# Patient Record
Sex: Female | Born: 1975 | Race: Black or African American | Hispanic: No | Smoking: Current every day smoker
Health system: Southern US, Community
[De-identification: ages and names within clinical notes are randomized; demographics above are authoritative.]

## PROBLEM LIST (undated history)

## (undated) DIAGNOSIS — R519 Headache, unspecified: Secondary | ICD-10-CM

## (undated) DIAGNOSIS — I10 Essential (primary) hypertension: Secondary | ICD-10-CM

## (undated) DIAGNOSIS — F419 Anxiety disorder, unspecified: Secondary | ICD-10-CM

## (undated) DIAGNOSIS — Z9119 Patient's noncompliance with other medical treatment and regimen: Secondary | ICD-10-CM

## (undated) DIAGNOSIS — Z91199 Patient's noncompliance with other medical treatment and regimen due to unspecified reason: Secondary | ICD-10-CM

## (undated) DIAGNOSIS — R51 Headache: Secondary | ICD-10-CM

## (undated) HISTORY — PX: TUBAL LIGATION: SHX77

---

## 2002-04-29 ENCOUNTER — Encounter: Payer: Self-pay | Admitting: *Deleted

## 2002-04-29 ENCOUNTER — Ambulatory Visit (HOSPITAL_COMMUNITY): Admission: RE | Admit: 2002-04-29 | Discharge: 2002-04-29 | Payer: Self-pay | Admitting: *Deleted

## 2002-09-19 ENCOUNTER — Inpatient Hospital Stay (HOSPITAL_COMMUNITY): Admission: AD | Admit: 2002-09-19 | Discharge: 2002-09-21 | Payer: Self-pay | Admitting: *Deleted

## 2002-11-11 ENCOUNTER — Ambulatory Visit (HOSPITAL_COMMUNITY): Admission: RE | Admit: 2002-11-11 | Discharge: 2002-11-11 | Payer: Self-pay | Admitting: Obstetrics and Gynecology

## 2004-02-27 ENCOUNTER — Emergency Department (HOSPITAL_COMMUNITY): Admission: EM | Admit: 2004-02-27 | Discharge: 2004-02-28 | Payer: Self-pay | Admitting: *Deleted

## 2005-05-26 ENCOUNTER — Emergency Department (HOSPITAL_COMMUNITY): Admission: EM | Admit: 2005-05-26 | Discharge: 2005-05-26 | Payer: Self-pay | Admitting: Emergency Medicine

## 2006-04-18 ENCOUNTER — Emergency Department (HOSPITAL_COMMUNITY): Admission: EM | Admit: 2006-04-18 | Discharge: 2006-04-18 | Payer: Self-pay | Admitting: Emergency Medicine

## 2006-08-20 ENCOUNTER — Emergency Department (HOSPITAL_COMMUNITY): Admission: EM | Admit: 2006-08-20 | Discharge: 2006-08-20 | Payer: Self-pay | Admitting: Emergency Medicine

## 2010-07-09 NOTE — Discharge Summary (Signed)
   Briana Soto, Briana Soto                     ACCOUNT NO.:  [id number]   MEDICAL RECORD NO.:  [id number]                   PATIENT TYPE:  INP   LOCATION:  A417                                 FACILITY:  [facility]   PHYSICIAN:  Langley Gauss, M.D.                DATE OF BIRTH:  07/08/1975   DATE OF ADMISSION:  09/19/2002  DATE OF DISCHARGE:  09/21/2002                                 DISCHARGE SUMMARY   DISCHARGE DIAGNOSIS:  1. Term pregnancy delivered.   PROCEDURE:  1. Foley bulb  2. Vaginal delivery on September 20, 2002.   DISPOSITION:  This patient is given standardized discharge instructions with  follow up in the office in 3 weeks time. She is interested in scheduling  permanent sterilization at that time.   DISCHARGE MEDICATIONS:  1. Ambien for therapeutic rest.  2. Darvocet for postpartum cramping.   PERTINENT LABORATORY STUDIES:  Hemoglobin and hematocrit 13.5/38.5 with a  white count of 20.2; this is on postpartum day #1.  Admission hemoglobin  14.6, hematocrit 42.5 with a white count of 8.8.   HOSPITAL COURSE:  See previous dictations.  The patient had Foley bulb  placed on September 19, 2002 for mechanical ripening of the cervix.  She  underwent vaginal delivery on September 20, 2002.  Received during the course of  the labor is IV narcotics only.   Postpartum the patient did well.  She bonded well with the infant, had no  postpartum complications and, likewise, was prepared for discharge on  postpartum day #1-1/2.  Thus discharged on September 21, 2002.                                               Langley Gauss, M.D.    DC/MEDQ  D:  09/22/2002  T:  09/23/2002  Job:  [id number]

## 2010-07-09 NOTE — Op Note (Signed)
NAMELAMIS, BEHRMANN NO.:  [id number]   MEDICAL RECORD NO.:  [id number]                  PATIENT TYPE:   LOCATION:                                       FACILITY:   PHYSICIAN:  Langley Gauss, M.D.                DATE OF BIRTH:   DATE OF PROCEDURE:  09/20/2002  DATE OF DISCHARGE:                                 OPERATIVE REPORT   INDICATIONS FOR PROCEDURE:  A 38-week intrauterine pregnancy for induction  of labor.   DELIVERY:  Spontaneous assisted vaginal delivery of a 7 pound 0 ounce female  infant delivered over an intact perineum with no lacerations performed.  Delivery performed by Langley Gauss, M.D.   ESTIMATED BLOOD LOSS:  Less than 500 cc.   SPECIMENS:  Arterial cord gas and cord blood to pathology laboratory.  The  placenta was examined and noted to be apparently intact with a three-vessel  umbilical cord.   FINDINGS AT THE TIME OF DELIVERY:  A compound presentation with the right  hand noted to be adjacent to the presenting vertex.  In addition, a loose  nuchal cord x1 with compression during the second stage of labor was reduced  prior to delivery of the infant.   ANALGESIA FOR DELIVERY:  The patient received only IV narcotics during the  course of labor.   SUMMARY:  The patient admitted for induction of labor.  Foley bulb initially  placed.  After removal of the Foley bulb catheter, the patient was noted to  be 3 cm dilated per examination by the nursing staff.  Their initial  impression was that spontaneous rupture of membranes had occured at the time  of removal of the Foley bulb; however, no further leakage of fluid was  identified.  Pitocin induction was thereafter initiated.  When I examined  the patient, she was noted to have bulging, intact membranes at 6 cm  dilatation.  Amniotomy was performed at that time, with the findings of  clear amniotic fluid, and a fetal scalp electrode was placed.  The patient  had a  reassuring fetal heart rate.  She did receive only IV narcotics during  the course of labor.  At 8 cm dilation and in the transitional phase of  labor, the patient had intermittent moderate variable decelerations.  She  thereafter progressed rapidly to complete dilatation and began having an  urge to push.  She was placed in the dorsal lithotomy position and prepped  and draped in the usual sterile manner.  She pushed very well during this  short second stage of labor, delivering in a direct OA position with a  compound presentation as previously noted, the right arm being adjacent to  the head with the hand adjacent to the fetal head.  Delivery itself was  atraumatic, with no apparent injury to the arm or hand.  The vertex  delivered in a direct OA position.  The mouth  and nose were bulb suctioned  of clear amniotic fluid.  The right arm delivered at the same time.  Spontaneous rotation occured to a right anterior shoulder position.  Gentle  downward traction combined with good expulsive efforts resulted in easy  delivery of this shoulder as well as the remainder of the infant without  difficulty.  The umbilical cord was milked towards the infant.  The cord was  doubly clamped and cut, and the infant was taken to the nursery table for  immediate assessment.  The infant did initially require some bag and mask  ventilation and resuscitative efforts per the nursing staff.  Arterial cord  gas and cord blood were obtained from the placenta.  Gentle traction on the  umbilical cord resulted in separation, which upon examined appeared to be an  intact placenta with associated three-vessel umbilical cord.  Examination of  the genital tract revealed no lacerations.  Specifically, the perineum was  noted to be intact.  Excellent uterine tone was achieved  immediately following delivery of the placenta, with mother and infant doing  well following delivery.  The infant had been weighed at 7 pounds 0  ounces.  After initial resuscitative efforts, the infant did very well.  The patient  had been tested during the prenatal course.  She was noted to be negative  for group B strep carrier status.                                               Langley Gauss, M.D.    DC/MEDQ  D:  09/20/2002  T:  09/20/2002  Job:  [id number]

## 2010-07-09 NOTE — H&P (Signed)
   [name], [name] NO.:  [id number]   MEDICAL RECORD NO.:  [id number]                  PATIENT TYPE:   LOCATION:                                       FACILITY:   PHYSICIAN:  Tilda Burrow, M.D.              DATE OF BIRTH:   DATE OF ADMISSION:  11/11/2002  DATE OF DISCHARGE:                                HISTORY & PHYSICAL   ADMISSION DIAGNOSIS:  Desire for elective permanent sterilization.   HISTORY OF PRESENT ILLNESS:  This 35 year old female, G4, P4, now five weeks  since her most recent vaginal delivery has recently transferred Soto our  practice and requests permanent sterilization.  The patient has been seen in  our office on October 16, 2002, after having delivered on September 20, 2002, at  [facility].  She has requested permanent sterilization and desires  Soto proceed at this time with permanent technique.  Failure rate of 1/100 has  been reviewed with the patient previously being cared for Dr. Lisette Grinder and  listing herself as referred here by Dr. Lisette Grinder.  Permanency of procedure is  clearly defined Soto the patient who acknowledges this with a failure rate of  1/100 quoted Soto patient.  Tubal sterilization form signed September 25, 2002.   PAST MEDICAL HISTORY:  Mild postpartum hypertension which resolved after  observation.  She was offered Diazide diuretic and declined the offer in  August, and it has now spontaneously resolved.   PAST SURGICAL HISTORY:  Negative.   ALLERGIES:  CODEINE causes nausea.   MEDICATIONS:  None at present.   PHYSICAL EXAMINATION:  VITAL SIGNS:  Height 5 feet 3 inches, weight 131,  blood pressure 118/64, pulse 72.  GENERAL:  Healthy, slim, African-American female, alert and oriented x3.  HEENT:  Pupils equal round and reactive Soto light.  Extraocular movements  intact.  NECK:  Supple.  Trachea midline.  CHEST:  Clear Soto auscultation.  ABDOMEN:  Slim without masses.  GENITALIA:  External genitalia shows  normal multiparous female.  Normal  cervix.  Multiparous uterus, anterior and nontender.  Normal size, shape and  contour.  Adnexa negative for masses.  GC and Chlamydia performed.   PLAN:  Laparoscopic tubal sterilization on November 11, 2002.                                               Tilda Burrow, M.D.    JVF/MEDQ  D:  11/05/2002  T:  11/05/2002  Job:  [id number]

## 2010-07-09 NOTE — Op Note (Signed)
   NAME:  ELEXUS, BARMAN                     ACCOUNT NO.:  [id number]   MEDICAL RECORD NO.:  [id number]                   PATIENT TYPE:  INP   LOCATION:  LDR2                                 FACILITY:  [facility]   PHYSICIAN:  Langley Gauss, M.D.                DATE OF BIRTH:  08-Nov-1975   DATE OF PROCEDURE:  09/19/2002  DATE OF DISCHARGE:                                  PROCEDURE NOTE   PROCEDURE:  Placement of Foley bulb for mechanical ripening of cervix.   DESCRIPTION OF PROCEDURE:  Appropriate informed consent obtained.  Patient  placed in the dorsal lithotomy position.  Nonstress test was noted to be  reactive.  Rare uterine contractions were identified.  Sterile speculum  examination revealed the cervix to be without any active bleeding or any  leakage of fluid.  Digital examination reveals cervix to be 3 cm dilated and  -3 station, 60% effaced, posterior with the vertex well-applied to the  cervix.  A 16 French Foley catheter is passed through the endocervical os on  the first attempt without difficulty and then inflated to a volume of 60 mL  of sterile normal saline.  It is then left in place.  The sterile speculum  is removed and the catheter is left in place.  No traction is applied.  The  fetal heart rate following the procedure remains reassuring.  No change in  the uterine tone.  No evidence of any change in the uterine contractions.  No leakage of fluid or vaginal bleeding.  Nonstress test currently in  progress.                                               Langley Gauss, M.D.    DC/MEDQ  D:  09/19/2002  T:  09/20/2002  Job:  [id number]

## 2010-07-09 NOTE — H&P (Signed)
Briana Soto, [name]                     ACCOUNT NO.:  [id number]   MEDICAL RECORD NO.:  [id number]                   PATIENT TYPE:  INP   LOCATION:  A417                                 FACILITY:  [facility]   PHYSICIAN:  Langley Gauss, M.D.                DATE OF BIRTH:  [birth year]   DATE OF ADMISSION:  09/19/2002  DATE OF DISCHARGE:  09/21/2002                                HISTORY & PHYSICAL   HISTORY OF PRESENT ILLNESS:  This is a 35 year old gravida 4, para 3, at 38+  weeks gestation who is admitted for induction of labor.  The patient is  noted to have a history of three prior rapid labor and deliveries with very  short second stage of labor.  The patient is noted to have significant  fundal lag during this pregnancy as well as weight loss during the first  trimester.  The patient's prenatal course has been uncomplicated.  She was  noted to be AB-positive blood type, antibody screen negative, AFB triple  screen within normal limits, glucose tolerance test is not performed as with  three previous pregnancies GTT was within normal limits.  The patient has  described good fetal growth and intermittent uterine contractions during the  past week.   PAST MEDICAL HISTORY:  No known drug allergies.   CURRENT MEDICATIONS:  Prenatal vitamins only.   OBSTETRICAL HISTORY:  Three prior vaginal deliveries without complications.  Very short second stage of labor.   Other past medical history and surgical history is negative.   PHYSICAL EXAMINATION:  GENERAL:  No acute distress.  Black female.  VITAL SIGNS:  Height 5 feet 4, prepregnancy weight 145.  101, 20, blood  pressure 129/76.  HEENT:  Negative.  No adenopathy.  NECK:  Supple.  Thyroid is not palpable.  LUNGS:  Clear.  CARDIOVASCULAR:  Regular rate and rhythm.  ABDOMEN:  Soft and nontender.  No surgical scars are identified.  Fundal  height is 34 cm.  Vertex presentation by [employer].  EXTREMITIES:  Noted to be  within normal limits.  PELVIC:  Normal external genitalia.  No lesions or ulcerations identified.  Cervix on initial examination 3 cm dilated, -3 station, 60% effaced, and  vertex presentation.  External fetal monitor reveals no uterine  contractions.  Fetal heart rate is reaffirmed with fetal heart rate  accelerations noted, normal long-term variability, and no decelerations  noted.   ASSESSMENT:  Gravida 4, para 3, with history of rapid labor, dilated to 3  but vertex remains unengaged.    PLAN:  Thus, the plan is to proceed with Foley bulb placement for mechanical  ripening of the cervix after which time hopefully with removal there will be  a descent to the vertex and continued softening of the cervix at which time  we can proceed with amniotomy and initiate Pitocin as clinically indicated.  Langley Gauss, M.D.    DC/MEDQ  D:  09/22/2002  T:  09/22/2002  Job:  [id number]

## 2010-07-09 NOTE — Op Note (Signed)
Briana Soto, Briana Soto                     ACCOUNT NO.:  [id number]   MEDICAL RECORD NO.:  [id number]                   PATIENT TYPE:  AMB   LOCATION:  DAY                                  FACILITY:  [facility]   PHYSICIAN:  Tilda Burrow, M.D.              DATE OF BIRTH:  [birth year]   DATE OF PROCEDURE:  11/11/2002  DATE OF DISCHARGE:  11/11/2002                                 OPERATIVE REPORT   PREOPERATIVE DIAGNOSIS:  Elective sterilization.   POSTOPERATIVE DIAGNOSIS:  Elective sterilization.   OPERATION PERFORMED:  Laparoscopic tubal sterilization with Falope rings.   SURGEON:  Tilda Burrow, M.D.   ASSISTANT:  None.   ANESTHESIA:  General.   COMPLICATIONS:  None.   ESTIMATED BLOOD LOSS:  Minimal.   INDICATION:  Elective permanent sterilization.   DETAILS OF PROCEDURE:  The patient was taken to the operating room, prepped  and  draped for a combined abdominal and vaginal procedure, with Hulka  tenaculum attached to the cervix for uterine  manipulation. Bladder in-and-  out catheterization. An infraumbilical, 1 cm vertical incision, as well as a  transverse suprapubic 1 cm incision. Veress needle was used to introduce  pneumoperitoneum through the umbilical incision with the pneumoperitoneum  easily introduced under 10 mmHg of pressure. Introduction of the Veress  needle was done, carefully elevating the abdominal wall and orienting the  needle toward the pelvis.   The laparoscopic trocar was then carefully introduced into the abdomen using  a similar technique, and the laparoscope was used to visualize normal pelvic  anatomy with no evidence of bleeding or trauma. The suprapubic trocar was  introduced under direct visualization, and then attention was directed to  the left fallopian tube, which was identified up to its fimbriated end,  elevated and a mid-segment loop of the tube was drawn up into the Falope  ring applier, Marcaine 0.25% applied to the surface  of the tube and the  Falope ring applied, inspected, and found to be in satisfactory position.  The opposite tube was then treated in a similar fashion. The mesosalpinx  beneath the Falope ring on each side was then infiltrated with approximately  3 cc of Marcaine 0.25%, using a transabdominal approach with a 22-gauge  spinal needle. Then the laparoscopic equipment was removed after instilling  200 cc of saline into  the abdomen and deflating the abdomen. Subcuticular 4-0 Dexon was used to  close the skin and Steri-Strips were placed on the skin surface. Sponge and  needle counts were correct. The patient tolerated the procedure well, was  awakened, and went to the recovery room in good condition.      ___________________________________________                                            Cyndi Lennert  Emelda Fear, M.D.   JVF/MEDQ  D:  01/05/2003  T:  01/05/2003  Job:  [id number]

## 2010-12-08 LAB — BASIC METABOLIC PANEL
BUN: 7
Calcium: 9.3
Creatinine, Ser: 0.81
GFR calc non Af Amer: >60
Glucose, Bld: 90
Potassium: 4.3

## 2010-12-08 LAB — CBC
HCT: 43.5
Platelets: 254
RDW: 12
WBC: 7.8

## 2010-12-08 LAB — WET PREP, GENITAL: Yeast Wet Prep HPF POC: NONE SEEN

## 2010-12-08 LAB — GC/CHLAMYDIA PROBE AMP, GENITAL
Chlamydia, DNA Probe: POSITIVE — AB
GC Probe Amp, Genital: POSITIVE — AB

## 2010-12-08 LAB — DIFFERENTIAL
Basophils Absolute: 0
Eosinophils Relative: 2
Lymphocytes Relative: 37
Neutro Abs: 4.2

## 2010-12-08 LAB — PREGNANCY, URINE: Preg Test, Ur: NEGATIVE

## 2012-09-29 DIAGNOSIS — Z720 Tobacco use: Secondary | ICD-10-CM | POA: Insufficient documentation

## 2012-09-29 DIAGNOSIS — I10 Essential (primary) hypertension: Secondary | ICD-10-CM | POA: Insufficient documentation

## 2012-09-29 DIAGNOSIS — F419 Anxiety disorder, unspecified: Secondary | ICD-10-CM | POA: Insufficient documentation

## 2014-09-07 ENCOUNTER — Encounter (HOSPITAL_COMMUNITY): Payer: Self-pay | Admitting: Emergency Medicine

## 2014-09-07 ENCOUNTER — Emergency Department (HOSPITAL_COMMUNITY)
Admission: EM | Admit: 2014-09-07 | Discharge: 2014-09-07 | Disposition: A | Discharge status: Home / Self Care | Payer: Medicaid Other | Attending: Emergency Medicine | Admitting: Emergency Medicine

## 2014-09-07 DIAGNOSIS — B86 Scabies: Secondary | ICD-10-CM | POA: Diagnosis not present

## 2014-09-07 DIAGNOSIS — Z720 Tobacco use: Secondary | ICD-10-CM | POA: Insufficient documentation

## 2014-09-07 DIAGNOSIS — I10 Essential (primary) hypertension: Secondary | ICD-10-CM | POA: Insufficient documentation

## 2014-09-07 DIAGNOSIS — R21 Rash and other nonspecific skin eruption: Secondary | ICD-10-CM | POA: Diagnosis present

## 2014-09-07 HISTORY — DX: Essential (primary) hypertension: I10

## 2014-09-07 MED ORDER — AMLODIPINE BESYLATE 5 MG PO TABS: 5.0000 mg | ORAL_TABLET | Freq: Every day | ORAL | Status: DC

## 2014-09-07 MED ORDER — HYDROXYZINE HCL 25 MG PO TABS: 25.0000 mg | ORAL_TABLET | Freq: Four times a day (QID) | ORAL | Status: DC

## 2014-09-07 MED ORDER — AMLODIPINE BESYLATE 5 MG PO TABS
5.0000 mg | ORAL_TABLET | Freq: Once | ORAL | Status: AC
  Administered 2014-09-07: 5 mg via ORAL
  Filled 2014-09-07: qty 1

## 2014-09-07 MED ORDER — PERMETHRIN 5 % EX CREA: TOPICAL_CREAM | CUTANEOUS | Status: DC

## 2014-09-07 NOTE — Discharge Instructions (Signed)

## 2014-09-07 NOTE — ED Provider Notes (Signed)
CSN: [id number]     Arrival date & time 09/07/14  1551 History   This chart was scribed for [name], PA-C working with [name], MD by Briana Soto, ED Scribe. This patient was seen in room APFT21/APFT21 and the patient's care was started at 4:17 PM.   Chief Complaint  Patient presents with  . Rash   The history is provided by the patient. No language interpreter was used.   HPI Comments: Briana Soto is a 39 y.o. female who presents to the Emergency Department complaining of pruritic rash for two weeks now. Patient reports initiation of rash to hands and spreading since presentation to lower legs and ankles. Patient reports irritation of the rash and sensation of biting especially at work where it is warm. Patient reports treatment with Benadryl without relief. Patient reports itching especially at a her job and when leaving and states that she was informed at the site has mites. Patient reports that everyone on the home has similar rash.   Past Medical History  Diagnosis Date  . Hypertension    History reviewed. No pertinent past surgical history. History reviewed. No pertinent family history. History  Substance Use Topics  . Smoking status: Current Every Day Smoker -- 1.00 packs/day    Types: Cigarettes  . Smokeless tobacco: Not on file  . Alcohol Use: Yes     Comment: occasionally   OB History    No data available     Review of Systems  Constitutional: Negative for fever.  Skin: Positive for rash.  All other systems reviewed and are negative.   Allergies  Codeine  Home Medications   Prior to Admission medications   Medication Sig Start Date End Date Taking? Authorizing Provider  diphenhydrAMINE (BENADRYL) 25 MG tablet Take 25 mg by mouth 2 (two) times daily as needed.   Yes Historical Provider, MD  amLODipine (NORVASC) 5 MG tablet Take 1 tablet (5 mg total) by mouth daily. 09/07/14   Hope Bunnie Pion, NP  hydrOXYzine (ATARAX/VISTARIL) 25 MG tablet Take 1 tablet  (25 mg total) by mouth every 6 (six) hours. 09/07/14   Hope Bunnie Pion, NP  permethrin (ELIMITE) 5 % cream Apply to affected area once 09/07/14   [name], NP   Triage Vitals: BP 194/134 mmHg  Pulse 101  Temp(Src) 98.3 F (36.8 C) (Oral)  Resp 14  Ht 5\' 6"  (1.676 m)  Wt 156 lb (70.761 kg)  BMI 25.19 kg/m2  SpO2 100%  LMP 09/01/2014 Physical Exam  Constitutional: She is oriented to person, place, and time. She appears well-developed and well-nourished. No distress.  HENT:  Head: Normocephalic and atraumatic.  Eyes: EOM are normal.  Neck: Neck supple. No tracheal deviation present.  Cardiovascular: Normal rate.   Pulmonary/Chest: Effort normal. No respiratory distress.  Musculoskeletal: Normal range of motion.  Neurological: She is alert and oriented to person, place, and time.  Skin: Skin is warm and dry.  Rash hands, wrists, between fingers and toes.  Psychiatric: She has a normal mood and affect. Her behavior is normal.  Nursing note and vitals reviewed.   ED Course  Procedures (including critical care time)  I discussed this case with Dr. Lacinda Axon and will refill patient's BP medication.   COORDINATION OF CARE: 4:25 PM- Discussed treatment plan with patient at bedside and patient agreed to plan.   4:30 PM Patient BP rechecked and continues to be elevated. Patient with hx of HTN and not taking her BP medication because  she moved to [place] and does not have a PCP her yet. She denies chest pain, shortness of breath, weakness or any other symptoms.  Discussed in detail with the patient problems associated with HTN. Encouraged patient to  follow up with a PCP.   MDM  39 y.o. female with rash and itching x 2 weeks and elevated BP since running out of her medication. Since patient is known hypertensive and without symptoms at this time will refill her medication and she will try to find a PCP in the area to follow her health problems.  Final diagnoses:  Scabies  Essential  hypertension   I personally performed the services described in this documentation, which was scribed in my presence. The recorded information has been reviewed and is accurate.    [address] [place], NP 09/07/14 [place], MD 09/07/14 [phone]

## 2014-09-07 NOTE — ED Notes (Signed)
Pt states that she has been itching and has had a rash for the past 2 weeks.

## 2017-01-25 ENCOUNTER — Encounter (HOSPITAL_COMMUNITY): Payer: Self-pay | Admitting: Emergency Medicine

## 2017-01-25 ENCOUNTER — Emergency Department (HOSPITAL_COMMUNITY): Payer: Self-pay

## 2017-01-25 ENCOUNTER — Other Ambulatory Visit: Payer: Self-pay

## 2017-01-25 ENCOUNTER — Emergency Department (HOSPITAL_COMMUNITY)
Admission: EM | Admit: 2017-01-25 | Discharge: 2017-01-25 | Disposition: A | Discharge status: Home / Self Care | Payer: Self-pay | Attending: Emergency Medicine | Admitting: Emergency Medicine

## 2017-01-25 DIAGNOSIS — N6320 Unspecified lump in the left breast, unspecified quadrant: Secondary | ICD-10-CM

## 2017-01-25 DIAGNOSIS — I10 Essential (primary) hypertension: Secondary | ICD-10-CM | POA: Insufficient documentation

## 2017-01-25 DIAGNOSIS — L0291 Cutaneous abscess, unspecified: Secondary | ICD-10-CM

## 2017-01-25 DIAGNOSIS — Z79899 Other long term (current) drug therapy: Secondary | ICD-10-CM | POA: Insufficient documentation

## 2017-01-25 DIAGNOSIS — N611 Abscess of the breast and nipple: Secondary | ICD-10-CM | POA: Insufficient documentation

## 2017-01-25 DIAGNOSIS — F17210 Nicotine dependence, cigarettes, uncomplicated: Secondary | ICD-10-CM | POA: Insufficient documentation

## 2017-01-25 LAB — URINALYSIS, ROUTINE W REFLEX MICROSCOPIC
BILIRUBIN URINE: NEGATIVE
GLUCOSE, UA: NEGATIVE mg/dL
KETONES UR: NEGATIVE mg/dL
Leukocytes, UA: NEGATIVE
Nitrite: NEGATIVE
PH: 5.5 (ref 5.0–8.0)
Protein, ur: NEGATIVE mg/dL

## 2017-01-25 LAB — CBC WITH DIFFERENTIAL/PLATELET
BASOS ABS: 0 10*3/uL (ref 0.0–0.1)
BASOS PCT: 1 %
EOS ABS: 0.1 10*3/uL (ref 0.0–0.7)
Eosinophils Relative: 1 %
HEMATOCRIT: 42.3 % (ref 36.0–46.0)
HEMOGLOBIN: 13.2 g/dL (ref 12.0–15.0)
Lymphocytes Relative: 27 %
Lymphs Abs: 2.2 10*3/uL (ref 0.7–4.0)
MCH: 29.8 pg (ref 26.0–34.0)
MCHC: 31.2 g/dL (ref 30.0–36.0)
MCV: 95.5 fL (ref 78.0–100.0)
Monocytes Absolute: 0.7 10*3/uL (ref 0.1–1.0)
Monocytes Relative: 8 %
NEUTROS ABS: 5.1 10*3/uL (ref 1.7–7.7)
Neutrophils Relative %: 63 %
Platelets: 362 10*3/uL (ref 150–400)
RBC: 4.43 MIL/uL (ref 3.87–5.11)
RDW: 14 % (ref 11.5–15.5)
WBC: 8.1 10*3/uL (ref 4.0–10.5)

## 2017-01-25 LAB — BASIC METABOLIC PANEL
ANION GAP: 9 (ref 5–15)
BUN: 7 mg/dL (ref 6–20)
CALCIUM: 9.6 mg/dL (ref 8.9–10.3)
CO2: 25 mmol/L (ref 22–32)
CREATININE: 0.84 mg/dL (ref 0.44–1.00)
Chloride: 105 mmol/L (ref 101–111)
GLUCOSE: 85 mg/dL (ref 65–99)
Potassium: 3.2 mmol/L — ABNORMAL LOW (ref 3.5–5.1)
Sodium: 139 mmol/L (ref 135–145)

## 2017-01-25 LAB — URINALYSIS, MICROSCOPIC (REFLEX)

## 2017-01-25 LAB — PREGNANCY, URINE: Preg Test, Ur: NEGATIVE

## 2017-01-25 MED ORDER — AMLODIPINE BESYLATE 5 MG PO TABS
5.0000 mg | ORAL_TABLET | Freq: Every day | ORAL | 0 refills | Status: DC
Start: 2017-01-25 — End: 2017-11-29

## 2017-01-25 MED ORDER — HYDROCODONE-ACETAMINOPHEN 5-325 MG PO TABS
1.0000 | ORAL_TABLET | Freq: Once | ORAL | Status: AC
  Administered 2017-01-25: 1 via ORAL
  Filled 2017-01-25: qty 1

## 2017-01-25 MED ORDER — IBUPROFEN 600 MG PO TABS: 600.0000 mg | ORAL_TABLET | Freq: Four times a day (QID) | ORAL | 0 refills | Status: DC | PRN

## 2017-01-25 MED ORDER — AMLODIPINE BESYLATE 5 MG PO TABS
5.0000 mg | ORAL_TABLET | Freq: Once | ORAL | Status: AC
  Administered 2017-01-25: 5 mg via ORAL
  Filled 2017-01-25: qty 1

## 2017-01-25 MED ORDER — CLINDAMYCIN PHOSPHATE 600 MG/50ML IV SOLN
600.0000 mg | Freq: Once | INTRAVENOUS | Status: AC
  Administered 2017-01-25: 600 mg via INTRAVENOUS
  Filled 2017-01-25: qty 50

## 2017-01-25 MED ORDER — ONDANSETRON 8 MG PO TBDP
8.0000 mg | ORAL_TABLET | Freq: Once | ORAL | Status: AC
  Administered 2017-01-25: 8 mg via ORAL
  Filled 2017-01-25: qty 1

## 2017-01-25 MED ORDER — CLINDAMYCIN HCL 150 MG PO CAPS: 300.0000 mg | ORAL_CAPSULE | Freq: Three times a day (TID) | ORAL | 0 refills | Status: DC

## 2017-01-25 MED ORDER — AMLODIPINE BESYLATE 5 MG PO TABS: 5.0000 mg | ORAL_TABLET | Freq: Every day | ORAL | 0 refills | Status: DC

## 2017-01-25 NOTE — ED Notes (Signed)
Pt taken to us12

## 2017-01-25 NOTE — ED Triage Notes (Signed)
Abscess to left breast x 3 days.

## 2017-01-25 NOTE — Discharge Instructions (Signed)
Warm compresses on/off to your breast.  Call the health dept listed to arrange a follow-up appt regarding your high blood pressure and to schedule a very important diagnostic mammogram.  Return here in 1-2 days if the redness and pain to your breast is not iproving

## 2017-01-25 NOTE — ED Notes (Signed)
Advised patient she did not obtain enough urine specimen.  Advised her we still needed a urine specimen.

## 2017-01-26 NOTE — ED Provider Notes (Signed)
[facility] EMERGENCY DEPARTMENT Provider Note   CSN: [id number] Arrival date & time: 01/25/17  1108     History   Chief Complaint Chief Complaint  Patient presents with  . Abscess    HPI Briana Soto is a 41 y.o. female.  HPI  Briana Soto is a 41 y.o. female who presents to the Emergency Department complaining of pain, redness and swelling to the left breast.  Symptoms present for 3 days.  She noticed redness and pain at the left areola that has spread.  No drainage.  Pain associated with palpation.  She denies fever, chills, lactation.  She also reports noticing a nontender mass to the lateral left breast that has been present for approximately 3 years.  She has not had this evaluated.  Denies family hx of breast CA, no previous mammogram, does not take birth control.    Pt is a smoker.      Past Medical History:  Diagnosis Date  . Hypertension     There are no active problems to display for this patient.   History reviewed. No pertinent surgical history.  OB History    No data available       Home Medications    Prior to Admission medications   Medication Sig Start Date End Date Taking? Authorizing Provider  diphenhydrAMINE (BENADRYL) 25 MG tablet Take 25 mg by mouth 2 (two) times daily as needed.   Yes [provider]  amLODipine (NORVASC) 5 MG tablet Take 1 tablet (5 mg total) by mouth daily. 01/25/17   Eldra Word, PA-C  clindamycin (CLEOCIN) 150 MG capsule Take 2 capsules (300 mg total) by mouth 3 (three) times daily. For 7 days 01/25/17   Briell Paulette, PA-C  hydrOXYzine (ATARAX/VISTARIL) 25 MG tablet Take 1 tablet (25 mg total) by mouth every 6 (six) hours. Patient not taking: Reported on 01/25/2017 09/07/14   [name], NP  ibuprofen (ADVIL,MOTRIN) 600 MG tablet Take 1 tablet (600 mg total) by mouth every 6 (six) hours as needed. Take with food 01/25/17   Lavaeh Bau, PA-C  permethrin (ELIMITE) 5 % cream Apply to affected  area once Patient not taking: Reported on 01/25/2017 09/07/14   [name], NP    Family History No family history on file.  Social History Social History   Tobacco Use  . Smoking status: Current Every Day Smoker    Packs/day: 1.00    Types: Cigarettes  Substance Use Topics  . Alcohol use: Yes    Comment: occasionally  . Drug use: No     Allergies   Codeine   Review of Systems Review of Systems  Constitutional: Negative for chills and fever.  Respiratory: Negative for chest tightness and shortness of breath.   Gastrointestinal: Negative for nausea and vomiting.  Musculoskeletal: Negative for arthralgias and joint swelling.  Skin: Positive for color change.       Redness, pain left breast  Hematological: Negative for adenopathy.  All other systems reviewed and are negative.    Physical Exam Updated Vital Signs BP (!) 200/126   Pulse 78   Temp 98.2 F (36.8 C)   Resp 17   Ht 5\' 4"  (1.626 m)   Wt 55.3 kg (122 lb)   LMP 01/25/2017   SpO2 99%   BMI 20.94 kg/m   Physical Exam  Constitutional: She is oriented to person, place, and time. She appears well-developed and well-nourished. No distress.  HENT:  Head: Normocephalic and atraumatic.  Cardiovascular: Normal rate, regular rhythm and normal heart sounds.  No murmur heard. Pulmonary/Chest: Effort normal and breath sounds normal. No respiratory distress.  Focal erythema and induration of the left areola. No drainage or fluctuance.  Pt also noted to have a non-tender 3 cm firm mobile mass to the lateral left breast.    Abdominal: Soft. There is no tenderness.  Musculoskeletal: Normal range of motion.  Lymphadenopathy:    She has no cervical adenopathy.       Left axillary: No pectoral and no lateral adenopathy present. Neurological: She is alert and oriented to person, place, and time. She exhibits normal muscle tone. Coordination normal.  Skin: Skin is warm and dry. Capillary refill takes less than 2  seconds. No rash noted.  Psychiatric: She has a normal mood and affect.  Nursing note and vitals reviewed.    ED Treatments / Results  Labs (all labs ordered are listed, but only abnormal results are displayed) Labs Reviewed  BASIC METABOLIC PANEL - Abnormal; Notable for the following components:      Result Value   Potassium 3.2 (*)    All other components within normal limits  URINALYSIS, ROUTINE W REFLEX MICROSCOPIC - Abnormal; Notable for the following components:   Color, Urine STRAW (*)    Specific Gravity, Urine <1.005 (*)    Hgb urine dipstick LARGE (*)    All other components within normal limits  URINALYSIS, MICROSCOPIC (REFLEX) - Abnormal; Notable for the following components:   Bacteria, UA RARE (*)    Squamous Epithelial / LPF 0-5 (*)    All other components within normal limits  CBC WITH DIFFERENTIAL/PLATELET  PREGNANCY, URINE    EKG  EKG Interpretation None       Radiology US Breast Ltd Uni Left Inc Axilla  Result Date: 01/25/2017 CLINICAL DATA:  41 year old female with focal left breast pain, redness and occasional drainage. EXAM: ULTRASOUND OF THE LEFT BREAST COMPARISON:  None FINDINGS: Targeted ultrasound is performed, showing a 2.3 x 2.1 x 2.3 cm complex cystic structure/collection at the 3 o'clock position of the retroareolar left breast, likely representing an abscess. IMPRESSION: 2.3 x 2.1 x 2.3 cm complex cystic structure/collection in the outer retroareolar left breast -likely representing an abscess. Clinical follow-up and left breast ultrasound follow-up in 7-10 days recommended following appropriate treatment. Bilateral diagnostic mammograms at that time also recommended if not recently performed. RECOMMENDATION: Left breast ultrasound and bilateral diagnostic mammograms (if not recently performed) in 7-10 days. Electronically Signed   By: Margarette Canada M.D.   On: 01/25/2017 12:57    Procedures Procedures (including critical care time)  Medications  Ordered in ED Medications  HYDROcodone-acetaminophen (NORCO/VICODIN) 5-325 MG per tablet 1 tablet (1 tablet Oral Given 01/25/17 1250)  ondansetron (ZOFRAN-ODT) disintegrating tablet 8 mg (8 mg Oral Given 01/25/17 1250)  clindamycin (CLEOCIN) IVPB 600 mg (0 mg Intravenous Stopped 01/25/17 1513)  amLODipine (NORVASC) tablet 5 mg (5 mg Oral Given 01/25/17 1512)     Initial Impression / Assessment and Plan / ED Course  I have reviewed the triage vital signs and the nursing notes.  Pertinent labs & imaging results that were available during my care of the patient were reviewed by me and considered in my medical decision making (see chart for details).     Pt well appearing.  Non-toxic.  Pt hypertensive during ED stay.  Has hx of HTN, but has not taken BP medications in 2 years due to lack of funds and no PCP.  Denies sx's other than breast pain  I have discussed at length the importance of further eval of breast mass and need for diagnostic mammo.  Pt given references for local GYN and PCP to establish care.  I have also advised her to contact [facility] to arrange assistance with getting mammo and ER return in 2 days for recheck of abscess.  Pt agrees to plan.  Will start her back on her Norvasc.    Final Clinical Impressions(s) / ED Diagnoses   Final diagnoses:  Abscess  Abscess of breast  Mass of breast, left  Essential hypertension    ED Discharge Orders        Ordered    clindamycin (CLEOCIN) 150 MG capsule  3 times daily,   Status:  Discontinued     01/25/17 1539    ibuprofen (ADVIL,MOTRIN) 600 MG tablet  Every 6 hours PRN,   Status:  Discontinued     01/25/17 1539    amLODipine (NORVASC) 5 MG tablet  Daily,   Status:  Discontinued     01/25/17 1539    amLODipine (NORVASC) 5 MG tablet  Daily     01/25/17 1549    clindamycin (CLEOCIN) 150 MG capsule  3 times daily     01/25/17 1549    ibuprofen (ADVIL,MOTRIN) 600 MG tablet  Every 6 hours PRN     01/25/17 1549        Delcia Spitzley, Lynelle Smoke, PA-C 01/26/17 2216    Isla Pence, MD 01/29/17 0800

## 2017-11-29 ENCOUNTER — Observation Stay (HOSPITAL_COMMUNITY)
Admission: EM | Admit: 2017-11-29 | Discharge: 2017-12-01 | Disposition: A | Discharge status: Home / Self Care | Payer: Self-pay | Attending: Internal Medicine | Admitting: Internal Medicine

## 2017-11-29 ENCOUNTER — Other Ambulatory Visit: Payer: Self-pay

## 2017-11-29 ENCOUNTER — Emergency Department (HOSPITAL_COMMUNITY): Payer: Self-pay

## 2017-11-29 ENCOUNTER — Encounter (HOSPITAL_COMMUNITY): Payer: Self-pay | Admitting: Emergency Medicine

## 2017-11-29 DIAGNOSIS — F419 Anxiety disorder, unspecified: Secondary | ICD-10-CM | POA: Insufficient documentation

## 2017-11-29 DIAGNOSIS — F17210 Nicotine dependence, cigarettes, uncomplicated: Secondary | ICD-10-CM | POA: Insufficient documentation

## 2017-11-29 DIAGNOSIS — Z885 Allergy status to narcotic agent status: Secondary | ICD-10-CM | POA: Insufficient documentation

## 2017-11-29 DIAGNOSIS — R000 Tachycardia, unspecified: Secondary | ICD-10-CM | POA: Diagnosis present

## 2017-11-29 DIAGNOSIS — I161 Hypertensive emergency: Secondary | ICD-10-CM

## 2017-11-29 DIAGNOSIS — E876 Hypokalemia: Secondary | ICD-10-CM | POA: Diagnosis present

## 2017-11-29 DIAGNOSIS — I160 Hypertensive urgency: Principal | ICD-10-CM | POA: Diagnosis present

## 2017-11-29 DIAGNOSIS — Z9114 Patient's other noncompliance with medication regimen: Secondary | ICD-10-CM

## 2017-11-29 DIAGNOSIS — R251 Tremor, unspecified: Secondary | ICD-10-CM | POA: Insufficient documentation

## 2017-11-29 DIAGNOSIS — Z79899 Other long term (current) drug therapy: Secondary | ICD-10-CM | POA: Insufficient documentation

## 2017-11-29 DIAGNOSIS — Z91120 Patient's intentional underdosing of medication regimen due to financial hardship: Secondary | ICD-10-CM | POA: Insufficient documentation

## 2017-11-29 DIAGNOSIS — I10 Essential (primary) hypertension: Secondary | ICD-10-CM | POA: Insufficient documentation

## 2017-11-29 HISTORY — DX: Patient's noncompliance with other medical treatment and regimen: Z91.19

## 2017-11-29 HISTORY — DX: Headache: R51

## 2017-11-29 HISTORY — DX: Patient's noncompliance with other medical treatment and regimen due to unspecified reason: Z91.199

## 2017-11-29 HISTORY — DX: Headache, unspecified: R51.9

## 2017-11-29 LAB — MAGNESIUM: Magnesium: 2.1 mg/dL (ref 1.7–2.4)

## 2017-11-29 LAB — CBC WITH DIFFERENTIAL/PLATELET
ABS IMMATURE GRANULOCYTES: 0.01 10*3/uL (ref 0.00–0.07)
Basophils Absolute: 0 10*3/uL (ref 0.0–0.1)
Basophils Relative: 1 %
Eosinophils Absolute: 0.1 10*3/uL (ref 0.0–0.5)
Eosinophils Relative: 1 %
HEMATOCRIT: 41.9 % (ref 36.0–46.0)
Hemoglobin: 13 g/dL (ref 12.0–15.0)
IMMATURE GRANULOCYTES: 0 %
LYMPHS ABS: 2.1 10*3/uL (ref 0.7–4.0)
Lymphocytes Relative: 41 %
MCH: 27.5 pg (ref 26.0–34.0)
MCHC: 31 g/dL (ref 30.0–36.0)
MCV: 88.8 fL (ref 80.0–100.0)
MONO ABS: 0.6 10*3/uL (ref 0.1–1.0)
MONOS PCT: 12 %
NEUTROS ABS: 2.4 10*3/uL (ref 1.7–7.7)
NEUTROS PCT: 45 %
Platelets: 334 10*3/uL (ref 150–400)
RBC: 4.72 MIL/uL (ref 3.87–5.11)
RDW: 15.1 % (ref 11.5–15.5)
WBC: 5.2 10*3/uL (ref 4.0–10.5)
nRBC: 0 % (ref 0.0–0.2)

## 2017-11-29 LAB — BASIC METABOLIC PANEL
Anion gap: 9 (ref 5–15)
BUN: 7 mg/dL (ref 6–20)
CALCIUM: 8.6 mg/dL — AB (ref 8.9–10.3)
CHLORIDE: 108 mmol/L (ref 98–111)
CO2: 24 mmol/L (ref 22–32)
CREATININE: 0.74 mg/dL (ref 0.44–1.00)
GFR calc non Af Amer: >60 mL/min (ref >60)
Glucose, Bld: 83 mg/dL (ref 70–99)
Potassium: 3.3 mmol/L — ABNORMAL LOW (ref 3.5–5.1)
SODIUM: 141 mmol/L (ref 135–145)

## 2017-11-29 LAB — URINALYSIS, ROUTINE W REFLEX MICROSCOPIC
Bilirubin Urine: NEGATIVE
GLUCOSE, UA: NEGATIVE mg/dL
HGB URINE DIPSTICK: NEGATIVE
Ketones, ur: NEGATIVE mg/dL
LEUKOCYTES UA: NEGATIVE
Nitrite: NEGATIVE
PROTEIN: NEGATIVE mg/dL
Specific Gravity, Urine: 1.002 — ABNORMAL LOW (ref 1.005–1.030)
pH: 5 (ref 5.0–8.0)

## 2017-11-29 LAB — PREGNANCY, URINE: Preg Test, Ur: NEGATIVE

## 2017-11-29 LAB — TSH: TSH: 0.565 u[IU]/mL (ref 0.350–4.500)

## 2017-11-29 LAB — TROPONIN I: Troponin I: <0.03 ng/mL (ref <0.03)

## 2017-11-29 LAB — BRAIN NATRIURETIC PEPTIDE: B NATRIURETIC PEPTIDE 5: 48 pg/mL (ref 0.0–100.0)

## 2017-11-29 MED ORDER — NICOTINE 21 MG/24HR TD PT24
21.0000 mg | MEDICATED_PATCH | Freq: Every day | TRANSDERMAL | Status: DC | PRN
  Administered 2017-11-29 – 2017-12-01 (×2): 21 mg via TRANSDERMAL
  Filled 2017-11-29 (×2): qty 1

## 2017-11-29 MED ORDER — DIPHENHYDRAMINE HCL 25 MG PO TABS
25.0000 mg | ORAL_TABLET | Freq: Two times a day (BID) | ORAL | Status: DC | PRN
  Administered 2017-11-29: 25 mg via ORAL
  Filled 2017-11-29 (×3): qty 1

## 2017-11-29 MED ORDER — ACETAMINOPHEN 325 MG PO TABS
650.0000 mg | ORAL_TABLET | Freq: Four times a day (QID) | ORAL | Status: DC | PRN
  Administered 2017-11-30 (×2): 650 mg via ORAL
  Filled 2017-11-29 (×2): qty 2

## 2017-11-29 MED ORDER — SODIUM CHLORIDE 0.9% FLUSH: 3.0000 mL | INTRAVENOUS | Status: DC | PRN

## 2017-11-29 MED ORDER — SODIUM CHLORIDE 0.9 % IV SOLN: 250.0000 mL | INTRAVENOUS | Status: DC | PRN

## 2017-11-29 MED ORDER — HYDROCHLOROTHIAZIDE 12.5 MG PO CAPS
12.5000 mg | ORAL_CAPSULE | Freq: Every day | ORAL | Status: DC
  Administered 2017-11-29 – 2017-11-30 (×2): 12.5 mg via ORAL
  Filled 2017-11-29 (×2): qty 1

## 2017-11-29 MED ORDER — ENOXAPARIN SODIUM 40 MG/0.4ML ~~LOC~~ SOLN
40.0000 mg | SUBCUTANEOUS | Status: DC
  Administered 2017-11-29 – 2017-11-30 (×2): 40 mg via SUBCUTANEOUS
  Filled 2017-11-29 (×2): qty 0.4

## 2017-11-29 MED ORDER — SODIUM CHLORIDE 0.9% FLUSH
3.0000 mL | Freq: Two times a day (BID) | INTRAVENOUS | Status: DC
  Administered 2017-11-30 (×2): 3 mL via INTRAVENOUS

## 2017-11-29 MED ORDER — LABETALOL HCL 200 MG PO TABS
100.0000 mg | ORAL_TABLET | Freq: Two times a day (BID) | ORAL | Status: DC
  Administered 2017-11-29 – 2017-12-01 (×4): 100 mg via ORAL
  Filled 2017-11-29 (×4): qty 1

## 2017-11-29 MED ORDER — POTASSIUM CHLORIDE CRYS ER 20 MEQ PO TBCR
20.0000 meq | EXTENDED_RELEASE_TABLET | Freq: Once | ORAL | Status: AC
  Administered 2017-11-29: 20 meq via ORAL
  Filled 2017-11-29: qty 1

## 2017-11-29 MED ORDER — HYDRALAZINE HCL 20 MG/ML IJ SOLN
10.0000 mg | Freq: Four times a day (QID) | INTRAMUSCULAR | Status: DC | PRN
  Administered 2017-11-30 – 2017-12-01 (×2): 10 mg via INTRAVENOUS
  Filled 2017-11-29 (×2): qty 1

## 2017-11-29 MED ORDER — LABETALOL HCL 5 MG/ML IV SOLN
10.0000 mg | Freq: Once | INTRAVENOUS | Status: AC
  Administered 2017-11-29: 10 mg via INTRAVENOUS
  Filled 2017-11-29: qty 4

## 2017-11-29 MED ORDER — ACETAMINOPHEN 650 MG RE SUPP: 650.0000 mg | Freq: Four times a day (QID) | RECTAL | Status: DC | PRN

## 2017-11-29 NOTE — ED Provider Notes (Signed)
[facility] EMERGENCY DEPARTMENT Provider Note   CSN: [id number] Arrival date & time: 11/29/17  1828     History   Chief Complaint Chief Complaint  Patient presents with  . Hypertension    HPI Briana Soto is a 42 y.o. female.  HPI Pt was seen at 2005. Per pt and her family, c/o gradual onset and persistence of "not feeling well" for the past several months, worse over the past week. Pt cannot be more specific regarding her complaint. Pt states she has been out of her BP meds "for at least a year." Pt states her "feet are swollen."  Pt states she was evaluated by a doctor this morning "to go over my mammogram" and was told her BP was very high. Pt denies CP/SOB, no abd pain, no N/V/D, no fevers, no rash, no focal motor weakness, no tingling/numbness in extremities, no visual changes.   Past Medical History:  Diagnosis Date  . Headache   . Hypertension   . Noncompliance     There are no active problems to display for this patient.   Past Surgical History:  Procedure Laterality Date  . TUBAL LIGATION       OB History   None      Home Medications    Prior to Admission medications   Medication Sig Start Date End Date Taking? Authorizing Provider  amLODipine (NORVASC) 5 MG tablet Take 1 tablet (5 mg total) by mouth daily. 01/25/17   Triplett, Tammy, PA-C  clindamycin (CLEOCIN) 150 MG capsule Take 2 capsules (300 mg total) by mouth 3 (three) times daily. For 7 days 01/25/17   Kem Parkinson, PA-C  diphenhydrAMINE (BENADRYL) 25 MG tablet Take 25 mg by mouth 2 (two) times daily as needed.    [provider]  hydrOXYzine (ATARAX/VISTARIL) 25 MG tablet Take 1 tablet (25 mg total) by mouth every 6 (six) hours. Patient not taking: Reported on 01/25/2017 09/07/14   [name], NP  ibuprofen (ADVIL,MOTRIN) 600 MG tablet Take 1 tablet (600 mg total) by mouth every 6 (six) hours as needed. Take with food 01/25/17   Triplett, Tammy, PA-C  permethrin (ELIMITE) 5 %  cream Apply to affected area once Patient not taking: Reported on 01/25/2017 09/07/14   [name], NP    Family History History reviewed. No pertinent family history.  Social History Social History   Tobacco Use  . Smoking status: Current Every Day Smoker    Packs/day: 1.00    Types: Cigarettes  . Smokeless tobacco: Never Used  Substance Use Topics  . Alcohol use: Yes    Comment: at least 2 beers daily  . Drug use: No     Allergies   Codeine   Review of Systems Review of Systems ROS: Statement: All systems negative except as marked or noted in the HPI; Constitutional: Negative for fever and chills. ; ; Eyes: Negative for eye pain, redness and discharge. ; ; ENMT: Negative for ear pain, hoarseness, nasal congestion, sinus pressure and sore throat. ; ; Cardiovascular: Negative for chest pain, palpitations, diaphoresis, dyspnea and peripheral edema. ; ; Respiratory: Negative for cough, wheezing and stridor. ; ; Gastrointestinal: Negative for nausea, vomiting, diarrhea, abdominal pain, blood in stool, hematemesis, jaundice and rectal bleeding. . ; ; Genitourinary: Negative for dysuria, flank pain and hematuria. ; ; Musculoskeletal: Negative for back pain and neck pain. Negative for swelling and trauma.; ; Skin: Negative for pruritus, rash, abrasions, blisters, bruising and skin lesion.; ; Neuro: Negative  for headache, lightheadedness and neck stiffness. Negative for weakness, altered level of consciousness, altered mental status, extremity weakness, paresthesias, involuntary movement, seizure and syncope.       Physical Exam Updated Vital Signs BP (!) 211/128 (BP Location: Right Arm)   Pulse (!) 102   Temp 98.2 F (36.8 C) (Oral)   Resp 16   Ht 5\' 6"  (1.676 m)   Wt 56.7 kg   LMP 11/02/2017   SpO2 96%   BMI 20.18 kg/m   Physical Exam 2010: Physical examination:  Nursing notes reviewed; Vital signs and O2 SAT reviewed;  Constitutional: Well developed, Well nourished, Well  hydrated, In no acute distress; Head:  Normocephalic, atraumatic; Eyes: EOMI, PERRL, No scleral icterus. No conjunctival injection. +bilateral periorbital edema.; ENMT: Mouth and pharynx normal, Mucous membranes moist; Neck: Supple, Full range of motion, No lymphadenopathy; Cardiovascular: Regular rate and rhythm, No gallop; Respiratory: Breath sounds clear & equal bilaterally, No wheezes.  Speaking full sentences with ease, Normal respiratory effort/excursion; Chest: Nontender, Movement normal; Abdomen: Soft, Nontender, Nondistended, Normal bowel sounds; Genitourinary: No CVA tenderness; Extremities: Peripheral pulses normal, No tenderness, +tr pedal edema bilat. No calf edema or asymmetry.; Neuro: AA&Ox3, Major CN grossly intact. No facial droop. Speech clear. No gross focal motor or sensory deficits in extremities.; Skin: Color normal, Warm, Dry.   ED Treatments / Results  Labs (all labs ordered are listed, but only abnormal results are displayed)   EKG EKG Interpretation  Date/Time:  Wednesday November 29 2017 20:10:38 EDT Ventricular Rate:  87 PR Interval:    QRS Duration: 88 QT Interval:  390 QTC Calculation: 470 R Axis:   59 Text Interpretation:  Sinus rhythm No old tracing to compare Confirmed by Francine Graven [phone]) on 11/29/2017 8:25:55 PM   Radiology   Procedures Procedures (including critical care time)  Medications Ordered in ED Medications - No data to display   Initial Impression / Assessment and Plan / ED Course  I have reviewed the triage vital signs and the nursing notes.  Pertinent labs & imaging results that were available during my care of the patient were reviewed by me and considered in my medical decision making (see chart for details).  MDM Reviewed: previous chart, nursing note and vitals Reviewed previous: labs Interpretation: labs, ECG, x-ray and CT scan Total time providing critical care: 30-74 minutes. This excludes time spent performing  separately reportable procedures and services. Consults: admitting MD   CRITICAL CARE Performed by: Francine Graven Total critical care time: 35 minutes Critical care time was exclusive of separately billable procedures and treating other patients. Critical care was necessary to treat or prevent imminent or life-threatening deterioration. Critical care was time spent personally by me on the following activities: development of treatment plan with patient and/or surrogate as well as nursing, discussions with consultants, evaluation of patient's response to treatment, examination of patient, obtaining history from patient or surrogate, ordering and performing treatments and interventions, ordering and review of laboratory studies, ordering and review of radiographic studies, pulse oximetry and re-evaluation of patient's condition.  Results for orders placed or performed during the hospital encounter of 71/69/67  Basic metabolic panel  Result Value Ref Range   Sodium 141 135 - 145 mmol/L   Potassium 3.3 (L) 3.5 - 5.1 mmol/L   Chloride 108 98 - 111 mmol/L   CO2 24 22 - 32 mmol/L   Glucose, Bld 83 70 - 99 mg/dL   BUN 7 6 - 20 mg/dL   Creatinine, Ser 0.74  0.44 - 1.00 mg/dL   Calcium 8.6 (L) 8.9 - 10.3 mg/dL   GFR calc non Af Amer >60 >60 mL/min   GFR calc Af Amer >60 >60 mL/min   Anion gap 9 5 - 15  Troponin I  Result Value Ref Range   Troponin I <0.03 <0.03 ng/mL  Brain natriuretic peptide  Result Value Ref Range   B Natriuretic Peptide 48.0 0.0 - 100.0 pg/mL  CBC with Differential  Result Value Ref Range   WBC 5.2 4.0 - 10.5 K/uL   RBC 4.72 3.87 - 5.11 MIL/uL   Hemoglobin 13.0 12.0 - 15.0 g/dL   HCT 41.9 36.0 - 46.0 %   MCV 88.8 80.0 - 100.0 fL   MCH 27.5 26.0 - 34.0 pg   MCHC 31.0 30.0 - 36.0 g/dL   RDW 15.1 11.5 - 15.5 %   Platelets 334 150 - 400 K/uL   nRBC 0.0 0.0 - 0.2 %   Neutrophils Relative % 45 %   Neutro Abs 2.4 1.7 - 7.7 K/uL   Lymphocytes Relative 41 %   Lymphs  Abs 2.1 0.7 - 4.0 K/uL   Monocytes Relative 12 %   Monocytes Absolute 0.6 0.1 - 1.0 K/uL   Eosinophils Relative 1 %   Eosinophils Absolute 0.1 0.0 - 0.5 K/uL   Basophils Relative 1 %   Basophils Absolute 0.0 0.0 - 0.1 K/uL   Immature Granulocytes 0 %   Abs Immature Granulocytes 0.01 0.00 - 0.07 K/uL  Urinalysis, Routine w reflex microscopic  Result Value Ref Range   Color, Urine COLORLESS (A) YELLOW   APPearance CLEAR CLEAR   Specific Gravity, Urine 1.002 (L) 1.005 - 1.030   pH 5.0 5.0 - 8.0   Glucose, UA NEGATIVE NEGATIVE mg/dL   Hgb urine dipstick NEGATIVE NEGATIVE   Bilirubin Urine NEGATIVE NEGATIVE   Ketones, ur NEGATIVE NEGATIVE mg/dL   Protein, ur NEGATIVE NEGATIVE mg/dL   Nitrite NEGATIVE NEGATIVE   Leukocytes, UA NEGATIVE NEGATIVE  Pregnancy, urine  Result Value Ref Range   Preg Test, Ur NEGATIVE NEGATIVE   Dg Chest 2 View Result Date: 11/29/2017 CLINICAL DATA:  Hypertension and headaches for 4 days. EXAM: CHEST - 2 VIEW COMPARISON:  None. FINDINGS: The heart size and mediastinal contours are within normal limits. The aorta is slightly uncoiled. Both lungs are clear. The visualized skeletal structures are unremarkable. IMPRESSION: No active cardiopulmonary disease. Electronically Signed   By: Abelardo Diesel M.D.   On: 11/29/2017 21:11   Ct Head Wo Contrast Result Date: 11/29/2017 CLINICAL DATA:  High blood pressure and headaches. EXAM: CT HEAD WITHOUT CONTRAST TECHNIQUE: Contiguous axial images were obtained from the base of the skull through the vertex without intravenous contrast. COMPARISON:  None. FINDINGS: Brain: No evidence of acute infarction, hemorrhage, hydrocephalus, extra-axial collection or mass lesion/mass effect. Vascular: No hyperdense vessel or unexpected calcification. Skull: Normal. Negative for fracture or focal lesion. Sinuses/Orbits: No acute finding. Other: None. IMPRESSION: No focal acute intracranial abnormality identified. Electronically Signed   By:  Abelardo Diesel M.D.   On: 11/29/2017 21:33     2005:  Immediately upon my arrival to exam room, pt's sister stated to me: "before you say or do anything can you talk to her doctor on the phone?" Pt gave me permission to do so. They both explained that pt "saw him today for her breast mammogram but he was a kidney doctor we think." Pt's sister handed me the phone. A man identifying himself as "Dr.  Grause from [employer]" began to state that he had "never seen this level of hypertension before is such a young female," that "this was new for her," and that pt "needed a workup for end organ damage and look for causes such as renal artery stenosis and pheochromocytoma" while in the ED and "then get admitted." I gently explained that pt has been evaluated several times in the ED over the past several years for HTN and medication non-compliance, as well as explained the role of ED and scope of care in the healthcare continuum. He stated he "didn't know this wasn't new" for the pt and then he immediately asked me if I did a full dilated fundoscopic exam and what specifically I saw on her retinas/retinal vessels. I stated to him that the family immediately wanted me to speak with him and that they did not allow me to examine the pt before doing so. He then again reiterated what he had already said above. I thanked him for his time and then handed the phone back to pt's family. Pt's family and I then discussed: They are all aware that this HTN is not new for pt, and that she has been coming to the ED for many years for the same complaint, and remains non-complaint with her medications, continues to smoke. They verb understanding regarding pt's plan of care today and thanked me for my time and in speaking with pt's dr on the phone.   2200:  Workup reassuring. IV labetalol given. Given telephone conversation with physician above, expectations of family and pt were set before my arrival to the exam room (admission).  T/C returned  from [facility]. Maudie Mercury, case discussed, including:  HPI, pertinent PM/SHx, VS/PE, dx testing, ED course and treatment:  Agreeable to admit.    Final Clinical Impressions(s) / ED Diagnoses   Final diagnoses:  None    ED Discharge Orders    None       Francine Graven, DO 12/01/17 2131

## 2017-11-29 NOTE — H&P (Signed)
TRH H&P   Patient Demographics:    Briana Soto, is a 42 y.o. female  MRN: [id number]   DOB - Mar 21, 1975  Admit Date - 11/29/2017  Outpatient Primary MD for the patient is Patient, No Pcp Per   Maryan Puls M.D. ? [facility] [phone] [email or web address]  Referring MD/NP/PA: [facility]  Outpatient Specialists:     Patient coming from: home  Chief Complaint  Patient presents with  . Hypertension      HPI:    Briana Soto  is a 42 y.o. female, w hypertension, apparently has been out of her bp medications.  Pt presented to ED due to her feet being swollen and slight headache. Pt   Pt denies cp, palp, sob, n/v, diarrhea, brbpr, dysuria, hematuria.  Pt apparently seen by Maryan Puls who recommended that she come to ED.   In ED,  T 98.2, P 88 Bp 211/128  Pox 98% on RA  Wbc 5.2, Hgb 13.0, Plt 334 Na 141, K 3.3, Bun 7, Creatinine 0.74 Trop <0.03 Glucose 83  CT brain IMPRESSION: No focal acute intracranial abnormality identified.  CXR IMPRESSION: No active cardiopulmonary disease.   ekg nsr at 85, nl axis, poor R progression No st-t changes c/w ischemia  Pt will be admitted for hypertensive urgency/ hypokalemia.    Review of systems:    In addition to the HPI above,  No Fever-chills, No changes with Vision or hearing, No problems swallowing food or Liquids, No Chest pain, Cough or Shortness of Breath, No Abdominal pain, No Nausea or Vommitting, Bowel movements are regular, No Blood in stool or Urine, No dysuria, No new skin rashes or bruises, No new joints pains-aches,  No new weakness, tingling, numbness in any extremity, No recent weight gain or loss, No polyuria, polydypsia or polyphagia, No significant Mental Stressors.  A full 10 point Review of Systems was done, except as stated above, all other Review of  Systems were negative.   With Past History of the following :    Past Medical History:  Diagnosis Date  . Headache   . Hypertension   . Noncompliance       Past Surgical History:  Procedure Laterality Date  . TUBAL LIGATION        Social History:     Social History   Tobacco Use  . Smoking status: Current Every Day Smoker    Packs/day: 1.00    Types: Cigarettes  . Smokeless tobacco: Never Used  Substance Use Topics  . Alcohol use: Yes    Comment: at least 2 beers daily     Lives - at home  Mobility - walks by self   Family History :     Family History  Problem Relation Age of Onset  . Hypertension Mother        Home Medications:   Prior to Admission medications  Medication Sig Start Date End Date Taking? Authorizing Provider  amLODipine (NORVASC) 5 MG tablet Take 1 tablet (5 mg total) by mouth daily. 01/25/17   Triplett, Tammy, PA-C  clindamycin (CLEOCIN) 150 MG capsule Take 2 capsules (300 mg total) by mouth 3 (three) times daily. For 7 days 01/25/17   Kem Parkinson, PA-C  diphenhydrAMINE (BENADRYL) 25 MG tablet Take 25 mg by mouth 2 (two) times daily as needed.    [provider]  hydrOXYzine (ATARAX/VISTARIL) 25 MG tablet Take 1 tablet (25 mg total) by mouth every 6 (six) hours. Patient not taking: Reported on 01/25/2017 09/07/14   [name], NP  ibuprofen (ADVIL,MOTRIN) 600 MG tablet Take 1 tablet (600 mg total) by mouth every 6 (six) hours as needed. Take with food 01/25/17   Triplett, Tammy, PA-C  permethrin (ELIMITE) 5 % cream Apply to affected area once Patient not taking: Reported on 01/25/2017 09/07/14   [name], NP     Allergies:     Allergies  Allergen Reactions  . Codeine Itching and Nausea And Vomiting     Physical Exam:   Vitals  Blood pressure (!) 173/118, pulse 88, temperature 98.2 F (36.8 C), temperature source Oral, resp. rate 17, height 5\' 6"  (1.676 m), weight 56.7 kg, last menstrual period 11/02/2017, SpO2  98 %.   1. General  lying in bed in NAD,   2. Normal affect and insight, Not Suicidal or Homicidal, Awake Alert, Oriented X 3.  3. No F.N deficits, ALL C.Nerves Intact, Strength 5/5 all 4 extremities, Sensation intact all 4 extremities, Plantars down going.  4. Ears and Eyes appear Normal, Conjunctivae clear, PERRLA. Moist Oral Mucosa.  5. Supple Neck, No JVD, No cervical lymphadenopathy appriciated, No Carotid Bruits.  6. Symmetrical Chest wall movement, Good air movement bilaterally, CTAB.  7. RRR, No Gallops, Rubs or Murmurs, No Parasternal Heave.  8. Positive Bowel Sounds, Abdomen Soft, No tenderness, No organomegaly appriciated,No rebound -guarding or rigidity.  9.  No Cyanosis, Trace edema.   No Skin Rash or Bruise.  10. Good muscle tone,  joints appear normal , no effusions, Normal ROM.  11. No Palpable Lymph Nodes in Neck or Axillae     Data Review:    CBC Recent Labs  Lab 11/29/17 2018  WBC 5.2  HGB 13.0  HCT 41.9  PLT 334  MCV 88.8  MCH 27.5  MCHC 31.0  RDW 15.1  LYMPHSABS 2.1  MONOABS 0.6  EOSABS 0.1  BASOSABS 0.0   ------------------------------------------------------------------------------------------------------------------  Chemistries  Recent Labs  Lab 11/29/17 2018  NA 141  K 3.3*  CL 108  CO2 24  GLUCOSE 83  BUN 7  CREATININE 0.74  CALCIUM 8.6*   ------------------------------------------------------------------------------------------------------------------ estimated creatinine clearance is 81.2 mL/min (by C-G formula based on SCr of 0.74 mg/dL). ------------------------------------------------------------------------------------------------------------------ No results for input(s): TSH, T4TOTAL, T3FREE, THYROIDAB in the last 72 hours.  Invalid input(s): FREET3  Coagulation profile No results for input(s): INR, PROTIME in the last 168  hours. ------------------------------------------------------------------------------------------------------------------- No results for input(s): DDIMER in the last 72 hours. -------------------------------------------------------------------------------------------------------------------  Cardiac Enzymes Recent Labs  Lab 11/29/17 2018  TROPONINI <0.03   ------------------------------------------------------------------------------------------------------------------    Component Value Date/Time   BNP 48.0 11/29/2017 2018     ---------------------------------------------------------------------------------------------------------------  Urinalysis    Component Value Date/Time   COLORURINE COLORLESS (A) 11/29/2017 2018   APPEARANCEUR CLEAR 11/29/2017 2018   LABSPEC 1.002 (L) 11/29/2017 2018   PHURINE 5.0 11/29/2017 2018   [place] NEGATIVE 11/29/2017 2018  [place] NEGATIVE 11/29/2017 2018   [place] NEGATIVE 11/29/2017 2018   [place] NEGATIVE 11/29/2017 2018   PROTEINUR NEGATIVE 11/29/2017 2018   NITRITE NEGATIVE 11/29/2017 2018   LEUKOCYTESUR NEGATIVE 11/29/2017 2018    ----------------------------------------------------------------------------------------------------------------   Imaging Results:    Dg Chest 2 View  Result Date: 11/29/2017 CLINICAL DATA:  Hypertension and headaches for 4 days. EXAM: CHEST - 2 VIEW COMPARISON:  None. FINDINGS: The heart size and mediastinal contours are within normal limits. The aorta is slightly uncoiled. Both lungs are clear. The visualized skeletal structures are unremarkable. IMPRESSION: No active cardiopulmonary disease. Electronically Signed   By: Abelardo Diesel M.D.   On: 11/29/2017 21:11   Ct Head Wo Contrast  Result Date: 11/29/2017 CLINICAL DATA:  High blood pressure and headaches. EXAM: CT HEAD WITHOUT CONTRAST TECHNIQUE: Contiguous axial images were obtained from the base of the skull through the vertex without  intravenous contrast. COMPARISON:  None. FINDINGS: Brain: No evidence of acute infarction, hemorrhage, hydrocephalus, extra-axial collection or mass lesion/mass effect. Vascular: No hyperdense vessel or unexpected calcification. Skull: Normal. Negative for fracture or focal lesion. Sinuses/Orbits: No acute finding. Other: None. IMPRESSION: No focal acute intracranial abnormality identified. Electronically Signed   By: Abelardo Diesel M.D.   On: 11/29/2017 21:33       Assessment & Plan:    Principal Problem:   Hypertensive urgency Active Problems:   Hypokalemia   Tachycardia    Hypertensive urgency Tele Aldosterone, renin Urine 24 hour for metanephrine, catecholamine Plasma metanephrine Renal ultrasound with doppler Labetalol 10mg  iv x1 in ED,  Labetalol 100mg  po bid Hydrochlorothiazide 12.5mg  po qday  Tachycardia Tele TSH  Hypokalemia Replete Check magnesium Check cmp in am  Nicotine dep Nicotine patch prn Pt counselled on smoking cessation x 5 minutes   DVT Prophylaxis  Lovenox - SCDs   AM Labs Ordered, also please review Full Orders  Family Communication: Admission, patients condition and plan of care including tests being ordered have been discussed with the patient who indicate understanding and agree with the plan and Code Status.  Code Status FULL CODE  Likely DC to  home  Condition GUARDED    Consults called: none  Admission status: observation, pt require close monitoring for hypertensive urgency with headache from her bp.  Pt may require iv medications. Pt will be admitted observation for hypertensive urgency and hypokalemia.    Time spent in minutes :  70   Jani Gravel M.D on 11/29/2017 at 10:24 PM  Between 7am to 7pm - Pager - [phone]. After 7pm go to [email or web address] - password [facility]  [facility] - Office  [phone]

## 2017-11-29 NOTE — ED Triage Notes (Signed)
Patient complaining of blood pressure 199/146. Complaining of "just feeling bad for a week, and my feet are swollen." States she is supposed to be taking B/P medication, but has no insurance and is not taking the medicine.

## 2017-11-30 ENCOUNTER — Observation Stay (HOSPITAL_COMMUNITY): Payer: Self-pay

## 2017-11-30 LAB — CBC
HCT: 35.8 % — ABNORMAL LOW (ref 36.0–46.0)
Hemoglobin: 11.1 g/dL — ABNORMAL LOW (ref 12.0–15.0)
MCH: 27.1 pg (ref 26.0–34.0)
MCHC: 31 g/dL (ref 30.0–36.0)
MCV: 87.5 fL (ref 80.0–100.0)
NRBC: 0 % (ref 0.0–0.2)
PLATELETS: 298 10*3/uL (ref 150–400)
RBC: 4.09 MIL/uL (ref 3.87–5.11)
RDW: 15 % (ref 11.5–15.5)
WBC: 4.7 10*3/uL (ref 4.0–10.5)

## 2017-11-30 LAB — COMPREHENSIVE METABOLIC PANEL
ALBUMIN: 3.9 g/dL (ref 3.5–5.0)
ALT: 18 U/L (ref 0–44)
AST: 21 U/L (ref 15–41)
Alkaline Phosphatase: 61 U/L (ref 38–126)
Anion gap: 11 (ref 5–15)
BUN: 5 mg/dL — AB (ref 6–20)
CHLORIDE: 107 mmol/L (ref 98–111)
CO2: 23 mmol/L (ref 22–32)
CREATININE: 0.61 mg/dL (ref 0.44–1.00)
Calcium: 8.6 mg/dL — ABNORMAL LOW (ref 8.9–10.3)
GFR calc Af Amer: >60 mL/min (ref >60)
Glucose, Bld: 79 mg/dL (ref 70–99)
POTASSIUM: 3.1 mmol/L — AB (ref 3.5–5.1)
SODIUM: 141 mmol/L (ref 135–145)
Total Bilirubin: 0.7 mg/dL (ref 0.3–1.2)
Total Protein: 7.3 g/dL (ref 6.5–8.1)

## 2017-11-30 LAB — GLUCOSE, CAPILLARY: Glucose-Capillary: 120 mg/dL — ABNORMAL HIGH (ref 70–99)

## 2017-11-30 LAB — MRSA PCR SCREENING: MRSA by PCR: NEGATIVE

## 2017-11-30 LAB — RAPID URINE DRUG SCREEN, HOSP PERFORMED
Amphetamines: NOT DETECTED
BARBITURATES: NOT DETECTED
BENZODIAZEPINES: NOT DETECTED
COCAINE: NOT DETECTED
OPIATES: NOT DETECTED
Tetrahydrocannabinol: NOT DETECTED

## 2017-11-30 MED ORDER — AMLODIPINE BESYLATE 5 MG PO TABS
5.0000 mg | ORAL_TABLET | Freq: Every day | ORAL | Status: DC
  Administered 2017-11-30: 5 mg via ORAL
  Filled 2017-11-30: qty 1

## 2017-11-30 MED ORDER — LORAZEPAM 0.5 MG PO TABS
0.5000 mg | ORAL_TABLET | Freq: Four times a day (QID) | ORAL | Status: DC | PRN
  Administered 2017-11-30 – 2017-12-01 (×3): 0.5 mg via ORAL
  Filled 2017-11-30 (×3): qty 1

## 2017-11-30 MED ORDER — POTASSIUM CHLORIDE CRYS ER 20 MEQ PO TBCR
40.0000 meq | EXTENDED_RELEASE_TABLET | Freq: Once | ORAL | Status: AC
  Administered 2017-11-30: 40 meq via ORAL
  Filled 2017-11-30: qty 2

## 2017-11-30 MED ORDER — LORAZEPAM 2 MG/ML IJ SOLN
0.5000 mg | Freq: Once | INTRAMUSCULAR | Status: AC
  Administered 2017-11-30: 0.5 mg via INTRAVENOUS
  Filled 2017-11-30: qty 1

## 2017-11-30 NOTE — Progress Notes (Signed)
PROGRESS NOTE    Briana Soto  ZOX:[id number] DOB: [birth year] DOA: 11/29/2017 PCP: Patient, No Pcp Per   Brief Narrative:   This is a 42 year old female with hypertension who has recently been out of her blood pressure medications intermittently over the course of the last year due to insurance issues.  She came to the emergency department with swollen feet and slight headache, but no other symptoms otherwise noted.  She was seen by physician who recommended that she come to the emergency department for further evaluation.  She had been started on labetalol as well as hydrochlorothiazide and IV pushes of labetalol as needed.  Secondary causes of hypertension work-up had been ordered for full evaluation.  Assessment & Plan:   Principal Problem:   Hypertensive urgency Active Problems:   Hypokalemia   Tachycardia   1. Hypertensive crisis.  Continue on current medications with amlodipine, hydrochlorothiazide, and labetalol.  IV hydralazine pushes as needed.  Continue work-up as ordered. 2. Hypokalemia.  Continue to replete orally and check magnesium in a.m. as well as repeat labs. 3. Nicotine dependency.  Continuing patch as ordered.  Counseled on smoking cessation. 4. Anxiety disorder.  Oral Ativan ordered as needed.   DVT prophylaxis: Lovenox Code Status: Full Family Communication: Mother at bedside Disposition Plan: Blood pressure control and evaluation.   Consultants:   None  Procedures:   None  Antimicrobials:   None   Subjective: Patient seen and evaluated today with no new acute complaints or concerns. No acute concerns or events noted overnight.  She is having some mild anxiety and is requesting Ativan.  Objective: Vitals:   11/30/17 0819 11/30/17 1030 11/30/17 1300 11/30/17 1335  BP:  (!) 156/96 (!) 158/107 (!) 148/90  Pulse:   84   Resp: (!) 22     Temp:      TempSrc:      SpO2:      Weight:      Height:        Intake/Output Summary (Last 24  hours) at 11/30/2017 1336 Last data filed at 11/30/2017 0820 Gross per 24 hour  Intake 50 ml  Output -  Net 50 ml   Filed Weights   11/29/17 1849 11/29/17 2300 11/30/17 0500  Weight: 56.7 kg 53.6 kg 53.6 kg    Examination:  General exam: Appears calm and comfortable  Respiratory system: Clear to auscultation. Respiratory effort normal. Cardiovascular system: S1 & S2 heard, RRR. No JVD, murmurs, rubs, gallops or clicks. No pedal edema. Gastrointestinal system: Abdomen is nondistended, soft and nontender. No organomegaly or masses felt. Normal bowel sounds heard. Central nervous system: Alert and oriented. No focal neurological deficits. Extremities: Symmetric 5 x 5 power. Skin: No rashes, lesions or ulcers Psychiatry: Judgement and insight appear normal. Mood & affect appropriate.     Data Reviewed: I have personally reviewed following labs and imaging studies  CBC: Recent Labs  Lab 11/29/17 2018 11/30/17 0408  WBC 5.2 4.7  NEUTROABS 2.4  --   HGB 13.0 11.1*  HCT 41.9 35.8*  MCV 88.8 87.5  PLT 334 811   Basic Metabolic Panel: Recent Labs  Lab 11/29/17 2018 11/29/17 2225 11/30/17 0408  NA 141  --  141  K 3.3*  --  3.1*  CL 108  --  107  CO2 24  --  23  GLUCOSE 83  --  79  BUN 7  --  5*  CREATININE 0.74  --  0.61  CALCIUM 8.6*  --  8.6*  MG  --  2.1  --    GFR: Estimated Creatinine Clearance: 76.7 mL/min (by C-G formula based on SCr of 0.61 mg/dL). Liver Function Tests: Recent Labs  Lab 11/30/17 0408  AST 21  ALT 18  ALKPHOS 61  BILITOT 0.7  PROT 7.3  ALBUMIN 3.9   No results for input(s): LIPASE, AMYLASE in the last 168 hours. No results for input(s): AMMONIA in the last 168 hours. Coagulation Profile: No results for input(s): INR, PROTIME in the last 168 hours. Cardiac Enzymes: Recent Labs  Lab 11/29/17 2018  TROPONINI <0.03   BNP (last 3 results) No results for input(s): PROBNP in the last 8760 hours. HbA1C: No results for input(s):  HGBA1C in the last 72 hours. CBG: No results for input(s): GLUCAP in the last 168 hours. Lipid Profile: No results for input(s): CHOL, HDL, LDLCALC, TRIG, CHOLHDL, LDLDIRECT in the last 72 hours. Thyroid Function Tests: Recent Labs    11/29/17 2225  TSH 0.565   Anemia Panel: No results for input(s): VITAMINB12, FOLATE, FERRITIN, TIBC, IRON, RETICCTPCT in the last 72 hours. Sepsis Labs: No results for input(s): PROCALCITON, LATICACIDVEN in the last 168 hours.  No results found for this or any previous visit (from the past 240 hour(s)).       Radiology Studies: Dg Chest 2 View  Result Date: 11/29/2017 CLINICAL DATA:  Hypertension and headaches for 4 days. EXAM: CHEST - 2 VIEW COMPARISON:  None. FINDINGS: The heart size and mediastinal contours are within normal limits. The aorta is slightly uncoiled. Both lungs are clear. The visualized skeletal structures are unremarkable. IMPRESSION: No active cardiopulmonary disease. Electronically Signed   By: Abelardo Diesel M.D.   On: 11/29/2017 21:11   Ct Head Wo Contrast  Result Date: 11/29/2017 CLINICAL DATA:  High blood pressure and headaches. EXAM: CT HEAD WITHOUT CONTRAST TECHNIQUE: Contiguous axial images were obtained from the base of the skull through the vertex without intravenous contrast. COMPARISON:  None. FINDINGS: Brain: No evidence of acute infarction, hemorrhage, hydrocephalus, extra-axial collection or mass lesion/mass effect. Vascular: No hyperdense vessel or unexpected calcification. Skull: Normal. Negative for fracture or focal lesion. Sinuses/Orbits: No acute finding. Other: None. IMPRESSION: No focal acute intracranial abnormality identified. Electronically Signed   By: Abelardo Diesel M.D.   On: 11/29/2017 21:33        Scheduled Meds: . amLODipine  5 mg Oral Daily  . enoxaparin (LOVENOX) injection  40 mg Subcutaneous Q24H  . hydrochlorothiazide  12.5 mg Oral Daily  . labetalol  100 mg Oral BID  . sodium chloride flush   3 mL Intravenous Q12H   Continuous Infusions: . sodium chloride       LOS: 0 days    Time spent: 30 minutes    Elisha Cooksey Darleen Crocker, DO [facility] Pager [phone]  If 7PM-7AM, please contact night-coverage [email or web address] Password [email or web address] 11/30/2017, 1:36 PM

## 2017-11-30 NOTE — Progress Notes (Signed)
Patient stated she felt shakey  . Checked a blood sugar reading was 120. Will continue to monitor throughout shift.

## 2017-12-01 LAB — CBC
HEMATOCRIT: 37.7 % (ref 36.0–46.0)
Hemoglobin: 11.7 g/dL — ABNORMAL LOW (ref 12.0–15.0)
MCH: 27.1 pg (ref 26.0–34.0)
MCHC: 31 g/dL (ref 30.0–36.0)
MCV: 87.5 fL (ref 80.0–100.0)
PLATELETS: 315 10*3/uL (ref 150–400)
RBC: 4.31 MIL/uL (ref 3.87–5.11)
RDW: 15.4 % (ref 11.5–15.5)
WBC: 5 10*3/uL (ref 4.0–10.5)
nRBC: 0 % (ref 0.0–0.2)

## 2017-12-01 LAB — BASIC METABOLIC PANEL
Anion gap: 10 (ref 5–15)
BUN: 7 mg/dL (ref 6–20)
CO2: 26 mmol/L (ref 22–32)
Calcium: 9.2 mg/dL (ref 8.9–10.3)
Chloride: 99 mmol/L (ref 98–111)
Creatinine, Ser: 0.65 mg/dL (ref 0.44–1.00)
GFR calc Af Amer: >60 mL/min (ref >60)
GLUCOSE: 97 mg/dL (ref 70–99)
POTASSIUM: 3.1 mmol/L — AB (ref 3.5–5.1)
Sodium: 135 mmol/L (ref 135–145)

## 2017-12-01 LAB — HIV ANTIBODY (ROUTINE TESTING W REFLEX): HIV SCREEN 4TH GENERATION: NONREACTIVE

## 2017-12-01 LAB — MAGNESIUM: Magnesium: 1.8 mg/dL (ref 1.7–2.4)

## 2017-12-01 MED ORDER — AMLODIPINE BESYLATE 10 MG PO TABS: 10.0000 mg | ORAL_TABLET | Freq: Every day | ORAL | 3 refills | Status: DC

## 2017-12-01 MED ORDER — LORAZEPAM 0.5 MG PO TABS: 0.5000 mg | ORAL_TABLET | Freq: Four times a day (QID) | ORAL | 0 refills | Status: DC | PRN

## 2017-12-01 MED ORDER — HYDROCHLOROTHIAZIDE 25 MG PO TABS
25.0000 mg | ORAL_TABLET | Freq: Every day | ORAL | Status: DC
  Administered 2017-12-01: 25 mg via ORAL
  Filled 2017-12-01: qty 1

## 2017-12-01 MED ORDER — AMLODIPINE BESYLATE 5 MG PO TABS
10.0000 mg | ORAL_TABLET | Freq: Every day | ORAL | Status: DC
  Administered 2017-12-01: 10 mg via ORAL
  Filled 2017-12-01: qty 2

## 2017-12-01 MED ORDER — LABETALOL HCL 100 MG PO TABS: 100.0000 mg | ORAL_TABLET | Freq: Two times a day (BID) | ORAL | 3 refills | Status: DC

## 2017-12-01 MED ORDER — HYDROCHLOROTHIAZIDE 25 MG PO TABS: 25.0000 mg | ORAL_TABLET | Freq: Every day | ORAL | 3 refills | Status: DC

## 2017-12-01 MED ORDER — POTASSIUM CHLORIDE CRYS ER 20 MEQ PO TBCR
40.0000 meq | EXTENDED_RELEASE_TABLET | Freq: Once | ORAL | Status: AC
  Administered 2017-12-01: 40 meq via ORAL
  Filled 2017-12-01: qty 2

## 2017-12-01 NOTE — Care Management Note (Signed)
Case Management Note  Patient Details  Name: Briana Soto MRN: [id number] Date of Birth: [birth year]  Subjective/Objective:  Hypertensive urgency. From home, works full time. Reports she will have insurance in 90 days. Seen by Financial counselor and assessed for Medicaid eligibility. No PCP.                   Action/Plan: Made appt with [facility] on Monday to help with PCP establishment and resources. Patient was given MATCH to help assist with Medications. Gave Good RX card for future use on prescriptions. Patient given [facility] flyer with details. Discussed [facility] $4 dollar medication list and gave copy of the list.  Patient reports understanding of all discussed. DC home today.    Expected Discharge Date:  12/01/17               Expected Discharge Plan:  Home/Self Care  In-House Referral:     Discharge planning Services  CM Consult, Follow-up appt scheduled, [facility], [place], Medication Assistance  Post Acute Care Choice:    Choice offered to:     DME Arranged:    DME Agency:     HH Arranged:    HH Agency:     Status of Service:  Completed, signed off  If discussed at [employer], dates discussed:    Additional Comments:  Yashar Inclan, Chauncey Reading, RN 12/01/2017, 2:21 PM

## 2017-12-01 NOTE — Discharge Summary (Signed)
Physician Discharge Summary  MICHAL STRZELECKI MLY:[id number] DOB: Feb 03, 1976 DOA: 11/29/2017  PCP: Patient, No Pcp Per  Admit date: 11/29/2017  Discharge date: 12/01/2017  Admitted From:Home  Disposition:  Home  Recommendations for Outpatient Follow-up:  1. Follow up with [facility] on 10/14 to establish PCP 2. Resources given for patient to obtain her medications on discharge  Home Health: None  Equipment/Devices: None  Discharge Condition: Stable  CODE STATUS: Full  Diet recommendation: Heart Healthy  Brief/Interim Summary:  This is a 42 year old female with hypertension who has recently been out of her blood pressure medications intermittently over the course of the last year due to insurance issues.  She came to the emergency department with swollen feet and slight headache, but no other symptoms otherwise noted.  She was seen by physician who recommended that she come to the emergency department for further evaluation.  She had been started on labetalol as well as hydrochlorothiazide and IV pushes of labetalol as needed.  Secondary causes of hypertension work-up had been ordered for full evaluation, but it is doubtful that this is a cause as she has responded well to current oral agents.  She has had no other acute events during the course of this admission and will be discharged on amlodipine 10 mg daily, hydrochlorothiazide 25 mg daily, labetalol 100 mg twice a day, and Ativan 0.5 mg as needed every 8 hours for tremors and anxiety.  Discharge Diagnoses:  Principal Problem:   Hypertensive urgency Active Problems:   Hypokalemia   Tachycardia    Discharge Instructions  Discharge Instructions    Diet - low sodium heart healthy   Complete by:  As directed    Increase activity slowly   Complete by:  As directed      Allergies as of 12/01/2017      Reactions   Codeine Itching, Nausea And Vomiting      Medication List    TAKE these medications   amLODipine 10 MG  tablet Commonly known as:  NORVASC Take 1 tablet (10 mg total) by mouth daily. Start taking on:  12/02/2017   hydrochlorothiazide 25 MG tablet Commonly known as:  HYDRODIURIL Take 1 tablet (25 mg total) by mouth daily. Start taking on:  12/02/2017   labetalol 100 MG tablet Commonly known as:  NORMODYNE Take 1 tablet (100 mg total) by mouth 2 (two) times daily.   LORazepam 0.5 MG tablet Commonly known as:  ATIVAN Take 1 tablet (0.5 mg total) by mouth every 6 (six) hours as needed for anxiety.      Follow-up Information    [facility] Follow up.   Why:  appt Monday 14th, 2019 at 11:00 am to help with establish primary care and resources  Please call and reschedule if needed- Please call if you have ANY questions about what to bring to appointment         Allergies  Allergen Reactions  . Codeine Itching and Nausea And Vomiting    Consultations:  None   Procedures/Studies: Dg Chest 2 View  Result Date: 11/29/2017 CLINICAL DATA:  Hypertension and headaches for 4 days. EXAM: CHEST - 2 VIEW COMPARISON:  None. FINDINGS: The heart size and mediastinal contours are within normal limits. The aorta is slightly uncoiled. Both lungs are clear. The visualized skeletal structures are unremarkable. IMPRESSION: No active cardiopulmonary disease. Electronically Signed   By: Abelardo Diesel M.D.   On: 11/29/2017 21:11   Ct Head Wo Contrast  Result Date: 11/29/2017 CLINICAL DATA:  High blood pressure and headaches. EXAM: CT HEAD WITHOUT CONTRAST TECHNIQUE: Contiguous axial images were obtained from the base of the skull through the vertex without intravenous contrast. COMPARISON:  None. FINDINGS: Brain: No evidence of acute infarction, hemorrhage, hydrocephalus, extra-axial collection or mass lesion/mass effect. Vascular: No hyperdense vessel or unexpected calcification. Skull: Normal. Negative for fracture or focal lesion. Sinuses/Orbits: No acute finding. Other: None. IMPRESSION: No focal  acute intracranial abnormality identified. Electronically Signed   By: Abelardo Diesel M.D.   On: 11/29/2017 21:33     Discharge Exam: Vitals:   12/01/17 0929 12/01/17 1016  BP: (!) 124/94 (!) 136/92  Pulse: 92   Resp: 16   Temp:    SpO2: 100%    Vitals:   12/01/17 0800 12/01/17 0815 12/01/17 0929 12/01/17 1016  BP: (!) 142/108  (!) 124/94 (!) 136/92  Pulse: (!) 112 (!) 103 92   Resp: 14 16 16    Temp:  98.2 F (36.8 C)    TempSrc:  Oral    SpO2: 100% 100% 100%   Weight:      Height:        General: Pt is alert, awake, not in acute distress Cardiovascular: RRR, S1/S2 +, no rubs, no gallops Respiratory: CTA bilaterally, no wheezing, no rhonchi Abdominal: Soft, NT, ND, bowel sounds + Extremities: no edema, no cyanosis    The results of significant diagnostics from this hospitalization (including imaging, microbiology, ancillary and laboratory) are listed below for reference.     Microbiology: Recent Results (from the past 240 hour(s))  MRSA PCR Screening     Status: None   Collection Time: 11/30/17  8:21 PM  Result Value Ref Range Status   MRSA by PCR NEGATIVE NEGATIVE Final    Comment:        The GeneXpert MRSA Assay (FDA approved for NASAL specimens only), is one component of a comprehensive MRSA colonization surveillance program. It is not intended to diagnose MRSA infection nor to guide or monitor treatment for MRSA infections. Performed at [facility], [address], [place], [place] [address]      Labs: BNP (last 3 results) Recent Labs    11/29/17 2018  BNP 50.5   Basic Metabolic Panel: Recent Labs  Lab 11/29/17 2018 11/29/17 2225 11/30/17 0408 12/01/17 0357  NA 141  --  141 135  K 3.3*  --  3.1* 3.1*  CL 108  --  107 99  CO2 24  --  23 26  GLUCOSE 83  --  79 97  BUN 7  --  5* 7  CREATININE 0.74  --  0.61 0.65  CALCIUM 8.6*  --  8.6* 9.2  MG  --  2.1  --  1.8   Liver Function Tests: Recent Labs  Lab 11/30/17 0408  AST 21  ALT 18   ALKPHOS 61  BILITOT 0.7  PROT 7.3  ALBUMIN 3.9   No results for input(s): LIPASE, AMYLASE in the last 168 hours. No results for input(s): AMMONIA in the last 168 hours. CBC: Recent Labs  Lab 11/29/17 2018 11/30/17 0408 12/01/17 0357  WBC 5.2 4.7 5.0  NEUTROABS 2.4  --   --   HGB 13.0 11.1* 11.7*  HCT 41.9 35.8* 37.7  MCV 88.8 87.5 87.5  PLT 334 298 315   Cardiac Enzymes: Recent Labs  Lab 11/29/17 2018  TROPONINI <0.03   BNP: Invalid input(s): POCBNP CBG: Recent Labs  Lab 11/30/17 1945  GLUCAP 120*   D-Dimer No results  for input(s): DDIMER in the last 72 hours. Hgb A1c No results for input(s): HGBA1C in the last 72 hours. Lipid Profile No results for input(s): CHOL, HDL, LDLCALC, TRIG, CHOLHDL, LDLDIRECT in the last 72 hours. Thyroid function studies Recent Labs    11/29/17 2225  TSH 0.565   Anemia work up No results for input(s): VITAMINB12, FOLATE, FERRITIN, TIBC, IRON, RETICCTPCT in the last 72 hours. Urinalysis    Component Value Date/Time   COLORURINE COLORLESS (A) 11/29/2017 2018   APPEARANCEUR CLEAR 11/29/2017 2018   LABSPEC 1.002 (L) 11/29/2017 2018   PHURINE 5.0 11/29/2017 2018   GLUCOSEU NEGATIVE 11/29/2017 2018   HGBUR NEGATIVE 11/29/2017 2018   [place] NEGATIVE 11/29/2017 2018   KETONESUR NEGATIVE 11/29/2017 2018   PROTEINUR NEGATIVE 11/29/2017 2018   NITRITE NEGATIVE 11/29/2017 2018   LEUKOCYTESUR NEGATIVE 11/29/2017 2018   Sepsis Labs Invalid input(s): PROCALCITONIN,  WBC,  LACTICIDVEN Microbiology Recent Results (from the past 240 hour(s))  MRSA PCR Screening     Status: None   Collection Time: 11/30/17  8:21 PM  Result Value Ref Range Status   MRSA by PCR NEGATIVE NEGATIVE Final    Comment:        The GeneXpert MRSA Assay (FDA approved for NASAL specimens only), is one component of a comprehensive MRSA colonization surveillance program. It is not intended to diagnose MRSA infection nor to guide or monitor treatment  for MRSA infections. Performed at [facility], [address] [place], [place] [address]      Time coordinating discharge: 35 minutes  SIGNED:   Rodena Goldmann, DO [facility] 12/01/2017, 10:39 AM Pager [phone]  If 7PM-7AM, please contact night-coverage [email or web address] Password [email or web address]

## 2017-12-01 NOTE — Progress Notes (Signed)
Pt given discharge information with understanding. Pt has no questions at this time. Iv and monitor d/c.

## 2017-12-05 LAB — METANEPHRINES, PLASMA
Metanephrine, Free: 28 pg/mL (ref 0–62)
Normetanephrine, Free: 81 pg/mL (ref 0–145)

## 2017-12-09 LAB — ALDOSTERONE + RENIN ACTIVITY W/ RATIO
ALDO / PRA Ratio: 1.8 (ref 0.0–30.0)
ALDOSTERONE: 4.3 ng/dL (ref 0.0–30.0)
PRA LC/MS/MS: 2.44 ng/mL/hr (ref 0.167–5.380)

## 2019-09-12 IMAGING — DX DG CHEST 2V
2 series · 2 of 2 positions shown · non-contrast
Comparison: None.

CLINICAL DATA: Hypertension and headaches for 4 days.

EXAM:
CHEST - 2 VIEW

[chest lat]
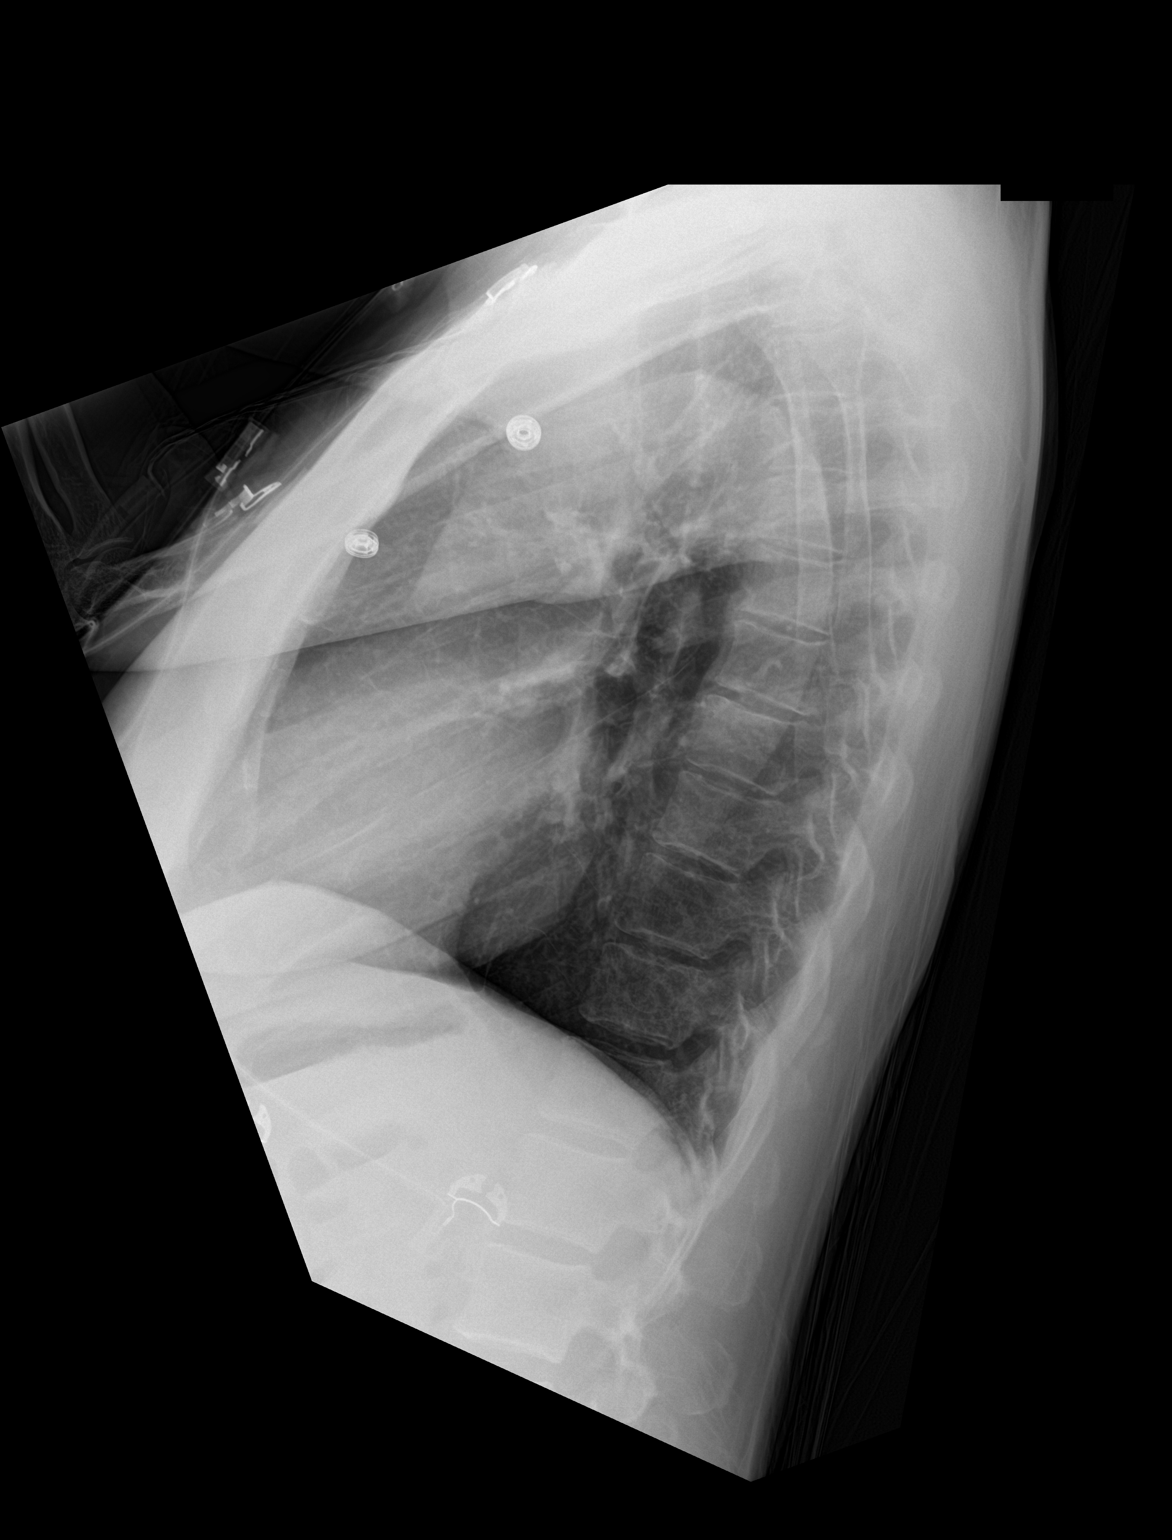

[chest ap]
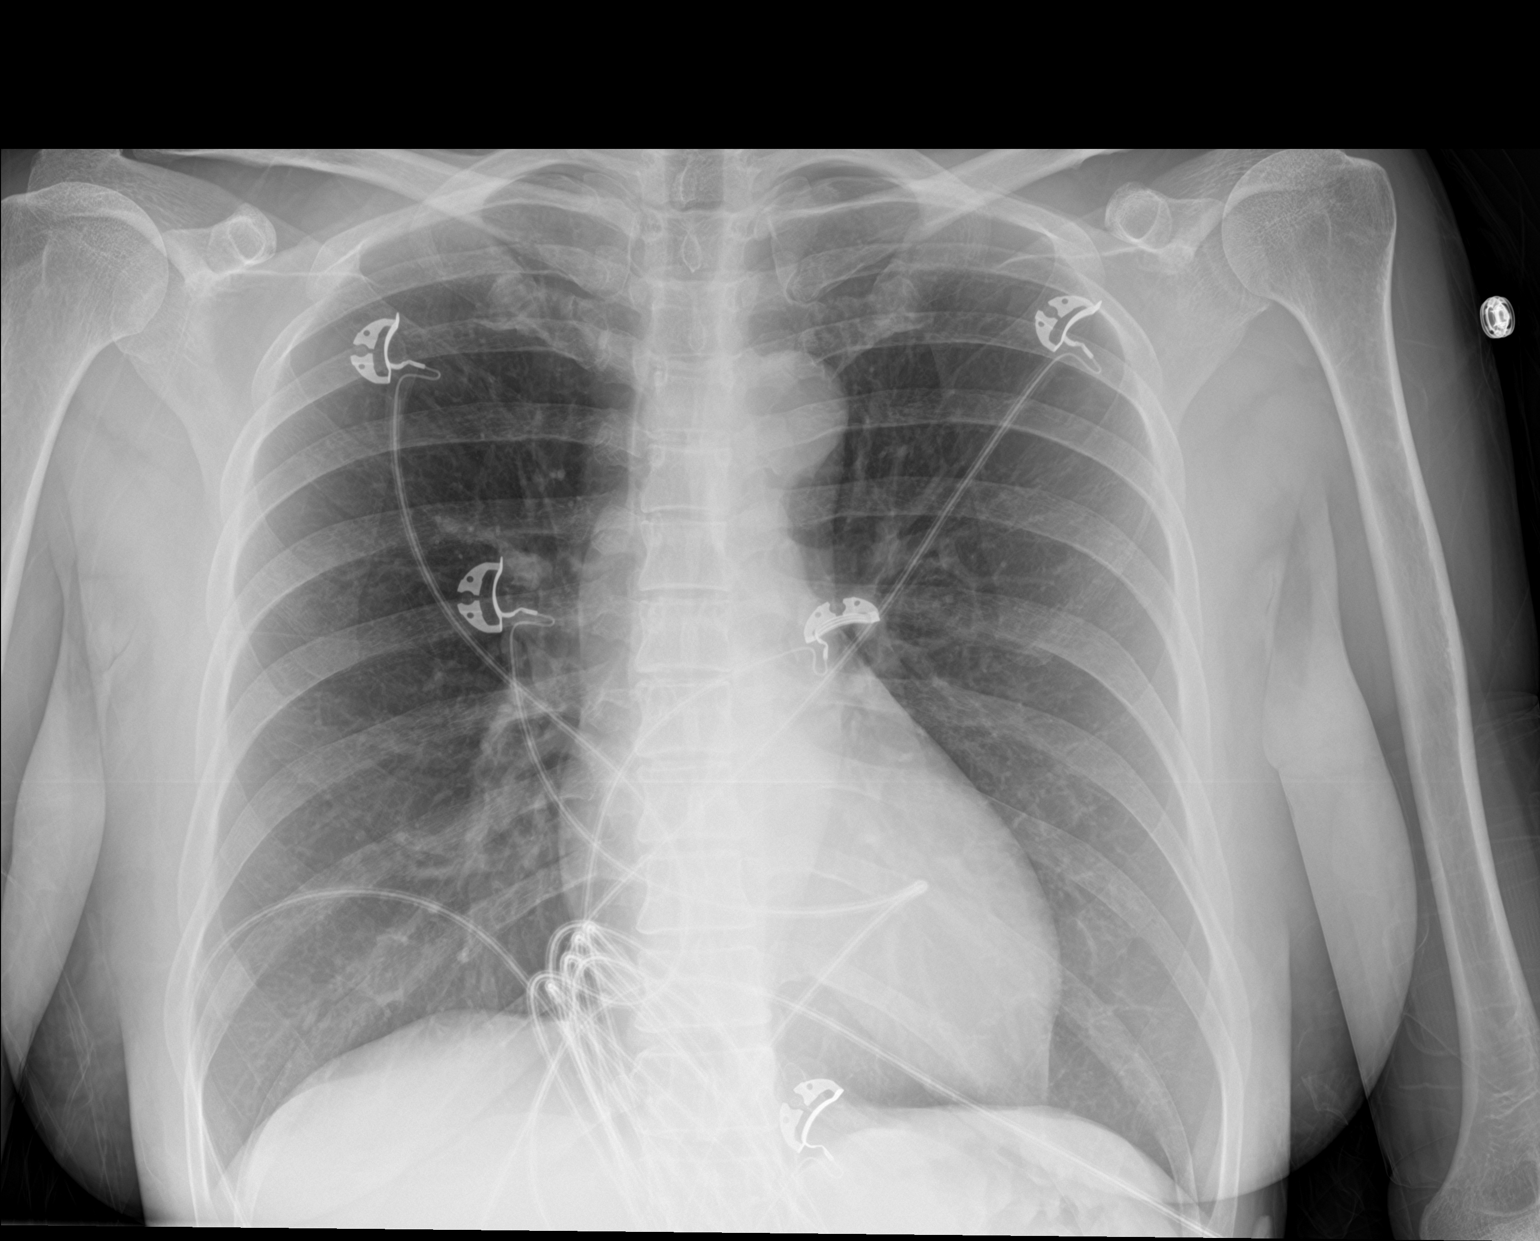

[2 of 2 positions shown; findings below may reference images not displayed]

FINDINGS: The heart size and mediastinal contours are within normal limits.
The aorta is slightly uncoiled. Both lungs are clear. The visualized
skeletal structures are unremarkable.
IMPRESSION: No active cardiopulmonary disease.

## 2020-05-22 ENCOUNTER — Emergency Department (HOSPITAL_COMMUNITY): Payer: Self-pay

## 2020-05-22 ENCOUNTER — Emergency Department (HOSPITAL_COMMUNITY)
Admission: EM | Admit: 2020-05-22 | Discharge: 2020-05-22 | Disposition: A | Discharge status: Home / Self Care | Payer: Self-pay | Attending: Emergency Medicine | Admitting: Emergency Medicine

## 2020-05-22 ENCOUNTER — Other Ambulatory Visit: Payer: Self-pay

## 2020-05-22 DIAGNOSIS — I10 Essential (primary) hypertension: Secondary | ICD-10-CM | POA: Insufficient documentation

## 2020-05-22 DIAGNOSIS — F17210 Nicotine dependence, cigarettes, uncomplicated: Secondary | ICD-10-CM | POA: Insufficient documentation

## 2020-05-22 DIAGNOSIS — N63 Unspecified lump in unspecified breast: Secondary | ICD-10-CM | POA: Insufficient documentation

## 2020-05-22 DIAGNOSIS — Z79899 Other long term (current) drug therapy: Secondary | ICD-10-CM | POA: Insufficient documentation

## 2020-05-22 LAB — CBC WITH DIFFERENTIAL/PLATELET
Abs Immature Granulocytes: 0.01 10*3/uL (ref 0.00–0.07)
Basophils Absolute: 0 10*3/uL (ref 0.0–0.1)
Basophils Relative: 1 %
Eosinophils Absolute: 0.1 10*3/uL (ref 0.0–0.5)
Eosinophils Relative: 1 %
HCT: 40.1 % (ref 36.0–46.0)
Hemoglobin: 12.4 g/dL (ref 12.0–15.0)
Immature Granulocytes: 0 %
Lymphocytes Relative: 48 %
Lymphs Abs: 2.6 10*3/uL (ref 0.7–4.0)
MCH: 25.7 pg — ABNORMAL LOW (ref 26.0–34.0)
MCHC: 30.9 g/dL (ref 30.0–36.0)
MCV: 83.2 fL (ref 80.0–100.0)
Monocytes Absolute: 0.6 10*3/uL (ref 0.1–1.0)
Monocytes Relative: 10 %
Neutro Abs: 2.2 10*3/uL (ref 1.7–7.7)
Neutrophils Relative %: 40 %
Platelets: 250 10*3/uL (ref 150–400)
RBC: 4.82 MIL/uL (ref 3.87–5.11)
RDW: 20.3 % — ABNORMAL HIGH (ref 11.5–15.5)
WBC: 5.5 10*3/uL (ref 4.0–10.5)
nRBC: 0 % (ref 0.0–0.2)

## 2020-05-22 LAB — COMPREHENSIVE METABOLIC PANEL
ALT: 95 U/L — ABNORMAL HIGH (ref 0–44)
AST: 96 U/L — ABNORMAL HIGH (ref 15–41)
Albumin: 4.2 g/dL (ref 3.5–5.0)
Alkaline Phosphatase: 72 U/L (ref 38–126)
Anion gap: 12 (ref 5–15)
BUN: 5 mg/dL — ABNORMAL LOW (ref 6–20)
CO2: 24 mmol/L (ref 22–32)
Calcium: 9.3 mg/dL (ref 8.9–10.3)
Chloride: 98 mmol/L (ref 98–111)
Creatinine, Ser: 0.65 mg/dL (ref 0.44–1.00)
GFR, Estimated: >60 mL/min (ref >60)
Glucose, Bld: 110 mg/dL — ABNORMAL HIGH (ref 70–99)
Potassium: 3.3 mmol/L — ABNORMAL LOW (ref 3.5–5.1)
Sodium: 134 mmol/L — ABNORMAL LOW (ref 135–145)
Total Bilirubin: 0.4 mg/dL (ref 0.3–1.2)
Total Protein: 8.6 g/dL — ABNORMAL HIGH (ref 6.5–8.1)

## 2020-05-22 LAB — CBG MONITORING, ED: Glucose-Capillary: 107 mg/dL — ABNORMAL HIGH (ref 70–99)

## 2020-05-22 LAB — TROPONIN I (HIGH SENSITIVITY)
Troponin I (High Sensitivity): 5 ng/L (ref <18)
Troponin I (High Sensitivity): 4 ng/L (ref <18)

## 2020-05-22 MED ORDER — LABETALOL HCL 100 MG PO TABS: 100.0000 mg | ORAL_TABLET | Freq: Two times a day (BID) | ORAL | 1 refills | Status: DC

## 2020-05-22 MED ORDER — IOHEXOL 300 MG/ML  SOLN
75.0000 mL | Freq: Once | INTRAMUSCULAR | Status: AC | PRN
  Administered 2020-05-22: 75 mL via INTRAVENOUS

## 2020-05-22 MED ORDER — HYDRALAZINE HCL 20 MG/ML IJ SOLN
5.0000 mg | Freq: Once | INTRAMUSCULAR | Status: AC
  Administered 2020-05-22: 5 mg via INTRAVENOUS
  Filled 2020-05-22: qty 1

## 2020-05-22 NOTE — ED Provider Notes (Signed)
[facility] EMERGENCY DEPARTMENT Provider Note   CSN: [id number] Arrival date & time: 05/22/20  1849     History Chief Complaint  Patient presents with  . Chest Pain    Briana Soto is a 45 y.o. female.  Patient complains of a headache.  She has history of high blood pressure but does not take her medicines.  She stated that earlier today that her upper lip was tight and moving  The history is provided by the patient. No language interpreter was used.  Headache Pain location:  Frontal Quality:  Dull Radiates to:  Does not radiate Severity currently:  2/10 Severity at highest:  3/10 Onset quality:  Sudden Timing:  Intermittent Progression:  Waxing and waning Chronicity:  New Context: not activity   Associated symptoms: no abdominal pain, no back pain, no congestion, no cough, no diarrhea, no fatigue, no seizures and no sinus pressure        Past Medical History:  Diagnosis Date  . Headache   . Hypertension   . Noncompliance     Patient Active Problem List   Diagnosis Date Noted  . Hypertensive urgency 11/29/2017  . Hypokalemia 11/29/2017  . Tachycardia 11/29/2017    Past Surgical History:  Procedure Laterality Date  . TUBAL LIGATION       OB History   No obstetric history on file.     Family History  Problem Relation Age of Onset  . Hypertension Mother     Social History   Tobacco Use  . Smoking status: Current Every Day Smoker    Packs/day: 1.00    Types: Cigarettes  . Smokeless tobacco: Never Used  Substance Use Topics  . Alcohol use: Yes    Comment: at least 2 beers daily  . Drug use: No    Home Medications Prior to Admission medications   Medication Sig Start Date End Date Taking? Authorizing Provider  labetalol (NORMODYNE) 100 MG tablet Take 1 tablet (100 mg total) by mouth 2 (two) times daily. 05/22/20  Yes Milton Ferguson, MD  amLODipine (NORVASC) 10 MG tablet Take 1 tablet (10 mg total) by mouth daily. 12/02/17 01/01/18  Manuella Ghazi,  Pratik D, DO  hydrochlorothiazide (HYDRODIURIL) 25 MG tablet Take 1 tablet (25 mg total) by mouth daily. 12/02/17 04/01/18  Manuella Ghazi, Pratik D, DO  LORazepam (ATIVAN) 0.5 MG tablet Take 1 tablet (0.5 mg total) by mouth every 6 (six) hours as needed for anxiety. 12/01/17   Manuella Ghazi, Pratik D, DO    Allergies    Codeine  Review of Systems   Review of Systems  Constitutional: Negative for appetite change and fatigue.  HENT: Negative for congestion, ear discharge and sinus pressure.   Eyes: Negative for discharge.  Respiratory: Negative for cough.   Cardiovascular: Negative for chest pain.  Gastrointestinal: Negative for abdominal pain and diarrhea.  Genitourinary: Negative for frequency and hematuria.  Musculoskeletal: Negative for back pain.  Skin: Negative for rash.       Swelling to breast  Neurological: Positive for headaches. Negative for seizures.  Psychiatric/Behavioral: Negative for hallucinations.    Physical Exam Updated Vital Signs BP (!) 150/106   Pulse 84   Temp 98.6 F (37 C)   Resp 18   Ht 5\' 5"  (1.651 m)   Wt 54.4 kg   LMP 03/24/2020 (Approximate)   SpO2 100%   BMI 19.97 kg/m   Physical Exam Vitals and nursing note reviewed.  Constitutional:      Appearance: She is well-developed.  HENT:     Head: Normocephalic.     Nose: Nose normal.  Eyes:     General: No scleral icterus.    Conjunctiva/sclera: Conjunctivae normal.  Neck:     Thyroid: No thyromegaly.  Cardiovascular:     Rate and Rhythm: Normal rate and regular rhythm.     Heart sounds: No murmur heard. No friction rub. No gallop.   Pulmonary:     Breath sounds: No stridor. No wheezing or rales.     Comments: Patient has a large mass to her left breast that is nontender Chest:     Chest wall: No tenderness.  Abdominal:     General: There is no distension.     Tenderness: There is no abdominal tenderness. There is no rebound.  Musculoskeletal:        General: Normal range of motion.     Cervical  back: Neck supple.  Lymphadenopathy:     Cervical: No cervical adenopathy.  Skin:    Findings: No erythema or rash.  Neurological:     Mental Status: She is alert and oriented to person, place, and time.     Motor: No abnormal muscle tone.     Coordination: Coordination normal.  Psychiatric:        Behavior: Behavior normal.     ED Results / Procedures / Treatments   Labs (all labs ordered are listed, but only abnormal results are displayed) Labs Reviewed  CBC WITH DIFFERENTIAL/PLATELET - Abnormal; Notable for the following components:      Result Value   MCH 25.7 (*)    RDW 20.3 (*)    All other components within normal limits  COMPREHENSIVE METABOLIC PANEL - Abnormal; Notable for the following components:   Sodium 134 (*)    Potassium 3.3 (*)    Glucose, Bld 110 (*)    BUN 5 (*)    Total Protein 8.6 (*)    AST 96 (*)    ALT 95 (*)    All other components within normal limits  CBG MONITORING, ED - Abnormal; Notable for the following components:   Glucose-Capillary 107 (*)    All other components within normal limits  TROPONIN I (HIGH SENSITIVITY)  TROPONIN I (HIGH SENSITIVITY)    EKG None  Radiology DG Chest 2 View  Result Date: 05/22/2020 CLINICAL DATA:  Chest pain EXAM: CHEST - 2 VIEW COMPARISON:  11/29/2017 FINDINGS: The heart size and mediastinal contours are within normal limits. Both lungs are clear. The visualized skeletal structures are unremarkable. IMPRESSION: No active cardiopulmonary disease. Electronically Signed   By: Ulyses Jarred M.D.   On: 05/22/2020 19:23   CT Head Wo Contrast  Result Date: 05/22/2020 CLINICAL DATA:  FACIAL NUMBNESS EXAM: CT HEAD WITHOUT CONTRAST TECHNIQUE: Contiguous axial images were obtained from the base of the skull through the vertex without intravenous contrast. COMPARISON:  Head CT 11/29/2017 FINDINGS: Brain: There is no mass, hemorrhage or extra-axial collection. The size and configuration of the ventricles and extra-axial  CSF spaces are normal. The brain parenchyma is normal, without acute or chronic infarction. Vascular: No abnormal hyperdensity of the major intracranial arteries or dural venous sinuses. No intracranial atherosclerosis. Skull: The visualized skull base, calvarium and extracranial soft tissues are normal. Sinuses/Orbits: No fluid levels or advanced mucosal thickening of the visualized paranasal sinuses. No mastoid or middle ear effusion. The orbits are normal. IMPRESSION: Normal head CT Electronically Signed   By: Ulyses Jarred M.D.   On: 05/22/2020 19:32  CT Chest W Contrast  Result Date: 05/22/2020 CLINICAL DATA:  45 year old female with abnormal x-ray. Pulmonary nodule. EXAM: CT CHEST WITH CONTRAST TECHNIQUE: Multidetector CT imaging of the chest was performed during intravenous contrast administration. CONTRAST:  55mL OMNIPAQUE IOHEXOL 300 MG/ML  SOLN COMPARISON:  Chest radiograph dated 05/22/2020. FINDINGS: Cardiovascular: There is no cardiomegaly or pericardial effusion. The thoracic aorta is unremarkable. The origins of the great vessels of the aortic arch appear patent. Evaluation of the pulmonary arteries is limited due to respiratory motion artifact and suboptimal opacification and timing of the contrast. The central pulmonary arteries appear patent. Mediastinum/Nodes: No hilar or mediastinal adenopathy. The esophagus and the thyroid gland are grossly unremarkable. No mediastinal fluid collection. Lungs/Pleura: The lungs are clear. There is no pleural effusion pneumothorax. The central airways are patent. Upper Abdomen: No acute abnormality. Musculoskeletal: No acute osseous pathology. There is a 2.8 x 2.4 cm low attenuating mass in the left breast adjacent and to the lateral of the nipple. Additional 4 x 4 cm mass noted in the lower outer quadrant of the left breast. Findings concerning for neoplastic process. Correlation with clinical exam and mammography studies recommended. IMPRESSION: 1. No acute  intrathoracic pathology. 2. Left breast masses concerning for neoplastic process. Correlation with clinical exam and mammography studies recommended. Electronically Signed   By: Anner Crete M.D.   On: 05/22/2020 21:15    Procedures Procedures   Medications Ordered in ED Medications  hydrALAZINE (APRESOLINE) injection 5 mg (5 mg Intravenous Given 05/22/20 2039)  iohexol (OMNIPAQUE) 300 MG/ML solution 75 mL (75 mLs Intravenous Contrast Given 05/22/20 2100)    ED Course  I have reviewed the triage vital signs and the nursing notes.  Pertinent labs & imaging results that were available during my care of the patient were reviewed by me and considered in my medical decision making (see chart for details).    MDM Rules/Calculators/A&P                         Patient with poorly controlled high blood pressure she will placed on labetalol.  Patient also with a left breast mass and she is referred to general surgery  Final Clinical Impression(s) / ED Diagnoses Final diagnoses:  Primary hypertension  Breast mass in female    Rx / DC Orders ED Discharge Orders         Ordered    labetalol (NORMODYNE) 100 MG tablet  2 times daily        05/22/20 2130           Milton Ferguson, MD 05/24/20 1629

## 2020-05-22 NOTE — Discharge Instructions (Addendum)
Call Dr. Constance Haw office on Monday and tell them you were in the emergency department and we felt like you need to be seen next week.  Follow-up with your family doctor for your blood pressure or you can follow-up with a cardiologist Dr. Harl Bowie or one of his partners in the next few weeks

## 2020-05-22 NOTE — ED Notes (Signed)
Pt transported to CT ?

## 2020-05-22 NOTE — ED Notes (Signed)
Pt ambulated to bathroom without difficulty. Gait steady. No increase in chest pain, SOB or dizziness.

## 2020-05-22 NOTE — ED Triage Notes (Signed)
Patient told registration that she has stroke symptoms, and was taken to the back where she stated her lips were numb and her chest hurt.

## 2020-08-28 ENCOUNTER — Emergency Department (HOSPITAL_COMMUNITY)
Admission: EM | Admit: 2020-08-28 | Discharge: 2020-08-28 | Disposition: A | Discharge status: Home / Self Care | Payer: Self-pay | Attending: Emergency Medicine | Admitting: Emergency Medicine

## 2020-08-28 ENCOUNTER — Other Ambulatory Visit: Payer: Self-pay

## 2020-08-28 DIAGNOSIS — I10 Essential (primary) hypertension: Secondary | ICD-10-CM | POA: Insufficient documentation

## 2020-08-28 DIAGNOSIS — W57XXXA Bitten or stung by nonvenomous insect and other nonvenomous arthropods, initial encounter: Secondary | ICD-10-CM | POA: Insufficient documentation

## 2020-08-28 DIAGNOSIS — R21 Rash and other nonspecific skin eruption: Secondary | ICD-10-CM | POA: Insufficient documentation

## 2020-08-28 DIAGNOSIS — F17210 Nicotine dependence, cigarettes, uncomplicated: Secondary | ICD-10-CM | POA: Insufficient documentation

## 2020-08-28 DIAGNOSIS — S1086XA Insect bite of other specified part of neck, initial encounter: Secondary | ICD-10-CM | POA: Insufficient documentation

## 2020-08-28 MED ORDER — DIPHENHYDRAMINE HCL 25 MG PO CAPS
50.0000 mg | ORAL_CAPSULE | Freq: Once | ORAL | Status: AC
  Administered 2020-08-28: 50 mg via ORAL
  Filled 2020-08-28: qty 2

## 2020-08-28 MED ORDER — DEXAMETHASONE 4 MG PO TABS
10.0000 mg | ORAL_TABLET | Freq: Once | ORAL | Status: AC
  Administered 2020-08-28: 10 mg via ORAL
  Filled 2020-08-28: qty 3

## 2020-08-28 NOTE — ED Provider Notes (Signed)
[facility] Emergency Department Provider Note MRN:  [id number]  Arrival date & time: 08/28/20     Chief Complaint   Insect Bite   History of Present Illness   Briana Soto is a 45 y.o. year-old female with a history of hypertension presenting to the ED with chief complaint of insect bite.  Patient was bit on the upper arm, possibly by mosquito yesterday evening.  This evening noted that the right arm was swelling lower down.  Some discomfort.  Decided to wear long sleeves outside today but then was bit by some other type of bug on the left side of the neck.  Noted swelling to the area with redness.  Denies any nausea vomiting or diarrhea, no shortness of breath, no facial tongue or lip swelling.  No new medications, no other complaints.  Review of Systems  A complete 10 system review of systems was obtained and all systems are negative except as noted in the HPI and PMH.   Patient's Health History    Past Medical History:  Diagnosis Date   Headache    Hypertension    Noncompliance     Past Surgical History:  Procedure Laterality Date   TUBAL LIGATION      Family History  Problem Relation Age of Onset   Hypertension Mother     Social History   Socioeconomic History   Marital status: Divorced    Spouse name: Not on file   Number of children: Not on file   Years of education: Not on file   Highest education level: Not on file  Occupational History   Not on file  Tobacco Use   Smoking status: Every Day    Packs/day: 1.00    Pack years: 0.00    Types: Cigarettes   Smokeless tobacco: Never  Substance and Sexual Activity   Alcohol use: Yes    Comment: at least 2 beers daily   Drug use: No   Sexual activity: Not on file  Other Topics Concern   Not on file  Social History Narrative   Not on file   Social Determinants of Health   Financial Resource Strain: Not on file  Food Insecurity: Not on file  Transportation Needs: Not on  file  Physical Activity: Not on file  Stress: Not on file  Social Connections: Not on file  Intimate Partner Violence: Not on file     Physical Exam   Vitals:   08/28/20 0332  BP: (!) 171/123  Pulse: 100  Resp: 18  Temp: 98.1 F (36.7 C)  SpO2: 100%    CONSTITUTIONAL: Well-appearing, NAD NEURO:  Alert and oriented x 3, no focal deficits EYES:  eyes equal and reactive ENT/NECK:  no LAD, no JVD CARDIO: Regular rate, well-perfused, normal S1 and S2 PULM:  CTAB no wheezing or rhonchi GI/GU:  normal bowel sounds, non-distended, non-tender MSK/SPINE:  No gross deformities, no edema SKIN: 2 to 3 cm slightly raised erythematous patch to the left lateral neck; 0.5 cm erythematous papule to the right shoulder with surrounding erythema PSYCH:  Appropriate speech and behavior  *Additional and/or pertinent findings included in MDM below  Diagnostic and Interventional Summary    EKG Interpretation  Date/Time:    Ventricular Rate:    PR Interval:    QRS Duration:   QT Interval:    QTC Calculation:   R Axis:     Text Interpretation:          Labs Reviewed -  No data to display  No orders to display    Medications  dexamethasone (DECADRON) tablet 10 mg (has no administration in time range)  diphenhydrAMINE (BENADRYL) capsule 50 mg (has no administration in time range)     Procedures  /  Critical Care Procedures  ED Course and Medical Decision Making  I have reviewed the triage vital signs, the nursing notes, and pertinent available records from the EMR.  Listed above are laboratory and imaging tests that I personally ordered, reviewed, and interpreted and then considered in my medical decision making (see below for details).  Exam is consistent with insect bites with surrounding inflammatory process likely related to allergic response.  I have little to no concern for an infectious process.  Patient is hypertensive but explains that these type of numbers are actually  good for her.  Routinely has systolics in the 409W.  She was advised of the importance of compliance with her high blood pressure medications.  She does admit to not taking the medication regularly.  Providing single dose steroid, Benadryl for comfort, appropriate for discharge.       Barth Kirks. Sedonia Small, [place] [email or web address]  Final Clinical Impressions(s) / ED Diagnoses     ICD-10-CM   1. Insect bite of other part of neck, initial encounter  J19.14NW    W57.XXXA     2. Rash  R21       ED Discharge Orders     None        Discharge Instructions Discussed with and Provided to Patient:    Discharge Instructions      You were evaluated in the Emergency Department and after careful evaluation, we did not find any emergent condition requiring admission or further testing in the hospital.  Your exam/testing today was overall reassuring.  Symptoms seem to be due to an allergic reaction related to a bug bite.  Recommend Benadryl over-the-counter every 4-6 hours for itching.  You can also use over-the-counter hydrocortisone cream on the affected areas.  Please return to the Emergency Department if you experience any worsening of your condition.  Thank you for allowing [place] to be a part of your care.        Maudie Flakes, MD 08/28/20 [phone]

## 2020-08-28 NOTE — ED Triage Notes (Signed)
Pt c/o insect bites to the right upper arm and left neck.

## 2020-08-28 NOTE — Discharge Instructions (Addendum)
You were evaluated in the Emergency Department and after careful evaluation, we did not find any emergent condition requiring admission or further testing in the hospital.  Your exam/testing today was overall reassuring.  Symptoms seem to be due to an allergic reaction related to a bug bite.  Recommend Benadryl over-the-counter every 4-6 hours for itching.  You can also use over-the-counter hydrocortisone cream on the affected areas.  Please return to the Emergency Department if you experience any worsening of your condition.  Thank you for allowing [place] to be a part of your care.

## 2020-10-16 ENCOUNTER — Encounter (HOSPITAL_COMMUNITY): Payer: Self-pay | Admitting: *Deleted

## 2020-10-16 ENCOUNTER — Other Ambulatory Visit: Payer: Self-pay

## 2020-10-16 ENCOUNTER — Emergency Department (HOSPITAL_COMMUNITY)
Admission: EM | Admit: 2020-10-16 | Discharge: 2020-10-16 | Disposition: A | Discharge status: Home / Self Care | Payer: Self-pay | Attending: Emergency Medicine | Admitting: Emergency Medicine

## 2020-10-16 DIAGNOSIS — I10 Essential (primary) hypertension: Secondary | ICD-10-CM | POA: Insufficient documentation

## 2020-10-16 DIAGNOSIS — S80862A Insect bite (nonvenomous), left lower leg, initial encounter: Secondary | ICD-10-CM | POA: Insufficient documentation

## 2020-10-16 DIAGNOSIS — W57XXXA Bitten or stung by nonvenomous insect and other nonvenomous arthropods, initial encounter: Secondary | ICD-10-CM | POA: Insufficient documentation

## 2020-10-16 DIAGNOSIS — F17210 Nicotine dependence, cigarettes, uncomplicated: Secondary | ICD-10-CM | POA: Insufficient documentation

## 2020-10-16 DIAGNOSIS — Z79899 Other long term (current) drug therapy: Secondary | ICD-10-CM | POA: Insufficient documentation

## 2020-10-16 MED ORDER — DIPHENHYDRAMINE-ZINC ACETATE 2-0.1 % EX CREA: 1.0000 "application " | TOPICAL_CREAM | Freq: Three times a day (TID) | CUTANEOUS | 0 refills | Status: DC | PRN

## 2020-10-16 MED ORDER — BACITRACIN ZINC 500 UNIT/GM EX OINT: 1.0000 "application " | TOPICAL_OINTMENT | Freq: Two times a day (BID) | CUTANEOUS | 0 refills | Status: DC

## 2020-10-16 NOTE — ED Provider Notes (Signed)
[facility] EMERGENCY DEPARTMENT Provider Note   CSN: [id number] Arrival date & time: 10/16/20  1501     History Chief Complaint  Patient presents with   Briana Soto is a 45 y.o. female.  She has a past medical history of hypertension.  Patient states that yesterday she was sitting in her backyard when she started feeling something bite her left leg.  She does not know what bit her.  She states that since then, her left leg has been very painful.  It has been slightly swollen and red.  She has 2 spots that she can see where her skin seems to have blistered.  She has only been treating it with Benadryl has not helped.  Any fever, chills, pain, shortness of breath, nausea, vomiting.  HPI     Past Medical History:  Diagnosis Date   Headache    Hypertension    Noncompliance     Patient Active Problem List   Diagnosis Date Noted   Hypertensive urgency 11/29/2017   Hypokalemia 11/29/2017   Tachycardia 11/29/2017    Past Surgical History:  Procedure Laterality Date   TUBAL LIGATION       OB History   No obstetric history on file.     Family History  Problem Relation Age of Onset   Hypertension Mother     Social History   Tobacco Use   Smoking status: Every Day    Packs/day: 1.00    Types: Cigarettes   Smokeless tobacco: Never  Substance Use Topics   Alcohol use: Yes    Comment: at least 2 beers daily   Drug use: No    Home Medications Prior to Admission medications   Medication Sig Start Date End Date Taking? Authorizing Provider  bacitracin ointment Apply 1 application topically 2 (two) times daily. 10/16/20  Yes Izzabelle Bouley, Adora Fridge, PA-C  diphenhydrAMINE-zinc acetate (BENADRYL EXTRA STRENGTH) cream Apply 1 application topically 3 (three) times daily as needed for itching. 10/16/20  Yes Tamanna Whitson, Adora Fridge, PA-C  amLODipine (NORVASC) 10 MG tablet Take 1 tablet (10 mg total) by mouth daily. 12/02/17 01/01/18  Manuella Ghazi, Pratik D, DO   hydrochlorothiazide (HYDRODIURIL) 25 MG tablet Take 1 tablet (25 mg total) by mouth daily. 12/02/17 04/01/18  Manuella Ghazi, Pratik D, DO  labetalol (NORMODYNE) 100 MG tablet Take 1 tablet (100 mg total) by mouth 2 (two) times daily. 05/22/20   Milton Ferguson, MD  LORazepam (ATIVAN) 0.5 MG tablet Take 1 tablet (0.5 mg total) by mouth every 6 (six) hours as needed for anxiety. 12/01/17   Manuella Ghazi, Pratik D, DO    Allergies    Codeine  Review of Systems   Review of Systems  Constitutional:  Negative for chills, fatigue and fever.  Respiratory:  Negative for shortness of breath.   Cardiovascular:  Negative for chest pain.  Gastrointestinal:  Negative for abdominal pain, nausea and vomiting.  Musculoskeletal:  Negative for myalgias.  Skin:  Positive for wound.       Bug bite to left calf  All other systems reviewed and are negative.  Physical Exam Updated Vital Signs BP (!) 168/120   Pulse (!) 101   Temp 98.5 F (36.9 C) (Oral)   Resp 20   SpO2 96%   Physical Exam Vitals and nursing note reviewed.  Constitutional:      General: She is not in acute distress.    Appearance: Normal appearance. She is not ill-appearing, toxic-appearing or diaphoretic.  HENT:  Head: Normocephalic and atraumatic.  Eyes:     General: No scleral icterus.       Right eye: No discharge.        Left eye: No discharge.     Conjunctiva/sclera: Conjunctivae normal.  Pulmonary:     Effort: Pulmonary effort is normal. No respiratory distress.  Musculoskeletal:     Right lower leg: No edema.     Left lower leg: No edema.  Skin:    General: Skin is warm and dry.     Findings: Erythema and lesion present.     Comments: One lesion noted to medial posterior calf. Blistered. Erythema around site.   One lesion noted to medial posterior knee. No erythema or swelling noted around site.   Neurological:     Mental Status: She is alert.  Psychiatric:        Mood and Affect: Mood normal.        Behavior: Behavior normal.     ED Results / Procedures / Treatments   Labs (all labs ordered are listed, but only abnormal results are displayed) Labs Reviewed - No data to display  EKG None  Radiology No results found.  Procedures Procedures   Medications Ordered in ED Medications - No data to display  ED Course  I have reviewed the triage vital signs and the nursing notes.  Pertinent labs & imaging results that were available during my care of the patient were reviewed by me and considered in my medical decision making (see chart for details).    MDM Rules/Calculators/A&P                         Patient presents with insect bite to left lower leg.  Vital signs are stable on arrival.  Bite is localized to the left calf.  Is is blistered over with erythema in the surrounding soft tissue.  Minimal swelling to left lower leg.  On exam it does appear that patient has been scratching the area, with some excoriated patches.  I will prescribe her a topical antibiotic given that she may have helping cellulitis. Patient is given return precautions if swelling and redness worsen.  I am so sending her home with a Benadryl cream for symptom relief.  Final Clinical Impression(s) / ED Diagnoses Final diagnoses:  Insect bite of left lower leg, initial encounter    Rx / DC Orders ED Discharge Orders          Ordered    diphenhydrAMINE-zinc acetate (BENADRYL EXTRA STRENGTH) cream  3 times daily PRN        10/16/20 1609    bacitracin ointment  2 times daily        10/16/20 1630             Taquanna Borras, Cherlyn Roberts 10/16/20 1705    Davonna Belling, MD 10/17/20 1446

## 2020-10-16 NOTE — Discharge Instructions (Addendum)
Please apply benadryl cream to the area.  Most insect bites will improve on their own.  You have also been prescribed bacitracin ointment which is a topical antibiotic. Please use this over the area in question twice a day. Please return if you developing worsening swelling or redness. Avoid scratching area because this puts you at further risk for infection.

## 2020-10-16 NOTE — ED Triage Notes (Signed)
Insect bite to left lower leg onset yesterday with swelling and redness. Patient has elevated blood pressure and does not take medication

## 2020-12-15 ENCOUNTER — Ambulatory Visit
Admission: RE | Admit: 2020-12-15 | Discharge: 2020-12-15 | Disposition: A | Discharge status: Home / Self Care | Payer: Self-pay | Source: Ambulatory Visit | Attending: Oncology | Admitting: Oncology

## 2020-12-15 ENCOUNTER — Other Ambulatory Visit: Payer: Self-pay

## 2020-12-15 ENCOUNTER — Ambulatory Visit: Payer: Self-pay | Attending: Oncology

## 2020-12-15 VITALS — BP 223/141 | HR 71 | Temp 97.2°F | Resp 18 | Wt 126.5 lb

## 2020-12-15 DIAGNOSIS — N63 Unspecified lump in unspecified breast: Secondary | ICD-10-CM | POA: Insufficient documentation

## 2020-12-15 DIAGNOSIS — Z0000 Encounter for general adult medical examination without abnormal findings: Secondary | ICD-10-CM

## 2020-12-15 NOTE — Progress Notes (Signed)
  Subjective:     Patient ID: Briana Soto, female   DOB: 1975-06-19, 45 y.o.   MRN: [id number]  HPI   Review of Systems     Objective:   Physical Exam Chest:  Breasts:    Right: Mass present. No swelling, bleeding, inverted nipple, nipple discharge, skin change or tenderness.     Left: Swelling, inverted nipple, mass and skin change present. No bleeding, nipple discharge or tenderness.       Comments: Soft right axillary mass; firm mobile nodularity uppe outer right breast; Firm hard left outer breast mass with nipple changes; left axillary adenopathy Genitourinary:    Labia:        Right: No rash, tenderness, lesion or injury.        Left: No rash, tenderness, lesion or injury.      Vagina: No signs of injury and foreign body. No vaginal discharge, erythema, tenderness, bleeding, lesions or prolapsed vaginal walls.     Cervix: No cervical motion tenderness, discharge, friability, lesion, erythema, cervical bleeding or eversion.     Uterus: Not deviated, not enlarged, not fixed, not tender and no uterine prolapse.      Adnexa:        Right: No mass, tenderness or fullness.         Left: No mass, tenderness or fullness.         Assessment:     45 year old patient presents to [facility] with complaint of lefy breast mass x 5 years.  Had cystic mass on left breast ultrasound in 2018, but did not return for diagnostic mammogram, and ultrasound.  Patient screened, and meets BCCCP eligibility.  Patient does not have insurance, Medicare or Medicaid.  Instructed patient on breast self awareness using teach back method .  Palpated a firm hard outer left breast mass with visible skin, and nipple changes; left axillary node palpated; right outer breast nodularity; soft right axillary mass.  Patient is experiencing extremely high blood pressures, and she does not take medication.  She has no primary provider.   Risk Assessment     Risk Scores       12/15/2020   Last edited by: Terri Skains S, CMA   5-year risk: 0.9 %   Lifetime risk: 9.2 %               Plan:     Sent to [facility] for bilateral diagnostic mammogram, and ultrasound;  Specimen collected for pap.  Patient and her friend state she will go to ED immediately following breast imaging to address.

## 2020-12-16 ENCOUNTER — Other Ambulatory Visit: Payer: Self-pay

## 2020-12-16 DIAGNOSIS — N63 Unspecified lump in unspecified breast: Secondary | ICD-10-CM

## 2020-12-17 ENCOUNTER — Other Ambulatory Visit: Payer: Self-pay | Admitting: Oncology

## 2020-12-17 DIAGNOSIS — N6320 Unspecified lump in the left breast, unspecified quadrant: Secondary | ICD-10-CM

## 2020-12-17 DIAGNOSIS — R928 Other abnormal and inconclusive findings on diagnostic imaging of breast: Secondary | ICD-10-CM

## 2020-12-20 LAB — IGP, APTIMA HPV: HPV Aptima: POSITIVE — AB

## 2020-12-24 ENCOUNTER — Ambulatory Visit
Admission: RE | Admit: 2020-12-24 | Discharge: 2020-12-24 | Disposition: A | Discharge status: Home / Self Care | Payer: Medicaid Other | Source: Ambulatory Visit | Attending: Oncology | Admitting: Oncology

## 2020-12-24 ENCOUNTER — Other Ambulatory Visit: Payer: Self-pay | Admitting: Oncology

## 2020-12-24 ENCOUNTER — Other Ambulatory Visit: Payer: Self-pay

## 2020-12-24 DIAGNOSIS — C50912 Malignant neoplasm of unspecified site of left female breast: Secondary | ICD-10-CM | POA: Diagnosis not present

## 2020-12-24 DIAGNOSIS — Z170 Estrogen receptor positive status [ER+]: Secondary | ICD-10-CM | POA: Diagnosis not present

## 2020-12-24 DIAGNOSIS — N63 Unspecified lump in unspecified breast: Secondary | ICD-10-CM

## 2020-12-24 DIAGNOSIS — C773 Secondary and unspecified malignant neoplasm of axilla and upper limb lymph nodes: Secondary | ICD-10-CM | POA: Insufficient documentation

## 2020-12-24 DIAGNOSIS — C50911 Malignant neoplasm of unspecified site of right female breast: Secondary | ICD-10-CM | POA: Insufficient documentation

## 2020-12-24 HISTORY — PX: BREAST BIOPSY: SHX20

## 2020-12-28 ENCOUNTER — Ambulatory Visit: Payer: Self-pay

## 2020-12-28 DIAGNOSIS — C50919 Malignant neoplasm of unspecified site of unspecified female breast: Secondary | ICD-10-CM

## 2020-12-29 ENCOUNTER — Other Ambulatory Visit: Payer: Self-pay

## 2020-12-29 ENCOUNTER — Inpatient Hospital Stay: Payer: Medicaid Other | Attending: Oncology | Admitting: Oncology

## 2020-12-29 ENCOUNTER — Encounter: Payer: Self-pay | Admitting: Surgery

## 2020-12-29 ENCOUNTER — Ambulatory Visit (INDEPENDENT_AMBULATORY_CARE_PROVIDER_SITE_OTHER): Payer: Medicaid Other | Admitting: Surgery

## 2020-12-29 ENCOUNTER — Ambulatory Visit: Payer: Self-pay | Admitting: Surgery

## 2020-12-29 ENCOUNTER — Encounter: Payer: Self-pay | Admitting: Oncology

## 2020-12-29 ENCOUNTER — Inpatient Hospital Stay: Payer: Medicaid Other

## 2020-12-29 VITALS — BP 191/129 | HR 97 | Temp 98.4°F | Ht 64.0 in | Wt 126.4 lb

## 2020-12-29 VITALS — BP 180/124 | HR 90 | Temp 97.8°F | Resp 20 | Wt 126.2 lb

## 2020-12-29 DIAGNOSIS — Z79899 Other long term (current) drug therapy: Secondary | ICD-10-CM | POA: Insufficient documentation

## 2020-12-29 DIAGNOSIS — F419 Anxiety disorder, unspecified: Secondary | ICD-10-CM | POA: Diagnosis not present

## 2020-12-29 DIAGNOSIS — C50412 Malignant neoplasm of upper-outer quadrant of left female breast: Secondary | ICD-10-CM | POA: Diagnosis not present

## 2020-12-29 DIAGNOSIS — C50811 Malignant neoplasm of overlapping sites of right female breast: Secondary | ICD-10-CM | POA: Diagnosis not present

## 2020-12-29 DIAGNOSIS — F17210 Nicotine dependence, cigarettes, uncomplicated: Secondary | ICD-10-CM | POA: Diagnosis not present

## 2020-12-29 DIAGNOSIS — R110 Nausea: Secondary | ICD-10-CM | POA: Insufficient documentation

## 2020-12-29 DIAGNOSIS — C50812 Malignant neoplasm of overlapping sites of left female breast: Secondary | ICD-10-CM | POA: Insufficient documentation

## 2020-12-29 DIAGNOSIS — G4700 Insomnia, unspecified: Secondary | ICD-10-CM | POA: Insufficient documentation

## 2020-12-29 DIAGNOSIS — C50919 Malignant neoplasm of unspecified site of unspecified female breast: Secondary | ICD-10-CM

## 2020-12-29 DIAGNOSIS — C50411 Malignant neoplasm of upper-outer quadrant of right female breast: Secondary | ICD-10-CM

## 2020-12-29 MED ORDER — ALPRAZOLAM 0.25 MG PO TABS: 0.2500 mg | ORAL_TABLET | Freq: Two times a day (BID) | ORAL | 0 refills | Status: DC | PRN

## 2020-12-29 MED ORDER — PROCHLORPERAZINE MALEATE 10 MG PO TABS: 10.0000 mg | ORAL_TABLET | Freq: Four times a day (QID) | ORAL | 2 refills | Status: DC | PRN

## 2020-12-29 NOTE — Addendum Note (Signed)
Addended by: Wilford Corner on: 12/29/2020 03:47 PM   Modules accepted: Orders

## 2020-12-29 NOTE — Progress Notes (Signed)
Patient ID: Briana Soto, female   DOB: 1975/03/13, 45 y.o.   MRN: [id number]  Chief Complaint: Bilateral breast cancer  History of Present Illness Briana Soto is a 45 y.o. female with recently diagnosed bilateral breast cancer, from ultrasound-guided core biopsies.  Left axillary lymphadenopathy with biopsy-proven left breast cancer, prognostic indicators pending.  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  Is been noted on previous imaging, and severe hypertension has seemed to overshadow further work-up.  She reports that undergoing menses at the age of 29, reporting 6 pregnancies, the first was terminated.  She never breast-fed for living female children.  She has had no recent nipple discharge.  She has had skin changes and breast pain.  Large mass is lateral and posterior to the known retroareolar mass which was a chronic abscess/cyst.  She recently underwent ultrasound exams which also showed a right sided breast mass of about 1 cm in size suspicious features noted.  She has had a longstanding dermal cyst in the right axilla.  Past Medical History Past Medical History:  Diagnosis Date   Headache    Hypertension    Noncompliance       Past Surgical History:  Procedure Laterality Date   BREAST BIOPSY Right 12/24/2020   mass, 9:00 8 cmfn venus marker, path pending   BREAST BIOPSY Left 12/24/2020   mass 3:00 retroareolar, ribbon, path pending   BREAST BIOPSY Left 12/24/2020   mass 3:00 5cmfn, heart marker, path pending   BREAST BIOPSY Left 12/24/2020   axilla LN, hyrdomarker shape 3 coil, path pending   TUBAL LIGATION      Allergies  Allergen Reactions   Codeine Itching and Nausea And Vomiting    Current Outpatient Medications  Medication Sig Dispense Refill   bacitracin ointment Apply 1 application topically 2 (two) times daily. 120 g 0   diphenhydrAMINE-zinc acetate (BENADRYL EXTRA STRENGTH) cream Apply 1  application topically 3 (three) times daily as needed for itching. 28.4 g 0   labetalol (NORMODYNE) 100 MG tablet Take 1 tablet (100 mg total) by mouth 2 (two) times daily. 60 tablet 1   No current facility-administered medications for this visit.    Family History Family History  Problem Relation Age of Onset   COPD Mother    Hypertension Mother    Heart disease Father       Social History Social History   Tobacco Use   Smoking status: Every Day    Packs/day: 1.50    Types: Cigarettes   Smokeless tobacco: Never  Substance Use Topics   Alcohol use: Yes    Comment: at least 2 beers daily   Drug use: No        Review of Systems  All other systems reviewed and are negative.   Physical Exam Blood pressure (!) 191/129, pulse 97, temperature 98.4 F (36.9 C), temperature source Oral, height _0  (1.626 m), weight 126 lb 6.4 oz (57.3 kg), SpO2 98 %. Last Weight  Most recent update: 12/29/2020 11:31 AM    Weight  57.3 kg (126 lb 6.4 oz)             CONSTITUTIONAL: Well developed, and nourished, appropriately responsive and aware without distress.   EYES: Sclera non-icteric.   EARS, NOSE, MOUTH AND THROAT: Mask worn.    Hearing is intact to voice.  NECK: Trachea is midline, and there is no jugular venous distension.  LYMPH NODES:  Lymph  nodes in the neck are not enlarged. RESPIRATORY:  Lungs are clear, and breath sounds are equal bilaterally. Normal respiratory effort without pathologic use of accessory muscles. CARDIOVASCULAR: Heart is regular in rate and rhythm. GI: The abdomen is  soft, nontender, and nondistended.  GU: Freda Munro is present as chaperone: There are Steri-Strips in the lateral right breast overlying the core biopsy site.  The underlying mass is tender yet mobile.  Right axilla is notable for a large dermal cyst anteriorly. Left breast nipple areolar complex is deformed from the known chronic abscess/cystic process.  Or lateral posterior to this is an  easily identifiable mass 5 cm, there is also an axillary masses, adjacent with it also Steri-Strips denoting their recent biopsy.  These areas are all tender from recent biopsy. MUSCULOSKELETAL:  Symmetrical muscle tone appreciated in all four extremities.    SKIN: Skin turgor is normal. No pathologic skin lesions appreciated.  NEUROLOGIC:  Motor and sensation appear grossly normal.  Cranial nerves are grossly without defect. PSYCH:  Alert and oriented to person, place and time. Affect is appropriate for situation.  Data Reviewed I have personally reviewed what is currently available of the patient's imaging, recent labs and medical records.   Labs:  CBC Latest Ref Rng & Units 05/22/2020 12/01/2017 11/30/2017  WBC 4.0 - 10.5 K/uL 5.5 5.0 4.7  Hemoglobin 12.0 - 15.0 g/dL 12.4 11.7(L) 11.1(L)  Hematocrit 36.0 - 46.0 % 40.1 37.7 35.8(L)  Platelets 150 - 400 K/uL 250 315 298   CMP Latest Ref Rng & Units 05/22/2020 12/01/2017 11/30/2017  Glucose 70 - 99 mg/dL 110(H) 97 79  BUN 6 - 20 mg/dL 5(L) 7 5(L)  Creatinine 0.44 - 1.00 mg/dL 0.65 0.65 0.61  Sodium 135 - 145 mmol/L 134(L) 135 141  Potassium 3.5 - 5.1 mmol/L 3.3(L) 3.1(L) 3.1(L)  Chloride 98 - 111 mmol/L 98 99 107  CO2 22 - 32 mmol/L _0 Calcium 8.9 - 10.3 mg/dL 9.3 9.2 8.6(L)  Total Protein 6.5 - 8.1 g/dL 8.6(H) - 7.3  Total Bilirubin 0.3 - 1.2 mg/dL 0.4 - 0.7  Alkaline Phos 38 - 126 U/L 72 - 61  AST 15 - 41 U/L 96(H) - 21  ALT 0 - 44 U/L 95(H) - 18   SURGICAL PATHOLOGY  CASE: [phone]  PATIENT: Briana Soto  Surgical Pathology Report      Specimen Submitted:  A. Breast, left, retroareloar, ribbon  B. Breast, left, 5 CMFN, heart  C. Lymph node, left axilla  D. Breast, right, venus   Clinical History: Site 1:  Left breast mass 3:00 retro areolar (ribbon).  Site 2:  Left breast mass 3:00 5 CMFN (heart).  Site 3:  left axilla  (hydro mark).  Site 4:  Right breast mass 9:00 (venus)       DIAGNOSIS:  A.  BREAST, LEFT RETROAREOLAR 3:00; ULTRASOUND-GUIDED BIOPSY:  - PREDOMINANTLY KERATINACEOUS DEBRIS.  - SINGLE BIOPSY FRAGMENT OF UNREMARKABLE SKIN.  - NEGATIVE FOR ATYPIA AND MALIGNANCY.  - SEE COMMENT.   B.  BREAST, LEFT 3:00 5 CM FN; ULTRASOUND-GUIDED BIOPSY:   - INVASIVE MAMMARY CARCINOMA, NO SPECIAL TYPE, WITH PLEOMORPHIC LOBULAR  FEATURES.  Size of invasive carcinoma: 11 mm in this sample  Histologic grade of invasive carcinoma: Grade 3                       Glandular/tubular differentiation score: 3  Nuclear pleomorphism score: 3                       Mitotic rate score: 2                       Total score: 8  Ductal carcinoma in situ: Not identified   C.  LYMPH NODE, LEFT AXILLA; ULTRASOUND-GUIDED BIOPSY:  - METASTATIC CARCINOMA MORPHOLOGICALLY CONSISTENT WITH CARCINOMA SAMPLED  IN PART B INVOLVING A LYMPH NODE.   D.  BREAST, RIGHT 9:00 8 CM FN; ULTRASOUND-GUIDED BIOPSY:  - INVASIVE MAMMARY CARCINOMA, NO SPECIAL TYPE, WITH LOBULAR FEATURES.  Size of invasive carcinoma: 5 mm in this sample  Histologic grade of invasive carcinoma: Grade 2                       Glandular/tubular differentiation score: 3                       Nuclear pleomorphism score: 2                       Mitotic rate score: 1                       Total score: 6  Ductal carcinoma in situ: Not identified  Lymphovascular invasion: Not identified   Comment:  The histologic findings in part A (left retroareolar biopsy at 3:00) may  represent a benign cutaneous cyst (e.g. inclusion cyst). Correlation  with clinical and radiographic findings is required.   The carcinomas from the left (part B) and right (part D) breast are  morphologically dissimilar. The definitive grade will be assigned on the  excisional specimens.  ER/PR/HER2: Immunohistochemistry will be performed on blocks B1 and D1,  with reflex to Christine for HER2 2+. The results will be reported in an  addendum.     Imaging: CLINICAL DATA:  Palpable abnormality in the LEFT breast for proximally 1 year. History of LEFT breast abscess.   EXAM: DIGITAL DIAGNOSTIC BILATERAL MAMMOGRAM WITH TOMOSYNTHESIS AND CAD; ULTRASOUND LEFT BREAST LIMITED; ULTRASOUND RIGHT BREAST LIMITED   TECHNIQUE: Bilateral digital diagnostic mammography and breast tomosynthesis was performed. The images were evaluated with computer-aided detection.; Targeted ultrasound examination of the left breast was performed.; Targeted ultrasound examination of the right breast was performed   COMPARISON:  None.   ACR Breast Density Category c: The breast tissue is heterogeneously dense, which may obscure small masses.   FINDINGS: RIGHT BREAST:   Mammogram: Within the LATERAL portion of the RIGHT breast there is a partially obscured oval mass further evaluated with ultrasound. Spot tangential view of palpable mass in the UPPER RIGHT axilla shows superficial circumscribed mass. Mammographic images were processed with CAD.   Physical Exam: There is a visible, protuberant mass in the UPPER RIGHT axilla, superficial to the humeral head. The patient is able to express a small amount of fluid from numerous sites on the dome of the mass. She has a history of being able to decompress the mass by expressing fluid over the last 5 years.   I palpate soft nodularity without discrete mass in the LATERAL aspect of the RIGHT breast.   Ultrasound: Targeted ultrasound is performed, showing a large circumscribed oval parallel mass with posterior acoustic enhancement corresponding to the area of patient's concern in the UPPER RIGHT axilla. Mass measures 2.6 x 3.0 x 1.2 centimeters.  Internal blood flow confirmed on Doppler evaluation. Mass is superficial. In the LOWER axilla, lymph nodes with normal morphology are imaged.   In the 9 o'clock location 8 centimeters from the nipple, there is an irregular taller than wide mass measuring 0.9 x  0.7 x 0.7 centimeters. Mass is vascular on Doppler evaluation.   LEFT BREAST:   Mammogram: There is a large, partially obscured mass in the LATERAL aspect of the LEFT breast corresponding to the area of patient's concern. In the LATERAL periareolar region of the LEFT breast, there is a circumscribed mass associated with distortion of the nipple. Enlarged LEFT axillary lymph nodes are also identified. Mammographic images were processed with CAD.   Physical Exam: I palpate a large mass in the 2-3 o'clock location of the LEFT breast corresponding to the area of concern. I palpate an oval mobile mass just LATERAL to the LEFT nipple and distorting the nipple areolar complex. I palpate 2 discrete firm nodules in the LOWER LEFT axilla.   Ultrasound: Targeted ultrasound is performed, showing an oval mass with indistinct and angular margins in the 3 o'clock location of the LEFT breast 5 centimeters from the nipple. Mass measures 5.1 x 2.6 x 4.6 centimeters and contains internal blood flow by Doppler evaluation.   In the 1 o'clock location 3 centimeters from the nipple, a small satellite lesion is seen adjacent to this larger lesion. This mass contains internal blood flow and measures 0.9 x 0.8 x 1.0 centimeters.   In the retroareolar region of the RIGHT breast, large circumscribed oval mass is 3.5 x 1.8 x 3 1 centimeter. Internal blood flow is confirmed on Doppler evaluation.   In the LEFT axilla, there are at least 4 enlarged LEFT axillary lymph nodes. The two LOWER lymph nodes are palpable and have cortical thickening of up to 6 millimeters.   IMPRESSION: 1. RIGHT breast: Palpable RIGHT axillary mass is consistent with benign intradermal inclusion cyst. Suspicious mass in the 9 o'clock location of the RIGHT breast for which biopsy is recommended. No RIGHT axillary adenopathy. 2. LEFT breast: Suspicious mass in the 3 o'clock location of the LEFT breast for which biopsy is  recommended. Small satellite lesion adjacent to this mass is also suspicious. Circumscribed mass deforming the LEFT nipple areolar complex may be benign but warrants biopsy. Four enlarged LEFT axillary lymph nodes.   RECOMMENDATION: 1. RIGHT breast: Recommend ultrasound-guided core biopsy of mass in the 9 o'clock location the RIGHT breast. Recommend clinical follow-up of RIGHT axillary mass, likely representing intradermal inclusion cyst. 2. LEFT breast: Recommend ultrasound-guided core biopsy of mass in the 3 o'clock location of the LEFT breast. Recommend ultrasound-guided core biopsy of mass in the LATERAL retroareolar region of the LEFT breast. Recommend ultrasound-guided core biopsy of 1 of the LOWER LEFT axillary lymph nodes. 3. Plan is to have patient return on 2 separate days for the 4 biopsies needed.   I have discussed the findings and recommendations with the patient. If applicable, a reminder letter will be sent to the patient regarding the next appointment.   BI-RADS CATEGORY  5: Highly suggestive of malignancy.     Electronically Signed   By: Nolon Nations M.D.   On: 12/15/2020 11:32  Within last 24 hrs: No results found.  Assessment    Bilateral breast cancer, prognostic indicators pending. Left axillary lymphadenopathy with biopsy-proven metastatic left breast cancer. Patient Active Problem List   Diagnosis Date Noted   Hypertensive urgency 11/29/2017   Hypokalemia 11/29/2017  Tachycardia 11/29/2017   Anxiety 09/29/2012   Essential hypertension 09/29/2012   Tobacco use 09/29/2012    Plan    I spent more time today discussing with Briana Soto the need for early systemic treatment as opposed to local control.  We discussed with her the role for Port-A-Cath, and she seems familiar with this.  We discussed the risks of proceeding with Port-A-Cath placement which include but are not limited to anesthesia, bleeding, infection, dysfunction.  There  is a potential for adjacent injuries of veins arteries lung soft tissue etc.  I believe she desires to proceed with treatment, she is seeing Dr. Grayland Ormond this afternoon, I made her aware that we would coordinate further care together.  Face-to-face time spent with the patient and accompanying care providers(if present) was 50 minutes, with more than 50% of the time spent counseling, educating, and coordinating care of the patient.    These notes generated with voice recognition software. I apologize for typographical errors.  Ronny Bacon M.D., FACS 12/29/2020, 11:37 AM

## 2020-12-29 NOTE — H&P (View-Only) (Signed)
Patient ID: Briana Soto, female   DOB: 1975/03/13, 45 y.o.   MRN: [id number]  Chief Complaint: Bilateral breast cancer  History of Present Illness Briana Soto is a 45 y.o. female with recently diagnosed bilateral breast cancer, from ultrasound-guided core biopsies.  Left axillary lymphadenopathy with biopsy-proven left breast cancer, prognostic indicators pending.  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  Is been noted on previous imaging, and severe hypertension has seemed to overshadow further work-up.  She reports that undergoing menses at the age of 29, reporting 6 pregnancies, the first was terminated.  She never breast-fed for living female children.  She has had no recent nipple discharge.  She has had skin changes and breast pain.  Large mass is lateral and posterior to the known retroareolar mass which was a chronic abscess/cyst.  She recently underwent ultrasound exams which also showed a right sided breast mass of about 1 cm in size suspicious features noted.  She has had a longstanding dermal cyst in the right axilla.  Past Medical History Past Medical History:  Diagnosis Date   Headache    Hypertension    Noncompliance       Past Surgical History:  Procedure Laterality Date   BREAST BIOPSY Right 12/24/2020   mass, 9:00 8 cmfn venus marker, path pending   BREAST BIOPSY Left 12/24/2020   mass 3:00 retroareolar, ribbon, path pending   BREAST BIOPSY Left 12/24/2020   mass 3:00 5cmfn, heart marker, path pending   BREAST BIOPSY Left 12/24/2020   axilla LN, hyrdomarker shape 3 coil, path pending   TUBAL LIGATION      Allergies  Allergen Reactions   Codeine Itching and Nausea And Vomiting    Current Outpatient Medications  Medication Sig Dispense Refill   bacitracin ointment Apply 1 application topically 2 (two) times daily. 120 g 0   diphenhydrAMINE-zinc acetate (BENADRYL EXTRA STRENGTH) cream Apply 1  application topically 3 (three) times daily as needed for itching. 28.4 g 0   labetalol (NORMODYNE) 100 MG tablet Take 1 tablet (100 mg total) by mouth 2 (two) times daily. 60 tablet 1   No current facility-administered medications for this visit.    Family History Family History  Problem Relation Age of Onset   COPD Mother    Hypertension Mother    Heart disease Father       Social History Social History   Tobacco Use   Smoking status: Every Day    Packs/day: 1.50    Types: Cigarettes   Smokeless tobacco: Never  Substance Use Topics   Alcohol use: Yes    Comment: at least 2 beers daily   Drug use: No        Review of Systems  All other systems reviewed and are negative.   Physical Exam Blood pressure (!) 191/129, pulse 97, temperature 98.4 F (36.9 C), temperature source Oral, height _0  (1.626 m), weight 126 lb 6.4 oz (57.3 kg), SpO2 98 %. Last Weight  Most recent update: 12/29/2020 11:31 AM    Weight  57.3 kg (126 lb 6.4 oz)             CONSTITUTIONAL: Well developed, and nourished, appropriately responsive and aware without distress.   EYES: Sclera non-icteric.   EARS, NOSE, MOUTH AND THROAT: Mask worn.    Hearing is intact to voice.  NECK: Trachea is midline, and there is no jugular venous distension.  LYMPH NODES:  Lymph  nodes in the neck are not enlarged. RESPIRATORY:  Lungs are clear, and breath sounds are equal bilaterally. Normal respiratory effort without pathologic use of accessory muscles. CARDIOVASCULAR: Heart is regular in rate and rhythm. GI: The abdomen is  soft, nontender, and nondistended.  GU: Freda Munro is present as chaperone: There are Steri-Strips in the lateral right breast overlying the core biopsy site.  The underlying mass is tender yet mobile.  Right axilla is notable for a large dermal cyst anteriorly. Left breast nipple areolar complex is deformed from the known chronic abscess/cystic process.  Or lateral posterior to this is an  easily identifiable mass 5 cm, there is also an axillary masses, adjacent with it also Steri-Strips denoting their recent biopsy.  These areas are all tender from recent biopsy. MUSCULOSKELETAL:  Symmetrical muscle tone appreciated in all four extremities.    SKIN: Skin turgor is normal. No pathologic skin lesions appreciated.  NEUROLOGIC:  Motor and sensation appear grossly normal.  Cranial nerves are grossly without defect. PSYCH:  Alert and oriented to person, place and time. Affect is appropriate for situation.  Data Reviewed I have personally reviewed what is currently available of the patient's imaging, recent labs and medical records.   Labs:  CBC Latest Ref Rng & Units 05/22/2020 12/01/2017 11/30/2017  WBC 4.0 - 10.5 K/uL 5.5 5.0 4.7  Hemoglobin 12.0 - 15.0 g/dL 12.4 11.7(L) 11.1(L)  Hematocrit 36.0 - 46.0 % 40.1 37.7 35.8(L)  Platelets 150 - 400 K/uL 250 315 298   CMP Latest Ref Rng & Units 05/22/2020 12/01/2017 11/30/2017  Glucose 70 - 99 mg/dL 110(H) 97 79  BUN 6 - 20 mg/dL 5(L) 7 5(L)  Creatinine 0.44 - 1.00 mg/dL 0.65 0.65 0.61  Sodium 135 - 145 mmol/L 134(L) 135 141  Potassium 3.5 - 5.1 mmol/L 3.3(L) 3.1(L) 3.1(L)  Chloride 98 - 111 mmol/L 98 99 107  CO2 22 - 32 mmol/L _0 Calcium 8.9 - 10.3 mg/dL 9.3 9.2 8.6(L)  Total Protein 6.5 - 8.1 g/dL 8.6(H) - 7.3  Total Bilirubin 0.3 - 1.2 mg/dL 0.4 - 0.7  Alkaline Phos 38 - 126 U/L 72 - 61  AST 15 - 41 U/L 96(H) - 21  ALT 0 - 44 U/L 95(H) - 18   SURGICAL PATHOLOGY  CASE: [phone]  PATIENT: Briana Soto  Surgical Pathology Report      Specimen Submitted:  A. Breast, left, retroareloar, ribbon  B. Breast, left, 5 CMFN, heart  C. Lymph node, left axilla  D. Breast, right, venus   Clinical History: Site 1:  Left breast mass 3:00 retro areolar (ribbon).  Site 2:  Left breast mass 3:00 5 CMFN (heart).  Site 3:  left axilla  (hydro mark).  Site 4:  Right breast mass 9:00 (venus)       DIAGNOSIS:  A.  BREAST, LEFT RETROAREOLAR 3:00; ULTRASOUND-GUIDED BIOPSY:  - PREDOMINANTLY KERATINACEOUS DEBRIS.  - SINGLE BIOPSY FRAGMENT OF UNREMARKABLE SKIN.  - NEGATIVE FOR ATYPIA AND MALIGNANCY.  - SEE COMMENT.   B.  BREAST, LEFT 3:00 5 CM FN; ULTRASOUND-GUIDED BIOPSY:   - INVASIVE MAMMARY CARCINOMA, NO SPECIAL TYPE, WITH PLEOMORPHIC LOBULAR  FEATURES.  Size of invasive carcinoma: 11 mm in this sample  Histologic grade of invasive carcinoma: Grade 3                       Glandular/tubular differentiation score: 3  Nuclear pleomorphism score: 3                       Mitotic rate score: 2                       Total score: 8  Ductal carcinoma in situ: Not identified   C.  LYMPH NODE, LEFT AXILLA; ULTRASOUND-GUIDED BIOPSY:  - METASTATIC CARCINOMA MORPHOLOGICALLY CONSISTENT WITH CARCINOMA SAMPLED  IN PART B INVOLVING A LYMPH NODE.   D.  BREAST, RIGHT 9:00 8 CM FN; ULTRASOUND-GUIDED BIOPSY:  - INVASIVE MAMMARY CARCINOMA, NO SPECIAL TYPE, WITH LOBULAR FEATURES.  Size of invasive carcinoma: 5 mm in this sample  Histologic grade of invasive carcinoma: Grade 2                       Glandular/tubular differentiation score: 3                       Nuclear pleomorphism score: 2                       Mitotic rate score: 1                       Total score: 6  Ductal carcinoma in situ: Not identified  Lymphovascular invasion: Not identified   Comment:  The histologic findings in part A (left retroareolar biopsy at 3:00) may  represent a benign cutaneous cyst (e.g. inclusion cyst). Correlation  with clinical and radiographic findings is required.   The carcinomas from the left (part B) and right (part D) breast are  morphologically dissimilar. The definitive grade will be assigned on the  excisional specimens.  ER/PR/HER2: Immunohistochemistry will be performed on blocks B1 and D1,  with reflex to Christine for HER2 2+. The results will be reported in an  addendum.     Imaging: CLINICAL DATA:  Palpable abnormality in the LEFT breast for proximally 1 year. History of LEFT breast abscess.   EXAM: DIGITAL DIAGNOSTIC BILATERAL MAMMOGRAM WITH TOMOSYNTHESIS AND CAD; ULTRASOUND LEFT BREAST LIMITED; ULTRASOUND RIGHT BREAST LIMITED   TECHNIQUE: Bilateral digital diagnostic mammography and breast tomosynthesis was performed. The images were evaluated with computer-aided detection.; Targeted ultrasound examination of the left breast was performed.; Targeted ultrasound examination of the right breast was performed   COMPARISON:  None.   ACR Breast Density Category c: The breast tissue is heterogeneously dense, which may obscure small masses.   FINDINGS: RIGHT BREAST:   Mammogram: Within the LATERAL portion of the RIGHT breast there is a partially obscured oval mass further evaluated with ultrasound. Spot tangential view of palpable mass in the UPPER RIGHT axilla shows superficial circumscribed mass. Mammographic images were processed with CAD.   Physical Exam: There is a visible, protuberant mass in the UPPER RIGHT axilla, superficial to the humeral head. The patient is able to express a small amount of fluid from numerous sites on the dome of the mass. She has a history of being able to decompress the mass by expressing fluid over the last 5 years.   I palpate soft nodularity without discrete mass in the LATERAL aspect of the RIGHT breast.   Ultrasound: Targeted ultrasound is performed, showing a large circumscribed oval parallel mass with posterior acoustic enhancement corresponding to the area of patient's concern in the UPPER RIGHT axilla. Mass measures 2.6 x 3.0 x 1.2 centimeters.  Internal blood flow confirmed on Doppler evaluation. Mass is superficial. In the LOWER axilla, lymph nodes with normal morphology are imaged.   In the 9 o'clock location 8 centimeters from the nipple, there is an irregular taller than wide mass measuring 0.9 x  0.7 x 0.7 centimeters. Mass is vascular on Doppler evaluation.   LEFT BREAST:   Mammogram: There is a large, partially obscured mass in the LATERAL aspect of the LEFT breast corresponding to the area of patient's concern. In the LATERAL periareolar region of the LEFT breast, there is a circumscribed mass associated with distortion of the nipple. Enlarged LEFT axillary lymph nodes are also identified. Mammographic images were processed with CAD.   Physical Exam: I palpate a large mass in the 2-3 o'clock location of the LEFT breast corresponding to the area of concern. I palpate an oval mobile mass just LATERAL to the LEFT nipple and distorting the nipple areolar complex. I palpate 2 discrete firm nodules in the LOWER LEFT axilla.   Ultrasound: Targeted ultrasound is performed, showing an oval mass with indistinct and angular margins in the 3 o'clock location of the LEFT breast 5 centimeters from the nipple. Mass measures 5.1 x 2.6 x 4.6 centimeters and contains internal blood flow by Doppler evaluation.   In the 1 o'clock location 3 centimeters from the nipple, a small satellite lesion is seen adjacent to this larger lesion. This mass contains internal blood flow and measures 0.9 x 0.8 x 1.0 centimeters.   In the retroareolar region of the RIGHT breast, large circumscribed oval mass is 3.5 x 1.8 x 3 1 centimeter. Internal blood flow is confirmed on Doppler evaluation.   In the LEFT axilla, there are at least 4 enlarged LEFT axillary lymph nodes. The two LOWER lymph nodes are palpable and have cortical thickening of up to 6 millimeters.   IMPRESSION: 1. RIGHT breast: Palpable RIGHT axillary mass is consistent with benign intradermal inclusion cyst. Suspicious mass in the 9 o'clock location of the RIGHT breast for which biopsy is recommended. No RIGHT axillary adenopathy. 2. LEFT breast: Suspicious mass in the 3 o'clock location of the LEFT breast for which biopsy is  recommended. Small satellite lesion adjacent to this mass is also suspicious. Circumscribed mass deforming the LEFT nipple areolar complex may be benign but warrants biopsy. Four enlarged LEFT axillary lymph nodes.   RECOMMENDATION: 1. RIGHT breast: Recommend ultrasound-guided core biopsy of mass in the 9 o'clock location the RIGHT breast. Recommend clinical follow-up of RIGHT axillary mass, likely representing intradermal inclusion cyst. 2. LEFT breast: Recommend ultrasound-guided core biopsy of mass in the 3 o'clock location of the LEFT breast. Recommend ultrasound-guided core biopsy of mass in the LATERAL retroareolar region of the LEFT breast. Recommend ultrasound-guided core biopsy of 1 of the LOWER LEFT axillary lymph nodes. 3. Plan is to have patient return on 2 separate days for the 4 biopsies needed.   I have discussed the findings and recommendations with the patient. If applicable, a reminder letter will be sent to the patient regarding the next appointment.   BI-RADS CATEGORY  5: Highly suggestive of malignancy.     Electronically Signed   By: Nolon Nations M.D.   On: 12/15/2020 11:32  Within last 24 hrs: No results found.  Assessment    Bilateral breast cancer, prognostic indicators pending. Left axillary lymphadenopathy with biopsy-proven metastatic left breast cancer. Patient Active Problem List   Diagnosis Date Noted   Hypertensive urgency 11/29/2017   Hypokalemia 11/29/2017  Tachycardia 11/29/2017   Anxiety 09/29/2012   Essential hypertension 09/29/2012   Tobacco use 09/29/2012    Plan    I spent more time today discussing with Briana Soto the need for early systemic treatment as opposed to local control.  We discussed with her the role for Port-A-Cath, and she seems familiar with this.  We discussed the risks of proceeding with Port-A-Cath placement which include but are not limited to anesthesia, bleeding, infection, dysfunction.  There  is a potential for adjacent injuries of veins arteries lung soft tissue etc.  I believe she desires to proceed with treatment, she is seeing Dr. Grayland Ormond this afternoon, I made her aware that we would coordinate further care together.  Face-to-face time spent with the patient and accompanying care providers(if present) was 50 minutes, with more than 50% of the time spent counseling, educating, and coordinating care of the patient.    These notes generated with voice recognition software. I apologize for typographical errors.  Ronny Bacon M.D., FACS 12/29/2020, 11:37 AM

## 2020-12-29 NOTE — Progress Notes (Signed)
Patient states she would like a prescription for her anxiety because it's really bad.

## 2020-12-29 NOTE — Progress Notes (Signed)
[place]  Telephone:([phone] [phone]  ID: [name] OB: Jul 25, 1975  MR#: [id number]  YJE#:[id number]  Patient Care Team: Patient, [name] as PCP - General ([place]) Rico Junker, RN as Registered Nurse Theodore Demark, RN as Registered Nurse  CHIEF COMPLAINT: Bilateral breast cancer.  INTERVAL HISTORY: Patient is a 45 year old female who noticed a change in the shape of her left breast several months ago.  More recently, she was evaluated in [facility] clinic and subsequently underwent bilateral mammogram and ultrasound revealing invasive carcinoma in both breasts.  It is unclear if this is 2 distinct primaries or metastatic disease.  Patient is highly anxious and has nausea, but otherwise feels well.  She has no neurologic complaints.  She denies any recent fevers or illnesses.  She has a fair appetite, but denies weight loss.  She has no chest pain, shortness of breath, cough, or hemoptysis.  She denies any nausea, vomiting, constipation, or diarrhea.  She has no urinary complaints.  Patient offers no further specific complaints today.  REVIEW OF SYSTEMS:   Review of Systems  Constitutional: Negative.  Negative for chills, fever and malaise/fatigue.  Respiratory: Negative.  Negative for cough, hemoptysis and shortness of breath.   Cardiovascular: Negative.  Negative for chest pain and leg swelling.  Gastrointestinal:  Positive for nausea. Negative for abdominal pain.  Genitourinary: Negative.  Negative for dysuria.  Musculoskeletal: Negative.  Negative for back pain.  Skin: Negative.  Negative for rash.  Neurological: Negative.  Negative for dizziness, focal weakness, weakness and headaches.  Psychiatric/Behavioral:  The patient is nervous/anxious and has insomnia.    As per HPI. Otherwise, a complete review of systems is negative.  PAST MEDICAL HISTORY: Past Medical History:  Diagnosis Date   Headache     Hypertension    Noncompliance     PAST SURGICAL HISTORY: Past Surgical History:  Procedure Laterality Date   BREAST BIOPSY Right 12/24/2020   mass, 9:00 8 cmfn venus marker, path pending   BREAST BIOPSY Left 12/24/2020   mass 3:00 retroareolar, ribbon, path pending   BREAST BIOPSY Left 12/24/2020   mass 3:00 5cmfn, heart marker, path pending   BREAST BIOPSY Left 12/24/2020   axilla LN, hyrdomarker shape 3 coil, path pending   TUBAL LIGATION      FAMILY HISTORY: Family History  Problem Relation Age of Onset   COPD Mother    Hypertension Mother    Heart disease Father     ADVANCED DIRECTIVES (Y/N):  N  HEALTH MAINTENANCE: Social History   Tobacco Use   Smoking status: Every Day    Packs/day: 1.50    Types: Cigarettes   Smokeless tobacco: Never  Substance Use Topics   Alcohol use: Yes    Comment: at least 2 beers daily   Drug use: No     Colonoscopy:  PAP:  Bone density:  Lipid panel:  Allergies  Allergen Reactions   Codeine Itching and Nausea And Vomiting    Current Outpatient Medications  Medication Sig Dispense Refill   ALPRAZolam (XANAX) 0.25 MG tablet Take 1 tablet (0.25 mg total) by mouth 2 (two) times daily as needed for anxiety. 60 tablet 0   labetalol (NORMODYNE) 100 MG tablet Take 1 tablet (100 mg total) by mouth 2 (two) times daily. 60 tablet 1   prochlorperazine (COMPAZINE) 10 MG tablet Take 1 tablet (10 mg total) by mouth every 6 (six) hours as needed for nausea or vomiting. 60 tablet  2   bacitracin ointment Apply 1 application topically 2 (two) times daily. (Patient not taking: Reported on 12/29/2020) 120 g 0   diphenhydrAMINE-zinc acetate (BENADRYL EXTRA STRENGTH) cream Apply 1 application topically 3 (three) times daily as needed for itching. (Patient not taking: Reported on 12/29/2020) 28.4 g 0   No current facility-administered medications for this visit.    OBJECTIVE: Vitals:   12/29/20 1258  BP: (!) 180/124  Pulse: 90  Resp: 20   Temp: 97.8 F (36.6 C)  SpO2: 100%     Body mass index is 21.66 kg/m.    ECOG FS:0 - Asymptomatic  General: Well-developed, well-nourished, no acute distress. Eyes: Pink conjunctiva, anicteric sclera. HEENT: Normocephalic, moist mucous membranes. Breast: Exam deferred today. Lungs: No audible wheezing or coughing. Heart: Regular rate and rhythm. Abdomen: Soft, nontender, no obvious distention. Musculoskeletal: No edema, cyanosis, or clubbing. Neuro: Alert, answering all questions appropriately. Cranial nerves grossly intact. Skin: No rashes or petechiae noted. Psych: Normal affect.   LAB RESULTS:  Lab Results  Component Value Date   NA 134 (L) 05/22/2020   K 3.3 (L) 05/22/2020   CL 98 05/22/2020   CO2 24 05/22/2020   GLUCOSE 110 (H) 05/22/2020   BUN 5 (L) 05/22/2020   CREATININE 0.65 05/22/2020   CALCIUM 9.3 05/22/2020   PROT 8.6 (H) 05/22/2020   ALBUMIN 4.2 05/22/2020   AST 96 (H) 05/22/2020   ALT 95 (H) 05/22/2020   ALKPHOS 72 05/22/2020   BILITOT 0.4 05/22/2020   GFRNONAA >60 05/22/2020   GFRAA >60 12/01/2017    Lab Results  Component Value Date   WBC 5.5 05/22/2020   NEUTROABS 2.2 05/22/2020   HGB 12.4 05/22/2020   HCT 40.1 05/22/2020   MCV 83.2 05/22/2020   PLT 250 05/22/2020     STUDIES: US BREAST LTD UNI LEFT INC AXILLA  Result Date: 12/15/2020 CLINICAL DATA:  Palpable abnormality in the LEFT breast for proximally 1 year. History of LEFT breast abscess. EXAM: DIGITAL DIAGNOSTIC BILATERAL MAMMOGRAM WITH TOMOSYNTHESIS AND CAD; ULTRASOUND LEFT BREAST LIMITED; ULTRASOUND RIGHT BREAST LIMITED TECHNIQUE: Bilateral digital diagnostic mammography and breast tomosynthesis was performed. The images were evaluated with computer-aided detection.; Targeted ultrasound examination of the left breast was performed.; Targeted ultrasound examination of the right breast was performed COMPARISON:  None. ACR Breast Density Category c: The breast tissue is heterogeneously  dense, which may obscure small masses. FINDINGS: RIGHT BREAST: Mammogram: Within the LATERAL portion of the RIGHT breast there is a partially obscured oval mass further evaluated with ultrasound. Spot tangential view of palpable mass in the UPPER RIGHT axilla shows superficial circumscribed mass. Mammographic images were processed with CAD. Physical Exam: There is a visible, protuberant mass in the UPPER RIGHT axilla, superficial to the humeral head. The patient is able to express a small amount of fluid from numerous sites on the dome of the mass. She has a history of being able to decompress the mass by expressing fluid over the last 5 years. I palpate soft nodularity without discrete mass in the LATERAL aspect of the RIGHT breast. Ultrasound: Targeted ultrasound is performed, showing a large circumscribed oval parallel mass with posterior acoustic enhancement corresponding to the area of patient's concern in the UPPER RIGHT axilla. Mass measures 2.6 x 3.0 x 1.2 centimeters. Internal blood flow confirmed on Doppler evaluation. Mass is superficial. In the LOWER axilla, lymph nodes with normal morphology are imaged. In the 9 o'clock location 8 centimeters from the nipple, there is an irregular taller  than wide mass measuring 0.9 x 0.7 x 0.7 centimeters. Mass is vascular on Doppler evaluation. LEFT BREAST: Mammogram: There is a large, partially obscured mass in the LATERAL aspect of the LEFT breast corresponding to the area of patient's concern. In the LATERAL periareolar region of the LEFT breast, there is a circumscribed mass associated with distortion of the nipple. Enlarged LEFT axillary lymph nodes are also identified. Mammographic images were processed with CAD. Physical Exam: I palpate a large mass in the 2-3 o'clock location of the LEFT breast corresponding to the area of concern. I palpate an oval mobile mass just LATERAL to the LEFT nipple and distorting the nipple areolar complex. I palpate 2 discrete firm  nodules in the LOWER LEFT axilla. Ultrasound: Targeted ultrasound is performed, showing an oval mass with indistinct and angular margins in the 3 o'clock location of the LEFT breast 5 centimeters from the nipple. Mass measures 5.1 x 2.6 x 4.6 centimeters and contains internal blood flow by Doppler evaluation. In the 1 o'clock location 3 centimeters from the nipple, a small satellite lesion is seen adjacent to this larger lesion. This mass contains internal blood flow and measures 0.9 x 0.8 x 1.0 centimeters. In the retroareolar region of the RIGHT breast, large circumscribed oval mass is 3.5 x 1.8 x 3 1 centimeter. Internal blood flow is confirmed on Doppler evaluation. In the LEFT axilla, there are at least 4 enlarged LEFT axillary lymph nodes. The two LOWER lymph nodes are palpable and have cortical thickening of up to 6 millimeters. IMPRESSION: 1. RIGHT breast: Palpable RIGHT axillary mass is consistent with benign intradermal inclusion cyst. Suspicious mass in the 9 o'clock location of the RIGHT breast for which biopsy is recommended. No RIGHT axillary adenopathy. 2. LEFT breast: Suspicious mass in the 3 o'clock location of the LEFT breast for which biopsy is recommended. Small satellite lesion adjacent to this mass is also suspicious. Circumscribed mass deforming the LEFT nipple areolar complex may be benign but warrants biopsy. Four enlarged LEFT axillary lymph nodes. RECOMMENDATION: 1. RIGHT breast: Recommend ultrasound-guided core biopsy of mass in the 9 o'clock location the RIGHT breast. Recommend clinical follow-up of RIGHT axillary mass, likely representing intradermal inclusion cyst. 2. LEFT breast: Recommend ultrasound-guided core biopsy of mass in the 3 o'clock location of the LEFT breast. Recommend ultrasound-guided core biopsy of mass in the LATERAL retroareolar region of the LEFT breast. Recommend ultrasound-guided core biopsy of 1 of the LOWER LEFT axillary lymph nodes. 3. Plan is to have patient  return on 2 separate days for the 4 biopsies needed. I have discussed the findings and recommendations with the patient. If applicable, a reminder letter will be sent to the patient regarding the next appointment. BI-RADS CATEGORY  5: Highly suggestive of malignancy. Electronically Signed   By: Nolon Nations M.D.   On: 12/15/2020 11:32  US BREAST LTD UNI RIGHT INC AXILLA  Result Date: 12/15/2020 CLINICAL DATA:  Palpable abnormality in the LEFT breast for proximally 1 year. History of LEFT breast abscess. EXAM: DIGITAL DIAGNOSTIC BILATERAL MAMMOGRAM WITH TOMOSYNTHESIS AND CAD; ULTRASOUND LEFT BREAST LIMITED; ULTRASOUND RIGHT BREAST LIMITED TECHNIQUE: Bilateral digital diagnostic mammography and breast tomosynthesis was performed. The images were evaluated with computer-aided detection.; Targeted ultrasound examination of the left breast was performed.; Targeted ultrasound examination of the right breast was performed COMPARISON:  None. ACR Breast Density Category c: The breast tissue is heterogeneously dense, which may obscure small masses. FINDINGS: RIGHT BREAST: Mammogram: Within the LATERAL portion of the RIGHT  breast there is a partially obscured oval mass further evaluated with ultrasound. Spot tangential view of palpable mass in the UPPER RIGHT axilla shows superficial circumscribed mass. Mammographic images were processed with CAD. Physical Exam: There is a visible, protuberant mass in the UPPER RIGHT axilla, superficial to the humeral head. The patient is able to express a small amount of fluid from numerous sites on the dome of the mass. She has a history of being able to decompress the mass by expressing fluid over the last 5 years. I palpate soft nodularity without discrete mass in the LATERAL aspect of the RIGHT breast. Ultrasound: Targeted ultrasound is performed, showing a large circumscribed oval parallel mass with posterior acoustic enhancement corresponding to the area of patient's concern in  the UPPER RIGHT axilla. Mass measures 2.6 x 3.0 x 1.2 centimeters. Internal blood flow confirmed on Doppler evaluation. Mass is superficial. In the LOWER axilla, lymph nodes with normal morphology are imaged. In the 9 o'clock location 8 centimeters from the nipple, there is an irregular taller than wide mass measuring 0.9 x 0.7 x 0.7 centimeters. Mass is vascular on Doppler evaluation. LEFT BREAST: Mammogram: There is a large, partially obscured mass in the LATERAL aspect of the LEFT breast corresponding to the area of patient's concern. In the LATERAL periareolar region of the LEFT breast, there is a circumscribed mass associated with distortion of the nipple. Enlarged LEFT axillary lymph nodes are also identified. Mammographic images were processed with CAD. Physical Exam: I palpate a large mass in the 2-3 o'clock location of the LEFT breast corresponding to the area of concern. I palpate an oval mobile mass just LATERAL to the LEFT nipple and distorting the nipple areolar complex. I palpate 2 discrete firm nodules in the LOWER LEFT axilla. Ultrasound: Targeted ultrasound is performed, showing an oval mass with indistinct and angular margins in the 3 o'clock location of the LEFT breast 5 centimeters from the nipple. Mass measures 5.1 x 2.6 x 4.6 centimeters and contains internal blood flow by Doppler evaluation. In the 1 o'clock location 3 centimeters from the nipple, a small satellite lesion is seen adjacent to this larger lesion. This mass contains internal blood flow and measures 0.9 x 0.8 x 1.0 centimeters. In the retroareolar region of the RIGHT breast, large circumscribed oval mass is 3.5 x 1.8 x 3 1 centimeter. Internal blood flow is confirmed on Doppler evaluation. In the LEFT axilla, there are at least 4 enlarged LEFT axillary lymph nodes. The two LOWER lymph nodes are palpable and have cortical thickening of up to 6 millimeters. IMPRESSION: 1. RIGHT breast: Palpable RIGHT axillary mass is consistent with  benign intradermal inclusion cyst. Suspicious mass in the 9 o'clock location of the RIGHT breast for which biopsy is recommended. No RIGHT axillary adenopathy. 2. LEFT breast: Suspicious mass in the 3 o'clock location of the LEFT breast for which biopsy is recommended. Small satellite lesion adjacent to this mass is also suspicious. Circumscribed mass deforming the LEFT nipple areolar complex may be benign but warrants biopsy. Four enlarged LEFT axillary lymph nodes. RECOMMENDATION: 1. RIGHT breast: Recommend ultrasound-guided core biopsy of mass in the 9 o'clock location the RIGHT breast. Recommend clinical follow-up of RIGHT axillary mass, likely representing intradermal inclusion cyst. 2. LEFT breast: Recommend ultrasound-guided core biopsy of mass in the 3 o'clock location of the LEFT breast. Recommend ultrasound-guided core biopsy of mass in the LATERAL retroareolar region of the LEFT breast. Recommend ultrasound-guided core biopsy of 1 of the LOWER LEFT axillary  lymph nodes. 3. Plan is to have patient return on 2 separate days for the 4 biopsies needed. I have discussed the findings and recommendations with the patient. If applicable, a reminder letter will be sent to the patient regarding the next appointment. BI-RADS CATEGORY  5: Highly suggestive of malignancy. Electronically Signed   By: Nolon Nations M.D.   On: 12/15/2020 11:32  [place] AXILLARY NODE CORE BIOPSY LEFT  Addendum Date: 12/29/2020   ADDENDUM REPORT: 12/29/2020 09:32 ADDENDUM: PATHOLOGY revealed: Site A. BREAST, LEFT RETROAREOLAR 3:00; ULTRASOUND-GUIDED BIOPSY: - PREDOMINANTLY KERATINACEOUS DEBRIS. - SINGLE BIOPSY FRAGMENT OF UNREMARKABLE SKIN. - NEGATIVE FOR ATYPIA AND MALIGNANCY. Comment: The histologic findings in part A (left retroareolar biopsy at 3:00) may represent a benign cutaneous cyst (e.g. inclusion cyst). Correlation with clinical and radiographic findings is required. Pathology results are CONCORDANT with imaging findings, per  Dr. Lovey Newcomer. PATHOLOGY revealed: Site B. BREAST, LEFT 3:00 5 CM FN; ULTRASOUND-GUIDED BIOPSY: - INVASIVE MAMMARY CARCINOMA, NO SPECIAL TYPE, WITH PLEOMORPHIC LOBULAR FEATURES. Size of invasive carcinoma: 11 mm in this sample. Grade 3. Ductal carcinoma in situ: Not identified. Pathology results are CONCORDANT with imaging findings, per Dr. Lovey Newcomer. PATHOLOGY revealed: Site C. LYMPH NODE, LEFT AXILLA; ULTRASOUND-GUIDED BIOPSY: - METASTATIC CARCINOMA MORPHOLOGICALLY CONSISTENT WITH CARCINOMA SAMPLED IN PART B INVOLVING A LYMPH NODE. Pathology results are CONCORDANT with imaging findings, per Dr. Lovey Newcomer. PATHOLOGY revealed: Site D. BREAST, RIGHT 9:00 8 CM FN; ULTRASOUND-GUIDED BIOPSY: - INVASIVE MAMMARY CARCINOMA, NO SPECIAL TYPE, WITH LOBULAR FEATURES. Size of invasive carcinoma: 5 mm in this sample. Grade 2. Ductal carcinoma in situ: Not identified. Lymphovascular invasion: Not identified. Comment: The carcinomas from the left (part B) and right (part D) breast are morphologically dissimilar. The definitive grade will be assigned on the excisional specimens. Pathology results are CONCORDANT with imaging findings, per Dr. Lovey Newcomer. Pathology results and recommendations below were discussed with patient and husband Barbaraann Rondo) via telephone on 12/25/2020. Patient reported biopsy site within normal limits with slight tenderness at the site. Post biopsy care instructions were reviewed, questions were answered and my direct phone number was provided to patient. Patient was instructed to call [facility] if any concerns or questions arise related to the biopsy. RECOMMENDATIONS: 1. Surgical consultation. Request for surgical consultation relayed to Al Pimple RN at [facility] by Electa Sniff RN on 12/25/2020. 2. Consider bilateral breast MRI given patient's heterogeneously dense breast tissue, which may obscure small masses. Pathology results reported by Electa Sniff RN on 12/25/2020.  Electronically Signed   By: Lovey Newcomer M.D.   On: 12/29/2020 09:32   Result Date: 12/29/2020 CLINICAL DATA:  Patient with indeterminate left breast masses and left axillary lymph node. EXAM: ULTRASOUND GUIDED LEFT BREAST CORE NEEDLE BIOPSY COMPARISON:  Previous exam(s). PROCEDURE: I met with the patient and we discussed the procedure of ultrasound-guided biopsy, including benefits and alternatives. We discussed the high likelihood of a successful procedure. We discussed the risks of the procedure, including infection, bleeding, tissue injury, clip migration, and inadequate sampling. Informed written consent was given. The usual time-out protocol was performed immediately prior to the procedure. Site 1: Left breast mass 3 o'clock position retroareolar location Lesion quadrant: Upper outer quadrant Using sterile technique and 1% Lidocaine as local anesthetic, under direct ultrasound visualization, a 14 gauge spring-loaded device was used to perform biopsy of left breast mass using a lateral approach. At the conclusion of the procedure ribbon shaped tissue marker clip was deployed into the biopsy  cavity. Follow up 2 view mammogram was performed and dictated separately. Site 2: Left breast mass 3 o'clock position 5 cm from the nipple. Lesion quadrant: Upper outer quadrant Using sterile technique and 1% Lidocaine as local anesthetic, under direct ultrasound visualization, a 14 gauge spring-loaded device was used to perform biopsy of left breast mass 3 o'clock position 5 cm from the nipple using a lateral approach. At the conclusion of the procedure heart shaped tissue marker clip was deployed into the biopsy cavity. Follow up 2 view mammogram was performed and dictated separately. Site 3: Left axillary lymph node Lesion quadrant: Upper outer quadrant Using sterile technique and 1% Lidocaine as local anesthetic, under direct ultrasound visualization, a 14 gauge spring-loaded device was used to perform biopsy of left  axillary lymph node using a lateral approach. At the conclusion of the procedure [facility] tissue marker clip was deployed into the biopsy cavity. Follow up 2 view mammogram was performed and dictated separately. IMPRESSION: Ultrasound guided biopsy of left breast masses and left axillary lymph node. No apparent complications. Electronically Signed: By: Lovey Newcomer M.D. On: 12/24/2020 10:04   MS DIGITAL DIAG TOMO BILAT  Result Date: 12/15/2020 CLINICAL DATA:  Palpable abnormality in the LEFT breast for proximally 1 year. History of LEFT breast abscess. EXAM: DIGITAL DIAGNOSTIC BILATERAL MAMMOGRAM WITH TOMOSYNTHESIS AND CAD; ULTRASOUND LEFT BREAST LIMITED; ULTRASOUND RIGHT BREAST LIMITED TECHNIQUE: Bilateral digital diagnostic mammography and breast tomosynthesis was performed. The images were evaluated with computer-aided detection.; Targeted ultrasound examination of the left breast was performed.; Targeted ultrasound examination of the right breast was performed COMPARISON:  None. ACR Breast Density Category c: The breast tissue is heterogeneously dense, which may obscure small masses. FINDINGS: RIGHT BREAST: Mammogram: Within the LATERAL portion of the RIGHT breast there is a partially obscured oval mass further evaluated with ultrasound. Spot tangential view of palpable mass in the UPPER RIGHT axilla shows superficial circumscribed mass. Mammographic images were processed with CAD. Physical Exam: There is a visible, protuberant mass in the UPPER RIGHT axilla, superficial to the humeral head. The patient is able to express a small amount of fluid from numerous sites on the dome of the mass. She has a history of being able to decompress the mass by expressing fluid over the last 5 years. I palpate soft nodularity without discrete mass in the LATERAL aspect of the RIGHT breast. Ultrasound: Targeted ultrasound is performed, showing a large circumscribed oval parallel mass with posterior acoustic enhancement  corresponding to the area of patient's concern in the UPPER RIGHT axilla. Mass measures 2.6 x 3.0 x 1.2 centimeters. Internal blood flow confirmed on Doppler evaluation. Mass is superficial. In the LOWER axilla, lymph nodes with normal morphology are imaged. In the 9 o'clock location 8 centimeters from the nipple, there is an irregular taller than wide mass measuring 0.9 x 0.7 x 0.7 centimeters. Mass is vascular on Doppler evaluation. LEFT BREAST: Mammogram: There is a large, partially obscured mass in the LATERAL aspect of the LEFT breast corresponding to the area of patient's concern. In the LATERAL periareolar region of the LEFT breast, there is a circumscribed mass associated with distortion of the nipple. Enlarged LEFT axillary lymph nodes are also identified. Mammographic images were processed with CAD. Physical Exam: I palpate a large mass in the 2-3 o'clock location of the LEFT breast corresponding to the area of concern. I palpate an oval mobile mass just LATERAL to the LEFT nipple and distorting the nipple areolar complex. I palpate 2 discrete firm  nodules in the LOWER LEFT axilla. Ultrasound: Targeted ultrasound is performed, showing an oval mass with indistinct and angular margins in the 3 o'clock location of the LEFT breast 5 centimeters from the nipple. Mass measures 5.1 x 2.6 x 4.6 centimeters and contains internal blood flow by Doppler evaluation. In the 1 o'clock location 3 centimeters from the nipple, a small satellite lesion is seen adjacent to this larger lesion. This mass contains internal blood flow and measures 0.9 x 0.8 x 1.0 centimeters. In the retroareolar region of the RIGHT breast, large circumscribed oval mass is 3.5 x 1.8 x 3 1 centimeter. Internal blood flow is confirmed on Doppler evaluation. In the LEFT axilla, there are at least 4 enlarged LEFT axillary lymph nodes. The two LOWER lymph nodes are palpable and have cortical thickening of up to 6 millimeters. IMPRESSION: 1. RIGHT  breast: Palpable RIGHT axillary mass is consistent with benign intradermal inclusion cyst. Suspicious mass in the 9 o'clock location of the RIGHT breast for which biopsy is recommended. No RIGHT axillary adenopathy. 2. LEFT breast: Suspicious mass in the 3 o'clock location of the LEFT breast for which biopsy is recommended. Small satellite lesion adjacent to this mass is also suspicious. Circumscribed mass deforming the LEFT nipple areolar complex may be benign but warrants biopsy. Four enlarged LEFT axillary lymph nodes. RECOMMENDATION: 1. RIGHT breast: Recommend ultrasound-guided core biopsy of mass in the 9 o'clock location the RIGHT breast. Recommend clinical follow-up of RIGHT axillary mass, likely representing intradermal inclusion cyst. 2. LEFT breast: Recommend ultrasound-guided core biopsy of mass in the 3 o'clock location of the LEFT breast. Recommend ultrasound-guided core biopsy of mass in the LATERAL retroareolar region of the LEFT breast. Recommend ultrasound-guided core biopsy of 1 of the LOWER LEFT axillary lymph nodes. 3. Plan is to have patient return on 2 separate days for the 4 biopsies needed. I have discussed the findings and recommendations with the patient. If applicable, a reminder letter will be sent to the patient regarding the next appointment. BI-RADS CATEGORY  5: Highly suggestive of malignancy. Electronically Signed   By: Nolon Nations M.D.   On: 12/15/2020 11:32  MS CLIP PLACEMENT LEFT  Result Date: 12/24/2020 CLINICAL DATA:  Patient status post ultrasound-guided core needle biopsy left breast masses and left axillary lymph node. EXAM: DIAGNOSTIC LEFT MAMMOGRAM POST ULTRASOUND BIOPSY COMPARISON:  Previous exam(s). FINDINGS: Mammographic images were obtained following ultrasound guided biopsy of left breast masses and left axillary lymph node. Site 1: Left breast mass 3 o'clock position retroareolar location: Ribbon clip: In appropriate position. Site 2: Left breast mass 3  o'clock position 5 cm from the nipple: Heart shaped clip: In appropriate position. Site 3: Left axillary lymph node: HydroMARK clip: In appropriate position. IMPRESSION: Appropriate positioning of the biopsy marking clips as above. Final Assessment: Post Procedure Mammograms for Marker Placement Electronically Signed   By: Lovey Newcomer M.D.   On: 12/24/2020 10:06  MS CLIP PLACEMENT RIGHT  Result Date: 12/24/2020 CLINICAL DATA:  Patient status post ultrasound-guided biopsy right breast mass. EXAM: DIAGNOSTIC RIGHT MAMMOGRAM POST ULTRASOUND BIOPSY COMPARISON:  Previous exam(s). FINDINGS: Mammographic images were obtained following ultrasound guided biopsy of right breast mass. The biopsy marking clip is in expected position at the site of biopsy. IMPRESSION: Appropriate positioning of the venous shaped biopsy marking clip at the site of biopsy in the right breast mass. Final Assessment: Post Procedure Mammograms for Marker Placement Electronically Signed   By: Lovey Newcomer M.D.   On: 12/24/2020 10:05  [place] LT BREAST BX W LOC DEV 1ST LESION IMG BX SPEC US GUIDE  Addendum Date: 12/29/2020   ADDENDUM REPORT: 12/29/2020 09:32 ADDENDUM: PATHOLOGY revealed: Site A. BREAST, LEFT RETROAREOLAR 3:00; ULTRASOUND-GUIDED BIOPSY: - PREDOMINANTLY KERATINACEOUS DEBRIS. - SINGLE BIOPSY FRAGMENT OF UNREMARKABLE SKIN. - NEGATIVE FOR ATYPIA AND MALIGNANCY. Comment: The histologic findings in part A (left retroareolar biopsy at 3:00) may represent a benign cutaneous cyst (e.g. inclusion cyst). Correlation with clinical and radiographic findings is required. Pathology results are CONCORDANT with imaging findings, per Dr. Lovey Newcomer. PATHOLOGY revealed: Site B. BREAST, LEFT 3:00 5 CM FN; ULTRASOUND-GUIDED BIOPSY: - INVASIVE MAMMARY CARCINOMA, NO SPECIAL TYPE, WITH PLEOMORPHIC LOBULAR FEATURES. Size of invasive carcinoma: 11 mm in this sample. Grade 3. Ductal carcinoma in situ: Not identified. Pathology results are CONCORDANT with  imaging findings, per Dr. Lovey Newcomer. PATHOLOGY revealed: Site C. LYMPH NODE, LEFT AXILLA; ULTRASOUND-GUIDED BIOPSY: - METASTATIC CARCINOMA MORPHOLOGICALLY CONSISTENT WITH CARCINOMA SAMPLED IN PART B INVOLVING A LYMPH NODE. Pathology results are CONCORDANT with imaging findings, per Dr. Lovey Newcomer. PATHOLOGY revealed: Site D. BREAST, RIGHT 9:00 8 CM FN; ULTRASOUND-GUIDED BIOPSY: - INVASIVE MAMMARY CARCINOMA, NO SPECIAL TYPE, WITH LOBULAR FEATURES. Size of invasive carcinoma: 5 mm in this sample. Grade 2. Ductal carcinoma in situ: Not identified. Lymphovascular invasion: Not identified. Comment: The carcinomas from the left (part B) and right (part D) breast are morphologically dissimilar. The definitive grade will be assigned on the excisional specimens. Pathology results are CONCORDANT with imaging findings, per Dr. Lovey Newcomer. Pathology results and recommendations below were discussed with patient and husband Barbaraann Rondo) via telephone on 12/25/2020. Patient reported biopsy site within normal limits with slight tenderness at the site. Post biopsy care instructions were reviewed, questions were answered and my direct phone number was provided to patient. Patient was instructed to call [facility] if any concerns or questions arise related to the biopsy. RECOMMENDATIONS: 1. Surgical consultation. Request for surgical consultation relayed to Al Pimple RN at [facility] by Electa Sniff RN on 12/25/2020. 2. Consider bilateral breast MRI given patient's heterogeneously dense breast tissue, which may obscure small masses. Pathology results reported by Electa Sniff RN on 12/25/2020. Electronically Signed   By: Lovey Newcomer M.D.   On: 12/29/2020 09:32   Result Date: 12/29/2020 CLINICAL DATA:  Patient with indeterminate left breast masses and left axillary lymph node. EXAM: ULTRASOUND GUIDED LEFT BREAST CORE NEEDLE BIOPSY COMPARISON:  Previous exam(s). PROCEDURE: I met with the patient and we  discussed the procedure of ultrasound-guided biopsy, including benefits and alternatives. We discussed the high likelihood of a successful procedure. We discussed the risks of the procedure, including infection, bleeding, tissue injury, clip migration, and inadequate sampling. Informed written consent was given. The usual time-out protocol was performed immediately prior to the procedure. Site 1: Left breast mass 3 o'clock position retroareolar location Lesion quadrant: Upper outer quadrant Using sterile technique and 1% Lidocaine as local anesthetic, under direct ultrasound visualization, a 14 gauge spring-loaded device was used to perform biopsy of left breast mass using a lateral approach. At the conclusion of the procedure ribbon shaped tissue marker clip was deployed into the biopsy cavity. Follow up 2 view mammogram was performed and dictated separately. Site 2: Left breast mass 3 o'clock position 5 cm from the nipple. Lesion quadrant: Upper outer quadrant Using sterile technique and 1% Lidocaine as local anesthetic, under direct ultrasound visualization, a 14 gauge spring-loaded device was used to perform biopsy of left breast mass 3 o'clock  position 5 cm from the nipple using a lateral approach. At the conclusion of the procedure heart shaped tissue marker clip was deployed into the biopsy cavity. Follow up 2 view mammogram was performed and dictated separately. Site 3: Left axillary lymph node Lesion quadrant: Upper outer quadrant Using sterile technique and 1% Lidocaine as local anesthetic, under direct ultrasound visualization, a 14 gauge spring-loaded device was used to perform biopsy of left axillary lymph node using a lateral approach. At the conclusion of the procedure [facility] tissue marker clip was deployed into the biopsy cavity. Follow up 2 view mammogram was performed and dictated separately. IMPRESSION: Ultrasound guided biopsy of left breast masses and left axillary lymph node. No apparent  complications. Electronically Signed: By: Lovey Newcomer M.D. On: 12/24/2020 10:04   [place] LT BREAST BX W LOC DEV EA ADD LESION IMG BX SPEC US GUIDE  Addendum Date: 12/29/2020   ADDENDUM REPORT: 12/29/2020 09:32 ADDENDUM: PATHOLOGY revealed: Site A. BREAST, LEFT RETROAREOLAR 3:00; ULTRASOUND-GUIDED BIOPSY: - PREDOMINANTLY KERATINACEOUS DEBRIS. - SINGLE BIOPSY FRAGMENT OF UNREMARKABLE SKIN. - NEGATIVE FOR ATYPIA AND MALIGNANCY. Comment: The histologic findings in part A (left retroareolar biopsy at 3:00) may represent a benign cutaneous cyst (e.g. inclusion cyst). Correlation with clinical and radiographic findings is required. Pathology results are CONCORDANT with imaging findings, per Dr. Lovey Newcomer. PATHOLOGY revealed: Site B. BREAST, LEFT 3:00 5 CM FN; ULTRASOUND-GUIDED BIOPSY: - INVASIVE MAMMARY CARCINOMA, NO SPECIAL TYPE, WITH PLEOMORPHIC LOBULAR FEATURES. Size of invasive carcinoma: 11 mm in this sample. Grade 3. Ductal carcinoma in situ: Not identified. Pathology results are CONCORDANT with imaging findings, per Dr. Lovey Newcomer. PATHOLOGY revealed: Site C. LYMPH NODE, LEFT AXILLA; ULTRASOUND-GUIDED BIOPSY: - METASTATIC CARCINOMA MORPHOLOGICALLY CONSISTENT WITH CARCINOMA SAMPLED IN PART B INVOLVING A LYMPH NODE. Pathology results are CONCORDANT with imaging findings, per Dr. Lovey Newcomer. PATHOLOGY revealed: Site D. BREAST, RIGHT 9:00 8 CM FN; ULTRASOUND-GUIDED BIOPSY: - INVASIVE MAMMARY CARCINOMA, NO SPECIAL TYPE, WITH LOBULAR FEATURES. Size of invasive carcinoma: 5 mm in this sample. Grade 2. Ductal carcinoma in situ: Not identified. Lymphovascular invasion: Not identified. Comment: The carcinomas from the left (part B) and right (part D) breast are morphologically dissimilar. The definitive grade will be assigned on the excisional specimens. Pathology results are CONCORDANT with imaging findings, per Dr. Lovey Newcomer. Pathology results and recommendations below were discussed with patient and husband Barbaraann Rondo) via  telephone on 12/25/2020. Patient reported biopsy site within normal limits with slight tenderness at the site. Post biopsy care instructions were reviewed, questions were answered and my direct phone number was provided to patient. Patient was instructed to call [facility] if any concerns or questions arise related to the biopsy. RECOMMENDATIONS: 1. Surgical consultation. Request for surgical consultation relayed to Al Pimple RN at [facility] by Electa Sniff RN on 12/25/2020. 2. Consider bilateral breast MRI given patient's heterogeneously dense breast tissue, which may obscure small masses. Pathology results reported by Electa Sniff RN on 12/25/2020. Electronically Signed   By: Lovey Newcomer M.D.   On: 12/29/2020 09:32   Result Date: 12/29/2020 CLINICAL DATA:  Patient with indeterminate left breast masses and left axillary lymph node. EXAM: ULTRASOUND GUIDED LEFT BREAST CORE NEEDLE BIOPSY COMPARISON:  Previous exam(s). PROCEDURE: I met with the patient and we discussed the procedure of ultrasound-guided biopsy, including benefits and alternatives. We discussed the high likelihood of a successful procedure. We discussed the risks of the procedure, including infection, bleeding, tissue injury, clip migration, and inadequate sampling. Informed  written consent was given. The usual time-out protocol was performed immediately prior to the procedure. Site 1: Left breast mass 3 o'clock position retroareolar location Lesion quadrant: Upper outer quadrant Using sterile technique and 1% Lidocaine as local anesthetic, under direct ultrasound visualization, a 14 gauge spring-loaded device was used to perform biopsy of left breast mass using a lateral approach. At the conclusion of the procedure ribbon shaped tissue marker clip was deployed into the biopsy cavity. Follow up 2 view mammogram was performed and dictated separately. Site 2: Left breast mass 3 o'clock position 5 cm from the nipple. Lesion  quadrant: Upper outer quadrant Using sterile technique and 1% Lidocaine as local anesthetic, under direct ultrasound visualization, a 14 gauge spring-loaded device was used to perform biopsy of left breast mass 3 o'clock position 5 cm from the nipple using a lateral approach. At the conclusion of the procedure heart shaped tissue marker clip was deployed into the biopsy cavity. Follow up 2 view mammogram was performed and dictated separately. Site 3: Left axillary lymph node Lesion quadrant: Upper outer quadrant Using sterile technique and 1% Lidocaine as local anesthetic, under direct ultrasound visualization, a 14 gauge spring-loaded device was used to perform biopsy of left axillary lymph node using a lateral approach. At the conclusion of the procedure [facility] tissue marker clip was deployed into the biopsy cavity. Follow up 2 view mammogram was performed and dictated separately. IMPRESSION: Ultrasound guided biopsy of left breast masses and left axillary lymph node. No apparent complications. Electronically Signed: By: Lovey Newcomer M.D. On: 12/24/2020 10:04   [place] RT BREAST BX W LOC DEV 1ST LESION IMG BX SPEC US GUIDE  Addendum Date: 12/29/2020   ADDENDUM REPORT: 12/29/2020 09:32 ADDENDUM: PATHOLOGY revealed: Site A. BREAST, LEFT RETROAREOLAR 3:00; ULTRASOUND-GUIDED BIOPSY: - PREDOMINANTLY KERATINACEOUS DEBRIS. - SINGLE BIOPSY FRAGMENT OF UNREMARKABLE SKIN. - NEGATIVE FOR ATYPIA AND MALIGNANCY. Comment: The histologic findings in part A (left retroareolar biopsy at 3:00) may represent a benign cutaneous cyst (e.g. inclusion cyst). Correlation with clinical and radiographic findings is required. Pathology results are CONCORDANT with imaging findings, per Dr. Lovey Newcomer. PATHOLOGY revealed: Site B. BREAST, LEFT 3:00 5 CM FN; ULTRASOUND-GUIDED BIOPSY: - INVASIVE MAMMARY CARCINOMA, NO SPECIAL TYPE, WITH PLEOMORPHIC LOBULAR FEATURES. Size of invasive carcinoma: 11 mm in this sample. Grade 3. Ductal carcinoma in  situ: Not identified. Pathology results are CONCORDANT with imaging findings, per Dr. Lovey Newcomer. PATHOLOGY revealed: Site C. LYMPH NODE, LEFT AXILLA; ULTRASOUND-GUIDED BIOPSY: - METASTATIC CARCINOMA MORPHOLOGICALLY CONSISTENT WITH CARCINOMA SAMPLED IN PART B INVOLVING A LYMPH NODE. Pathology results are CONCORDANT with imaging findings, per Dr. Lovey Newcomer. PATHOLOGY revealed: Site D. BREAST, RIGHT 9:00 8 CM FN; ULTRASOUND-GUIDED BIOPSY: - INVASIVE MAMMARY CARCINOMA, NO SPECIAL TYPE, WITH LOBULAR FEATURES. Size of invasive carcinoma: 5 mm in this sample. Grade 2. Ductal carcinoma in situ: Not identified. Lymphovascular invasion: Not identified. Comment: The carcinomas from the left (part B) and right (part D) breast are morphologically dissimilar. The definitive grade will be assigned on the excisional specimens. Pathology results are CONCORDANT with imaging findings, per Dr. Lovey Newcomer. Pathology results and recommendations below were discussed with patient and husband Barbaraann Rondo) via telephone on 12/25/2020. Patient reported biopsy site within normal limits with slight tenderness at the site. Post biopsy care instructions were reviewed, questions were answered and my direct phone number was provided to patient. Patient was instructed to call [facility] if any concerns or questions arise related to the biopsy. RECOMMENDATIONS: 1. Surgical consultation. Request  for surgical consultation relayed to Al Pimple RN at [facility] by Electa Sniff RN on 12/25/2020. 2. Consider bilateral breast MRI given patient's heterogeneously dense breast tissue, which may obscure small masses. Pathology results reported by Electa Sniff RN on 12/25/2020. Electronically Signed   By: Lovey Newcomer M.D.   On: 12/29/2020 09:32   Result Date: 12/29/2020 CLINICAL DATA:  Patient with suspicious right breast mass. EXAM: ULTRASOUND GUIDED RIGHT BREAST CORE NEEDLE BIOPSY COMPARISON:  Previous exam(s). PROCEDURE: I met with  the patient and we discussed the procedure of ultrasound-guided biopsy, including benefits and alternatives. We discussed the high likelihood of a successful procedure. We discussed the risks of the procedure, including infection, bleeding, tissue injury, clip migration, and inadequate sampling. Informed written consent was given. The usual time-out protocol was performed immediately prior to the procedure. Lesion quadrant: Upper outer quadrant Using sterile technique and 1% Lidocaine as local anesthetic, under direct ultrasound visualization, a 14 gauge spring-loaded device was used to perform biopsy of right breast mass 9 o'clock position using a lateral approach. At the conclusion of the procedure venous tissue marker clip was deployed into the biopsy cavity. Follow up 2 view mammogram was performed and dictated separately. IMPRESSION: Ultrasound guided biopsy of right breast mass 9 o'clock position. No apparent complications. Electronically Signed: By: Lovey Newcomer M.D. On: 12/24/2020 10:04    ASSESSMENT: Bilateral breast cancer.  PLAN:    1.  Bilateral breast cancer: Unclear if this is 2 distinct primaries, or metastatic disease.  ER/PR/HER2 are pending at time of dictation of both biopsies.  Plan to get a CT scan of the chest, abdomen, pelvis in the next week for full metastatic work-up.  We will also get a MUGA scan in preparation for likely the need for chemotherapy.  Patient had surgical consultation this morning who also recommended upfront systemic therapy.  She will require port placement in the near future.  Given patient's age, she will also require genetic testing.  Return to clinic in 1 week to discuss her final pathology and imaging results as well as treatment planning. 2.  Nausea: Patient was given a prescription for Compazine. 3.  Anxiety/insomnia: Patient was given a prescription for Xanax today.  I spent a total of 60 minutes reviewing chart data, face-to-face evaluation with the  patient, counseling and coordination of care as detailed above.   Patient expressed understanding and was in agreement with this plan. She also understands that She can call clinic at any time with any questions, concerns, or complaints.   Cancer Staging No matching staging information was found for the patient.  Lloyd Huger, MD   12/29/2020 3:35 PM

## 2020-12-29 NOTE — Progress Notes (Signed)
Patient notified of biopsy results. T  Completed [employer] Medicaid application today.  Referred tp Dr. Grayland Ormond, and Dr.  Christian Mate.   Risk Assessment   No risk assessment data for the current encounter  Risk Scores       12/15/2020   Last edited by: Terri Skains S, CMA   5-year risk: 0.9 %   Lifetime risk: 9.2 %

## 2020-12-29 NOTE — Patient Instructions (Signed)
Please keep your appointment with Dr.Finnegan today.

## 2020-12-30 ENCOUNTER — Ambulatory Visit
Admission: RE | Admit: 2020-12-30 | Discharge: 2020-12-30 | Disposition: A | Discharge status: Home / Self Care | Payer: Medicaid Other | Source: Ambulatory Visit | Attending: Oncology | Admitting: Oncology

## 2020-12-30 ENCOUNTER — Telehealth: Payer: Self-pay | Admitting: Surgery

## 2020-12-30 DIAGNOSIS — C50919 Malignant neoplasm of unspecified site of unspecified female breast: Secondary | ICD-10-CM | POA: Insufficient documentation

## 2020-12-30 LAB — POCT I-STAT CREATININE: Creatinine, Ser: 0.8 mg/dL (ref 0.44–1.00)

## 2020-12-30 MED ORDER — IOHEXOL 300 MG/ML  SOLN
100.0000 mL | Freq: Once | INTRAMUSCULAR | Status: AC | PRN
  Administered 2020-12-30: 100 mL via INTRAVENOUS

## 2020-12-30 NOTE — Telephone Encounter (Signed)
Pt has been advised of Pre-Admission date/time, COVID Testing date and Surgery date.  Surgery Date: 01/01/21 Preadmission Testing Date: 12/31/20 (phone 8a-1p) Covid Testing Date: Not needed.     Patient has been made aware to call [phone], between 1-3:00pm the day before surgery, to find out what time to arrive for surgery.

## 2020-12-31 ENCOUNTER — Encounter
Admission: RE | Admit: 2020-12-31 | Discharge: 2020-12-31 | Disposition: A | Discharge status: Home / Self Care | Payer: Self-pay | Source: Ambulatory Visit | Attending: Surgery | Admitting: Surgery

## 2020-12-31 ENCOUNTER — Other Ambulatory Visit: Payer: Self-pay

## 2020-12-31 HISTORY — DX: Anxiety disorder, unspecified: F41.9

## 2020-12-31 LAB — SURGICAL PATHOLOGY

## 2020-12-31 MED ORDER — FAMOTIDINE 20 MG PO TABS: 20.0000 mg | ORAL_TABLET | Freq: Once | ORAL | Status: AC

## 2020-12-31 MED ORDER — ACETAMINOPHEN 500 MG PO TABS: 1000.0000 mg | ORAL_TABLET | ORAL | Status: AC

## 2020-12-31 MED ORDER — ORAL CARE MOUTH RINSE: 15.0000 mL | Freq: Once | OROMUCOSAL | Status: AC

## 2020-12-31 MED ORDER — CHLORHEXIDINE GLUCONATE 0.12 % MT SOLN: 15.0000 mL | Freq: Once | OROMUCOSAL | Status: AC

## 2020-12-31 MED ORDER — CHLORHEXIDINE GLUCONATE CLOTH 2 % EX PADS: 6.0000 | MEDICATED_PAD | Freq: Once | CUTANEOUS | Status: DC

## 2020-12-31 MED ORDER — CEFAZOLIN SODIUM-DEXTROSE 2-4 GM/100ML-% IV SOLN
2.0000 g | INTRAVENOUS | Status: AC
  Administered 2021-01-01: 2 g via INTRAVENOUS

## 2020-12-31 MED ORDER — GABAPENTIN 300 MG PO CAPS: 300.0000 mg | ORAL_CAPSULE | ORAL | Status: AC

## 2020-12-31 MED ORDER — LACTATED RINGERS IV SOLN: INTRAVENOUS | Status: DC

## 2020-12-31 MED ORDER — CELECOXIB 200 MG PO CAPS: 200.0000 mg | ORAL_CAPSULE | ORAL | Status: AC

## 2020-12-31 NOTE — Patient Instructions (Signed)
Your procedure is scheduled on: 01/01/21 Report to [facility]. To find out your arrival time please call [phone] between 1PM - 3PM on 12/31/20.  Remember: Instructions that are not followed completely may result in serious medical risk, up to and including death, or upon the discretion of your surgeon and anesthesiologist your surgery may need to be rescheduled.     _X__ 1. Do not eat food after midnight the night before your procedure.                 No gum chewing or hard candies. You may drink clear liquids up to 2 hours                 before you are scheduled to arrive for your surgery- DO not drink clear                 liquids within 2 hours of the start of your surgery.                 Clear Liquids include:  water, apple juice without pulp, clear carbohydrate                 drink such as Clearfast or Gatorade, Black Coffee or Tea (Do not add                 anything to coffee or tea). Diabetics water only  __X__2.  On the morning of surgery brush your teeth with toothpaste and water, you                 may rinse your mouth with mouthwash if you wish.  Do not swallow any              toothpaste of mouthwash.     _X__ 3.  No Alcohol for 24 hours before or after surgery.   _X__ 4.  Do Not Smoke or use e-cigarettes For 24 Hours Prior to Your Surgery.                 Do not use any chewable tobacco products for at least 6 hours prior to                 surgery.  ____  5.  Bring all medications with you on the day of surgery if instructed.   __X__  6.  Notify your doctor if there is any change in your medical condition      (cold, fever, infections).     Do not wear jewelry, make-up, hairpins, clips or nail polish. Do not wear lotions, powders, or perfumes. No deodorant Do not shave 48 hours prior to surgery. Men may shave face and neck. Do not bring valuables to the hospital.    [facility] is not responsible for any  belongings or valuables.  Contacts, dentures/partials or body piercings may not be worn into surgery. Bring a case for your contacts, glasses or hearing aids, a denture cup will be supplied. Leave your suitcase in the car. After surgery it may be brought to your room. For patients admitted to the hospital, discharge time is determined by your treatment team.   Patients discharged the day of surgery will not be allowed to drive home.   Please read over the following fact sheets that you were given:     __X__ Take these medicines the morning of surgery with A SIP OF WATER:  1. labetalol (NORMODYNE) 100 MG tablet  2. ALPRAZolam (XANAX) 0.25 MG tablet if needed  3.   4.  5.  6.  ____ Fleet Enema (as directed)   ____ Use CHG Soap/SAGE wipes as directed  ____ Use inhalers on the day of surgery  ____ Stop metformin/Janumet/Farxiga 2 days prior to surgery    ____ Take 1/2 of usual insulin dose the night before surgery. No insulin the morning          of surgery.   ____ Stop Blood Thinners Coumadin/Plavix/Xarelto/Pleta/Pradaxa/Eliquis/Effient/Aspirin  on   Or contact your Surgeon, Cardiologist or Medical Doctor regarding  ability to stop your blood thinners  __X__ Stop Anti-inflammatories 7 days before surgery such as Advil, Ibuprofen, Motrin,  BC or Goodies Powder, Naprosyn, Naproxen, Aleve, Aspirin    __X__ Stop all herbal supplements, fish oil or vitamin E until after surgery.    ____ Bring C-Pap to the hospital.    Shower tonight and tomorrow morning with "orange" Dial soap.

## 2021-01-01 ENCOUNTER — Ambulatory Visit: Payer: Medicaid Other | Admitting: Certified Registered"

## 2021-01-01 ENCOUNTER — Encounter: Payer: Self-pay | Admitting: Surgery

## 2021-01-01 ENCOUNTER — Encounter
Admission: RE | Disposition: A | Discharge status: Home / Self Care | Payer: Self-pay | Source: Ambulatory Visit | Attending: Surgery

## 2021-01-01 ENCOUNTER — Ambulatory Visit: Payer: Medicaid Other

## 2021-01-01 ENCOUNTER — Other Ambulatory Visit: Payer: Self-pay

## 2021-01-01 ENCOUNTER — Ambulatory Visit
Admission: RE | Admit: 2021-01-01 | Discharge: 2021-01-01 | Disposition: A | Discharge status: Home / Self Care | Payer: Medicaid Other | Source: Ambulatory Visit | Attending: Surgery | Admitting: Surgery

## 2021-01-01 DIAGNOSIS — C50411 Malignant neoplasm of upper-outer quadrant of right female breast: Secondary | ICD-10-CM

## 2021-01-01 DIAGNOSIS — C50412 Malignant neoplasm of upper-outer quadrant of left female breast: Secondary | ICD-10-CM

## 2021-01-01 DIAGNOSIS — I10 Essential (primary) hypertension: Secondary | ICD-10-CM | POA: Insufficient documentation

## 2021-01-01 DIAGNOSIS — C50912 Malignant neoplasm of unspecified site of left female breast: Secondary | ICD-10-CM | POA: Diagnosis not present

## 2021-01-01 DIAGNOSIS — F17210 Nicotine dependence, cigarettes, uncomplicated: Secondary | ICD-10-CM | POA: Diagnosis not present

## 2021-01-01 DIAGNOSIS — R590 Localized enlarged lymph nodes: Secondary | ICD-10-CM | POA: Diagnosis not present

## 2021-01-01 DIAGNOSIS — C50911 Malignant neoplasm of unspecified site of right female breast: Secondary | ICD-10-CM | POA: Diagnosis present

## 2021-01-01 DIAGNOSIS — Z789 Other specified health status: Secondary | ICD-10-CM

## 2021-01-01 HISTORY — PX: PORTACATH PLACEMENT: SHX2246

## 2021-01-01 LAB — COMPREHENSIVE METABOLIC PANEL
ALT: 28 U/L (ref 0–44)
AST: 30 U/L (ref 15–41)
Albumin: 3.6 g/dL (ref 3.5–5.0)
Alkaline Phosphatase: 76 U/L (ref 38–126)
Anion gap: 6 (ref 5–15)
BUN: 7 mg/dL (ref 6–20)
CO2: 25 mmol/L (ref 22–32)
Calcium: 8.9 mg/dL (ref 8.9–10.3)
Chloride: 101 mmol/L (ref 98–111)
Creatinine, Ser: 0.73 mg/dL (ref 0.44–1.00)
GFR, Estimated: >60 mL/min (ref >60)
Glucose, Bld: 88 mg/dL (ref 70–99)
Potassium: 3.6 mmol/L (ref 3.5–5.1)
Sodium: 132 mmol/L — ABNORMAL LOW (ref 135–145)
Total Bilirubin: 0.8 mg/dL (ref 0.3–1.2)
Total Protein: 7.4 g/dL (ref 6.5–8.1)

## 2021-01-01 LAB — CBC WITH DIFFERENTIAL/PLATELET
Abs Immature Granulocytes: 0.03 10*3/uL (ref 0.00–0.07)
Basophils Absolute: 0 10*3/uL (ref 0.0–0.1)
Basophils Relative: 0 %
Eosinophils Absolute: 0.1 10*3/uL (ref 0.0–0.5)
Eosinophils Relative: 1 %
HCT: 37.1 % (ref 36.0–46.0)
Hemoglobin: 11.9 g/dL — ABNORMAL LOW (ref 12.0–15.0)
Immature Granulocytes: 1 %
Lymphocytes Relative: 30 %
Lymphs Abs: 1.8 10*3/uL (ref 0.7–4.0)
MCH: 29 pg (ref 26.0–34.0)
MCHC: 32.1 g/dL (ref 30.0–36.0)
MCV: 90.5 fL (ref 80.0–100.0)
Monocytes Absolute: 0.7 10*3/uL (ref 0.1–1.0)
Monocytes Relative: 12 %
Neutro Abs: 3.4 10*3/uL (ref 1.7–7.7)
Neutrophils Relative %: 56 %
Platelets: 220 10*3/uL (ref 150–400)
RBC: 4.1 MIL/uL (ref 3.87–5.11)
RDW: 16.3 % — ABNORMAL HIGH (ref 11.5–15.5)
WBC: 6.1 10*3/uL (ref 4.0–10.5)
nRBC: 0 % (ref 0.0–0.2)

## 2021-01-01 LAB — POCT PREGNANCY, URINE: Preg Test, Ur: NEGATIVE

## 2021-01-01 SURGERY — INSERTION, TUNNELED CENTRAL VENOUS DEVICE, WITH PORT
Anesthesia: General | Laterality: Right

## 2021-01-01 MED ORDER — FENTANYL CITRATE (PF) 100 MCG/2ML IJ SOLN
INTRAMUSCULAR | Status: AC
  Filled 2021-01-01: qty 2

## 2021-01-01 MED ORDER — 0.9 % SODIUM CHLORIDE (POUR BTL) OPTIME
TOPICAL | Status: DC | PRN
  Administered 2021-01-01: 500 mL

## 2021-01-01 MED ORDER — BUPIVACAINE-EPINEPHRINE (PF) 0.25% -1:200000 IJ SOLN
INTRAMUSCULAR | Status: DC | PRN
  Administered 2021-01-01: 9 mL via PERINEURAL

## 2021-01-01 MED ORDER — ACETAMINOPHEN 500 MG PO TABS
ORAL_TABLET | ORAL | Status: AC
  Administered 2021-01-01: 1000 mg via ORAL
  Filled 2021-01-01: qty 2

## 2021-01-01 MED ORDER — SODIUM CHLORIDE 0.9 % IV SOLN
INTRAVENOUS | Status: DC | PRN
  Administered 2021-01-01: 8 mL via INTRAMUSCULAR

## 2021-01-01 MED ORDER — SODIUM CHLORIDE FLUSH 0.9 % IV SOLN
INTRAVENOUS | Status: AC
  Filled 2021-01-01: qty 10

## 2021-01-01 MED ORDER — MIDAZOLAM HCL 5 MG/5ML IJ SOLN
INTRAMUSCULAR | Status: DC | PRN
  Administered 2021-01-01: 2 mg via INTRAVENOUS

## 2021-01-01 MED ORDER — GABAPENTIN 300 MG PO CAPS
ORAL_CAPSULE | ORAL | Status: AC
  Administered 2021-01-01: 300 mg via ORAL
  Filled 2021-01-01: qty 1

## 2021-01-01 MED ORDER — FENTANYL CITRATE (PF) 100 MCG/2ML IJ SOLN
INTRAMUSCULAR | Status: DC | PRN
  Administered 2021-01-01 (×2): 50 ug via INTRAVENOUS

## 2021-01-01 MED ORDER — SODIUM CHLORIDE (PF) 0.9 % IJ SOLN
INTRAMUSCULAR | Status: AC
  Filled 2021-01-01: qty 50

## 2021-01-01 MED ORDER — IBUPROFEN 800 MG PO TABS: 800.0000 mg | ORAL_TABLET | Freq: Three times a day (TID) | ORAL | 0 refills | Status: DC | PRN

## 2021-01-01 MED ORDER — PROPOFOL 500 MG/50ML IV EMUL
INTRAVENOUS | Status: DC | PRN
  Administered 2021-01-01: 175 ug/kg/min via INTRAVENOUS

## 2021-01-01 MED ORDER — ONDANSETRON HCL 4 MG/2ML IJ SOLN: 4.0000 mg | Freq: Once | INTRAMUSCULAR | Status: DC | PRN

## 2021-01-01 MED ORDER — PROPOFOL 10 MG/ML IV BOLUS
INTRAVENOUS | Status: DC | PRN
  Administered 2021-01-01: 50 mg via INTRAVENOUS

## 2021-01-01 MED ORDER — DEXMEDETOMIDINE (PRECEDEX) IN NS 20 MCG/5ML (4 MCG/ML) IV SYRINGE
PREFILLED_SYRINGE | INTRAVENOUS | Status: DC | PRN
  Administered 2021-01-01: 12 ug via INTRAVENOUS

## 2021-01-01 MED ORDER — SODIUM CHLORIDE 0.9 % IV SOLN
INTRAVENOUS | Status: DC | PRN
  Administered 2021-01-01: 5 mL via INTRAMUSCULAR

## 2021-01-01 MED ORDER — LABETALOL HCL 5 MG/ML IV SOLN
INTRAVENOUS | Status: DC | PRN
  Administered 2021-01-01: 10 mg via INTRAVENOUS

## 2021-01-01 MED ORDER — OXYCODONE HCL 5 MG PO TABS: 5.0000 mg | ORAL_TABLET | Freq: Once | ORAL | Status: DC | PRN

## 2021-01-01 MED ORDER — MIDAZOLAM HCL 2 MG/2ML IJ SOLN
INTRAMUSCULAR | Status: AC
  Filled 2021-01-01: qty 2

## 2021-01-01 MED ORDER — FAMOTIDINE 20 MG PO TABS
ORAL_TABLET | ORAL | Status: AC
  Administered 2021-01-01: 20 mg via ORAL
  Filled 2021-01-01: qty 1

## 2021-01-01 MED ORDER — HEPARIN SODIUM (PORCINE) 5000 UNIT/ML IJ SOLN
INTRAMUSCULAR | Status: AC
  Filled 2021-01-01: qty 2

## 2021-01-01 MED ORDER — CEFAZOLIN SODIUM-DEXTROSE 2-4 GM/100ML-% IV SOLN
INTRAVENOUS | Status: AC
  Filled 2021-01-01: qty 100

## 2021-01-01 MED ORDER — CELECOXIB 200 MG PO CAPS
ORAL_CAPSULE | ORAL | Status: AC
  Administered 2021-01-01: 200 mg via ORAL
  Filled 2021-01-01: qty 1

## 2021-01-01 MED ORDER — CHLORHEXIDINE GLUCONATE 0.12 % MT SOLN
OROMUCOSAL | Status: AC
  Administered 2021-01-01: 15 mL via OROMUCOSAL
  Filled 2021-01-01: qty 15

## 2021-01-01 MED ORDER — BUPIVACAINE-EPINEPHRINE (PF) 0.25% -1:200000 IJ SOLN
INTRAMUSCULAR | Status: AC
  Filled 2021-01-01: qty 30

## 2021-01-01 MED ORDER — FENTANYL CITRATE (PF) 100 MCG/2ML IJ SOLN: 25.0000 ug | INTRAMUSCULAR | Status: DC | PRN

## 2021-01-01 MED ORDER — OXYCODONE HCL 5 MG/5ML PO SOLN: 5.0000 mg | Freq: Once | ORAL | Status: DC | PRN

## 2021-01-01 SURGICAL SUPPLY — 32 items
ADH SKN CLS APL DERMABOND .7 (GAUZE/BANDAGES/DRESSINGS) ×1
APL PRP STRL LF DISP 70% ISPRP (MISCELLANEOUS) ×1
BAG DECANTER FOR FLEXI CONT (MISCELLANEOUS) ×2 IMPLANT
BLADE SURG 15 STRL LF DISP TIS (BLADE) ×1 IMPLANT
BLADE SURG 15 STRL SS (BLADE) ×2
BOOT SUTURE AID YELLOW STND (SUTURE) ×2 IMPLANT
CHLORAPREP W/TINT 26 (MISCELLANEOUS) ×2 IMPLANT
COVER LIGHT HANDLE STERIS (MISCELLANEOUS) ×4 IMPLANT
DERMABOND ADVANCED (GAUZE/BANDAGES/DRESSINGS) ×1
DERMABOND ADVANCED .7 DNX12 (GAUZE/BANDAGES/DRESSINGS) ×1 IMPLANT
DRAPE C-ARM XRAY 36X54 (DRAPES) ×2 IMPLANT
ELECT CAUTERY BLADE 6.4 (BLADE) ×2 IMPLANT
ELECT REM PT RETURN 9FT ADLT (ELECTROSURGICAL) ×2
ELECTRODE REM PT RTRN 9FT ADLT (ELECTROSURGICAL) ×1 IMPLANT
GAUZE 4X4 16PLY ~~LOC~~+RFID DBL (SPONGE) ×2 IMPLANT
GLOVE SURG ORTHO LTX SZ7.5 (GLOVE) ×4 IMPLANT
GOWN STRL REUS W/ TWL LRG LVL3 (GOWN DISPOSABLE) ×2 IMPLANT
GOWN STRL REUS W/TWL LRG LVL3 (GOWN DISPOSABLE) ×4
IV NS 500ML (IV SOLUTION) ×2
IV NS 500ML BAXH (IV SOLUTION) ×1 IMPLANT
KIT PORT POWER 8FR ISP CVUE (Port) ×2 IMPLANT
KIT TURNOVER KIT A (KITS) ×2 IMPLANT
MANIFOLD NEPTUNE II (INSTRUMENTS) ×2 IMPLANT
NEEDLE HYPO 22GX1.5 SAFETY (NEEDLE) ×2 IMPLANT
PACK PORT-A-CATH (MISCELLANEOUS) ×2 IMPLANT
SUT MNCRL AB 4-0 PS2 18 (SUTURE) ×2 IMPLANT
SUT VIC AB 3-0 SH 27 (SUTURE) ×2
SUT VIC AB 3-0 SH 27X BRD (SUTURE) ×1 IMPLANT
SYR 10ML LL (SYRINGE) ×2 IMPLANT
SYR 3ML LL SCALE MARK (SYRINGE) ×2 IMPLANT
SYR 5ML LL (SYRINGE) ×2 IMPLANT
WATER STERILE IRR 500ML POUR (IV SOLUTION) ×2 IMPLANT

## 2021-01-01 NOTE — Interval H&P Note (Signed)
History and Physical Interval Note:  01/01/2021 12:28 PM  Briana Soto  has presented today for surgery, with the diagnosis of bilateral breast cancer.  The various methods of treatment have been discussed with the patient and family. After consideration of risks, benefits and other options for treatment, the patient has consented to  Procedure(s): INSERTION PORT-A-CATH (Right) as a surgical intervention.  The patient's history has been reviewed, patient examined, no change in status, stable for surgery.  I have reviewed the patient's chart and labs.  Questions were answered to the patient's satisfaction.   Right side is marked.   Ronny Bacon

## 2021-01-01 NOTE — Discharge Instructions (Addendum)
AMBULATORY SURGERY  DISCHARGE INSTRUCTIONS   The drugs that you were given will stay in your system until tomorrow so for the next 24 hours you should not:  Drive an automobile Make any legal decisions Drink any alcoholic beverage   You may resume regular meals tomorrow.  Today it is better to start with liquids and gradually work up to solid foods.  You may eat anything you prefer, but it is better to start with liquids, then soup and crackers, and gradually work up to solid foods.   Please notify your doctor immediately if you have any unusual bleeding, trouble breathing, redness and pain at the surgery site, drainage, fever, or pain not relieved by medication.    Additional Instructions:   TYLENOL 1,000 MG GIVEN AT [place] AT 12:17 PM.        Please contact your physician with any problems or Same Day Surgery at [phone], Monday through Friday 6 am to 4 pm, or [place] at [facility] number at [phone].

## 2021-01-01 NOTE — Anesthesia Postprocedure Evaluation (Signed)
Anesthesia Post Note  Patient: NATAYAH WARMACK  Procedure(s) Performed: INSERTION PORT-A-CATH (Right)  Patient location during evaluation: PACU Anesthesia Type: General Level of consciousness: awake and alert Pain management: pain level controlled Vital Signs Assessment: post-procedure vital signs reviewed and stable Respiratory status: spontaneous breathing, nonlabored ventilation, respiratory function stable and patient connected to nasal cannula oxygen Cardiovascular status: blood pressure returned to baseline and stable Postop Assessment: no apparent nausea or vomiting Anesthetic complications: no   No notable events documented.   Last Vitals:  Vitals:   01/01/21 1415 01/01/21 1443  BP: (!) 145/104 (!) 177/119  Pulse: 77 73  Resp: 20 18  Temp: (!) 36.2 C (!) 36.4 C  SpO2: 99% 99%    Last Pain:  Vitals:   01/01/21 1513  TempSrc:   PainSc: 0-No pain                 Brett Canales Alisea Matte

## 2021-01-01 NOTE — Op Note (Signed)
SURGICAL PROCEDURE REPORT  DATE OF PROCEDURE: 01/01/2021   ATTENDING SURGEON:  Ronny Bacon, M.D., FACS   ANESTHESIA: MAC with local.    PRE-OPERATIVE DIAGNOSIS: Advanced/Metastatic left breast cancer and right breast cancer, requiring durable central venous access for chemotherapy   POST-OPERATIVE DIAGNOSIS: Same.  PROCEDURE: (cpt: 16109, 60454)  Insertion of right subclavian vein Bard PowerPort central venous catheter with subcutaneous port under fluoroscopic guidance   INTRAOPERATIVE FINDINGS: Fluoroscopic confirmation of guidewire in superior vena cava, tip at fluoroscopy appears to be at caval atrial junction, excellent return and easy flush, well-secured tunneled central venous catheter with subcutaneous port at completion of the procedure    FLUOROSCOPY: as recorded.   ESTIMATED BLOOD LOSS: Minimal (<20 mL)   SPECIMENS: None   IMPLANTS: 52F tunneled Bard PowerPort central venous catheter with subcutaneous port  DRAINS: None   COMPLICATIONS: None apparent   CONDITION AT COMPLETION: Hemodynamically stable, awake   DISPOSITION: PACU   INDICATION(S) FOR PROCEDURE:  Patient is a 45 y.o. female who presented with advanced/metastatic left breast cancer requiring durable central venous access for chemotherapy. All risks, benefits, and alternatives to above elective procedures were discussed with the patient, who elected to proceed, and informed consent was accordingly obtained at that time.  DETAILS OF PROCEDURE:  Patient was brought to the operative suite and appropriately identified. Right subclavian access site and planned port placement site were prepped and draped in the usual sterile fashion. Following a brief timeout, in Trendelenburg position, percutaneous Right subclavian venous access was obtained using Seldinger technique, by which local anesthetic was injected over the right subclavian region, and access needle was inserted into the SCV vein, through which soft  guidewire was advanced, over which access needle was withdrawn. Length of catheter needed to position the catheter tip at the atrio-caval junction was then measured under direct fluoroscopic visualization, after which the catheter was cut to the measured length. Guidewire was secured, attention was directed to injection of local anesthetic along the planned port site, 2-3 cm transverse ipsilateral chest incision was made and confirmed to accommodate the subcutaneous port, and flushed measured catheter was attached to port, then the port was placed within the subcutaneous pocket. Insertion sheath was advanced over the guidewire, which was withdrawn along with the insertion sheath dilator.  The catheter was then advanced through the sheath into the SCV and SVC.  Port was confirmed to withdraw blood and flush easily, after which concentrated heparin was instilled into the port and catheter. Dermis at the subcutaneous pocket was re-approximated using buried interrupted 3-0 Vicryl suture, and 4-0 Monocryl suture was used to re-approximate skin at the insertion/subcutaneous port site in running subcuticular fashion for the subcutaneous port and buried interrupted fashion for the insertion site.  Using the straight Huebner needle the port was flushed with heparin at 1000 units/mL 4 to 5 mL was injected through the port.  Skin was cleaned, dried, and sterile skin glue was applied. Patient was then safely transferred to PACU for a chest x-ray.  I was present for all aspects of the procedures, and no intraprocedural complications were apparent.   Ronny Bacon, M.D., [facility] 01/01/2021 1:36 PM

## 2021-01-01 NOTE — Transfer of Care (Signed)
Immediate Anesthesia Transfer of Care Note  Patient: Briana Soto  Procedure(s) Performed: INSERTION PORT-A-CATH (Right)  Patient Location: PACU  Anesthesia Type:MAC  Level of Consciousness: awake, alert  and oriented  Airway & Oxygen Therapy: Patient Spontanous Breathing  Post-op Assessment: Report given to RN and Post -op Vital signs reviewed and stable  Post vital signs: Reviewed and stable  Last Vitals:  Vitals Value Taken Time  BP    Temp    Pulse    Resp    SpO2      Last Pain:  Vitals:   01/01/21 1208  TempSrc: Oral  PainSc: 4          Complications: No notable events documented.

## 2021-01-01 NOTE — Anesthesia Preprocedure Evaluation (Addendum)
Anesthesia Evaluation  Patient identified by MRN, date of birth, ID band Patient awake    Reviewed: Allergy & Precautions, H&P , NPO status , Patient's Chart, lab work & pertinent test results  History of Anesthesia Complications Negative for: history of anesthetic complications  Airway Mallampati: II  TM Distance: >3 FB     Dental   Pulmonary neg sleep apnea, neg COPD, Current Smoker,    Pulmonary exam normal        Cardiovascular hypertension (poorly controlled), Pt. on home beta blockers (-) angina(-) Past MI and (-) Cardiac Stents Normal cardiovascular exam(-) dysrhythmias      Neuro/Psych  Headaches, PSYCHIATRIC DISORDERS Anxiety    GI/Hepatic negative GI ROS, Neg liver ROS,   Endo/Other  negative endocrine ROS  Renal/GU negative Renal ROS  negative genitourinary   Musculoskeletal   Abdominal   Peds  Hematology negative hematology ROS (+)   Anesthesia Other Findings Past Medical History: No date: Anxiety No date: Headache No date: Hypertension No date: Noncompliance  Past Surgical History: 12/24/2020: BREAST BIOPSY; Right     Comment:  mass, 9:00 8 cmfn venus marker, path pending 12/24/2020: BREAST BIOPSY; Left     Comment:  mass 3:00 retroareolar, ribbon, path pending 12/24/2020: BREAST BIOPSY; Left     Comment:  mass 3:00 5cmfn, heart marker, path pending 12/24/2020: BREAST BIOPSY; Left     Comment:  axilla LN, hyrdomarker shape 3 coil, path pending No date: TUBAL LIGATION  BMI    Body Mass Index: 21.65 kg/m      Reproductive/Obstetrics negative OB ROS                            Anesthesia Physical Anesthesia Plan  ASA: 3  Anesthesia Plan: General   Post-op Pain Management:    Induction:   PONV Risk Score and Plan: Propofol infusion and TIVA  Airway Management Planned: Natural Airway  Additional Equipment:   Intra-op Plan:   Post-operative Plan:    Informed Consent: I have reviewed the patients History and Physical, chart, labs and discussed the procedure including the risks, benefits and alternatives for the proposed anesthesia with the patient or authorized representative who has indicated his/her understanding and acceptance.     Dental Advisory Given  Plan Discussed with: Anesthesiologist, CRNA and Surgeon  Anesthesia Plan Comments:         Anesthesia Quick Evaluation

## 2021-01-03 ENCOUNTER — Encounter: Payer: Self-pay | Admitting: Surgery

## 2021-01-03 NOTE — Progress Notes (Signed)
Reminded patient, and boyfriend Barbaraann Rondo of Med/Onc and Surgical consults 12/29/20. Will accompany to appintment.

## 2021-01-03 NOTE — Progress Notes (Signed)
Accompanied to initial Med/Onc visit with Dr. Grayland Ormond.  Give Breast cancer treatment Handbook. [facility] application completed and emailed to [place] in [place].

## 2021-01-08 ENCOUNTER — Encounter
Admission: RE | Admit: 2021-01-08 | Discharge: 2021-01-08 | Disposition: A | Discharge status: Home / Self Care | Payer: Medicaid Other | Source: Ambulatory Visit | Attending: Oncology | Admitting: Oncology

## 2021-01-08 DIAGNOSIS — C50919 Malignant neoplasm of unspecified site of unspecified female breast: Secondary | ICD-10-CM | POA: Diagnosis not present

## 2021-01-08 MED ORDER — TECHNETIUM TC 99M-LABELED RED BLOOD CELLS IV KIT
20.0000 | PACK | Freq: Once | INTRAVENOUS | Status: AC | PRN
  Administered 2021-01-08: 20.2 via INTRAVENOUS

## 2021-01-11 NOTE — Progress Notes (Signed)
[place]  Telephone:([phone] [phone]  ID: [name] OB: 11-Aug-1975  MR#: [id number]  TAV#:[id number]  Patient Care Team: Patient, [name] as PCP - General ([place]) Rico Junker, RN as Registered Nurse Theodore Demark, RN as Registered Nurse  CHIEF COMPLAINT: Bilateral breast cancer.  INTERVAL HISTORY: Patient returns to clinic today to discuss her imaging and final pathology results as well as treatment planning.  She is anxious, but otherwise feels well. She has no neurologic complaints.  She denies any recent fevers or illnesses.  She has a fair appetite, but denies weight loss.  She has no chest pain, shortness of breath, cough, or hemoptysis.  She denies any nausea, vomiting, constipation, or diarrhea.  She has no urinary complaints.  Patient offers no further specific complaints today.  REVIEW OF SYSTEMS:   Review of Systems  Constitutional: Negative.  Negative for chills, fever and malaise/fatigue.  Respiratory: Negative.  Negative for cough, hemoptysis and shortness of breath.   Cardiovascular: Negative.  Negative for chest pain and leg swelling.  Gastrointestinal: Negative.  Negative for abdominal pain and nausea.  Genitourinary: Negative.  Negative for dysuria.  Musculoskeletal: Negative.  Negative for back pain.  Skin: Negative.  Negative for rash.  Neurological: Negative.  Negative for dizziness, focal weakness, weakness and headaches.  Psychiatric/Behavioral:  The patient is nervous/anxious. The patient does not have insomnia.    As per HPI. Otherwise, a complete review of systems is negative.  PAST MEDICAL HISTORY: Past Medical History:  Diagnosis Date   Anxiety    Headache    Hypertension    Noncompliance     PAST SURGICAL HISTORY: Past Surgical History:  Procedure Laterality Date   BREAST BIOPSY Right 12/24/2020   mass, 9:00 8 cmfn venus marker, path pending   BREAST BIOPSY Left  12/24/2020   mass 3:00 retroareolar, ribbon, path pending   BREAST BIOPSY Left 12/24/2020   mass 3:00 5cmfn, heart marker, path pending   BREAST BIOPSY Left 12/24/2020   axilla LN, hyrdomarker shape 3 coil, path pending   PORTACATH PLACEMENT Right 01/01/2021   Procedure: INSERTION PORT-A-CATH;  Surgeon: Ronny Bacon, MD;  Location: [facility] ORS;  Service: General;  Laterality: Right;   TUBAL LIGATION      FAMILY HISTORY: Family History  Problem Relation Age of Onset   COPD Mother    Hypertension Mother    Heart disease Father     ADVANCED DIRECTIVES (Y/N):  N  HEALTH MAINTENANCE: Social History   Tobacco Use   Smoking status: Every Day    Packs/day: 1.50    Types: Cigarettes   Smokeless tobacco: Never  Vaping Use   Vaping Use: Never used  Substance Use Topics   Alcohol use: Yes    Alcohol/week: 14.0 standard drinks    Types: 14 Cans of beer per week    Comment: at least 2 beers daily   Drug use: No     Colonoscopy:  PAP:  Bone density:  Lipid panel:  Allergies  Allergen Reactions   Codeine Itching and Nausea And Vomiting    Current Outpatient Medications  Medication Sig Dispense Refill   ALPRAZolam (XANAX) 0.25 MG tablet Take 1 tablet (0.25 mg total) by mouth 2 (two) times daily as needed for anxiety. 60 tablet 0   bacitracin ointment Apply 1 application topically 2 (two) times daily. 120 g 0   diphenhydrAMINE-zinc acetate (BENADRYL EXTRA STRENGTH) cream Apply 1 application topically 3 (three) times daily  as needed for itching. 28.4 g 0   ibuprofen (ADVIL) 800 MG tablet Take 1 tablet (800 mg total) by mouth every 8 (eight) hours as needed. 30 tablet 0   labetalol (NORMODYNE) 100 MG tablet Take 1 tablet (100 mg total) by mouth 2 (two) times daily. 60 tablet 1   prochlorperazine (COMPAZINE) 10 MG tablet Take 1 tablet (10 mg total) by mouth every 6 (six) hours as needed for nausea or vomiting. 60 tablet 2   No current facility-administered medications for this  visit.    OBJECTIVE: Vitals:   01/12/21 0937  BP: (!) 169/110  Pulse: 91  Temp: 97.7 F (36.5 C)     Body mass index is 22.83 kg/m.    ECOG FS:0 - Asymptomatic  General: Well-developed, well-nourished, no acute distress. Eyes: Pink conjunctiva, anicteric sclera. HEENT: Normocephalic, moist mucous membranes. Lungs: No audible wheezing or coughing. Heart: Regular rate and rhythm. Abdomen: Soft, nontender, no obvious distention. Musculoskeletal: No edema, cyanosis, or clubbing. Neuro: Alert, answering all questions appropriately. Cranial nerves grossly intact. Skin: No rashes or petechiae noted. Psych: Normal affect.  LAB RESULTS:  Lab Results  Component Value Date   NA 132 (L) 01/01/2021   K 3.6 01/01/2021   CL 101 01/01/2021   CO2 25 01/01/2021   GLUCOSE 88 01/01/2021   BUN 7 01/01/2021   CREATININE 0.73 01/01/2021   CALCIUM 8.9 01/01/2021   PROT 7.4 01/01/2021   ALBUMIN 3.6 01/01/2021   AST 30 01/01/2021   ALT 28 01/01/2021   ALKPHOS 76 01/01/2021   BILITOT 0.8 01/01/2021   GFRNONAA >60 01/01/2021   GFRAA >60 12/01/2017    Lab Results  Component Value Date   WBC 6.1 01/01/2021   NEUTROABS 3.4 01/01/2021   HGB 11.9 (L) 01/01/2021   HCT 37.1 01/01/2021   MCV 90.5 01/01/2021   PLT 220 01/01/2021     STUDIES: NM Cardiac Muga Rest  Result Date: 01/08/2021 CLINICAL DATA:  Breast cancer. Evaluate cardiac function in relation to chemotherapy. EXAM: NUCLEAR MEDICINE CARDIAC BLOOD POOL IMAGING (MUGA) TECHNIQUE: Cardiac multi-gated acquisition was performed at rest following intravenous injection of Tc-68mlabeled red blood cells. RADIOPHARMACEUTICALS:  20.2 mCi Tc-961mertechnetate in-vitro labeled red blood cells IV COMPARISON:  None. FINDINGS: No  focal wall motion abnormality of the left ventricle. Calculated left ventricular ejection fraction equals 57.3 IMPRESSION: Left ventricular ejection fraction equals57.3 %. Electronically Signed   By: StSuzy BouchardM.D.   On: 01/08/2021 16:13   CT CHEST ABDOMEN PELVIS W CONTRAST  Result Date: 12/30/2020 CLINICAL DATA:  Bilateral breast cancer, current smoker. EXAM: CT CHEST, ABDOMEN, AND PELVIS WITH CONTRAST TECHNIQUE: Multidetector CT imaging of the chest, abdomen and pelvis was performed following the standard protocol during bolus administration of intravenous contrast. CONTRAST:  10070mMNIPAQUE IOHEXOL 300 MG/ML  SOLN COMPARISON:  CT chest 05/22/2020. FINDINGS: CT CHEST FINDINGS Cardiovascular: Heart size normal. Left ventricle appears hypertrophied. No pericardial effusion. Mediastinum/Nodes: No pathologically enlarged mediastinal, hilar, internal mammary or right axillary lymph nodes. Left axillary adenopathy measures up to 1.4 cm. Esophagus is grossly unremarkable. Lungs/Pleura: No suspicious pulmonary nodules. No pleural fluid. Airway is unremarkable. Musculoskeletal: Heterogeneous mass in the lateral left breast measures 3.4 x 5.4 cm. Comparison with the prior exam is difficult due to incomplete visualization on that study. A more cystic appearing subcutaneous mass deep to the skin surface in the left periareolar region measures 2.5 x 3.4 cm, unchanged. Overlying skin thickening. No worrisome lytic or sclerotic lesions. CT ABDOMEN  PELVIS FINDINGS Hepatobiliary: Liver is slightly decreased in attenuation diffusely. Liver and gallbladder are otherwise unremarkable. No biliary ductal dilatation. Pancreas: Negative. Spleen: Negative. Adrenals/Urinary Tract: Adrenal glands and right kidney are unremarkable. Scarring in the left kidney. Ureters are decompressed. Bladder is unremarkable. Stomach/Bowel: Stomach, small bowel, appendix and colon are unremarkable. Vascular/Lymphatic: Atherosclerotic calcification of the aorta. No pathologically enlarged lymph nodes. Reproductive: Uterus is visualized. Fluid in the endometrial canal. There may be cervical prominence. No adnexal mass. Other: No free fluid.  Mesenteries and  peritoneum are unremarkable. Musculoskeletal: No worrisome lytic or sclerotic lesions. IMPRESSION: 1. Complex left breast mass(es) with left axillary adenopathy, findings consistent with breast cancer. No evidence of distant metastatic disease. 2. Hepatic steatosis. 3. Fluid in endometrial canal. Possible cervical prominence. Consider pelvic ultrasound in further evaluation, as clinically indicated. 4.  Aortic atherosclerosis (ICD10-I70.0). Electronically Signed   By: Lorin Picket M.D.   On: 12/30/2020 15:53   DG Chest Port 1 View  Result Date: 01/01/2021 CLINICAL DATA:  Port-A-Cath insertion EXAM: PORTABLE CHEST 1 VIEW COMPARISON:  Chest CT 11/01/2020 FINDINGS: Right Port-A-Cath tip at superior caval/atrial junction. Midline trachea. Borderline cardiomegaly. Tortuous thoracic aorta. No pleural effusion or pneumothorax. Clear lungs. Linear density projects over the lower right chest, new since the prior. Numerous leads and wires project over the chest. IMPRESSION: Right Port-A-Cath tip at superior caval/right atrial junction, without pneumothorax. Linear density projecting over the lower right chest, of indeterminate etiology. Electronically Signed   By: Abigail Miyamoto M.D.   On: 01/01/2021 14:03   DG C-Arm 1-60 Min-No Report  Result Date: 01/01/2021 Fluoroscopy was utilized by the requesting physician.  No radiographic interpretation.   US BREAST LTD UNI LEFT INC AXILLA  Result Date: 12/15/2020 CLINICAL DATA:  Palpable abnormality in the LEFT breast for proximally 1 year. History of LEFT breast abscess. EXAM: DIGITAL DIAGNOSTIC BILATERAL MAMMOGRAM WITH TOMOSYNTHESIS AND CAD; ULTRASOUND LEFT BREAST LIMITED; ULTRASOUND RIGHT BREAST LIMITED TECHNIQUE: Bilateral digital diagnostic mammography and breast tomosynthesis was performed. The images were evaluated with computer-aided detection.; Targeted ultrasound examination of the left breast was performed.; Targeted ultrasound examination of the right  breast was performed COMPARISON:  None. ACR Breast Density Category c: The breast tissue is heterogeneously dense, which may obscure small masses. FINDINGS: RIGHT BREAST: Mammogram: Within the LATERAL portion of the RIGHT breast there is a partially obscured oval mass further evaluated with ultrasound. Spot tangential view of palpable mass in the UPPER RIGHT axilla shows superficial circumscribed mass. Mammographic images were processed with CAD. Physical Exam: There is a visible, protuberant mass in the UPPER RIGHT axilla, superficial to the humeral head. The patient is able to express a small amount of fluid from numerous sites on the dome of the mass. She has a history of being able to decompress the mass by expressing fluid over the last 5 years. I palpate soft nodularity without discrete mass in the LATERAL aspect of the RIGHT breast. Ultrasound: Targeted ultrasound is performed, showing a large circumscribed oval parallel mass with posterior acoustic enhancement corresponding to the area of patient's concern in the UPPER RIGHT axilla. Mass measures 2.6 x 3.0 x 1.2 centimeters. Internal blood flow confirmed on Doppler evaluation. Mass is superficial. In the LOWER axilla, lymph nodes with normal morphology are imaged. In the 9 o'clock location 8 centimeters from the nipple, there is an irregular taller than wide mass measuring 0.9 x 0.7 x 0.7 centimeters. Mass is vascular on Doppler evaluation. LEFT BREAST: Mammogram: There is a large, partially  obscured mass in the LATERAL aspect of the LEFT breast corresponding to the area of patient's concern. In the LATERAL periareolar region of the LEFT breast, there is a circumscribed mass associated with distortion of the nipple. Enlarged LEFT axillary lymph nodes are also identified. Mammographic images were processed with CAD. Physical Exam: I palpate a large mass in the 2-3 o'clock location of the LEFT breast corresponding to the area of concern. I palpate an oval  mobile mass just LATERAL to the LEFT nipple and distorting the nipple areolar complex. I palpate 2 discrete firm nodules in the LOWER LEFT axilla. Ultrasound: Targeted ultrasound is performed, showing an oval mass with indistinct and angular margins in the 3 o'clock location of the LEFT breast 5 centimeters from the nipple. Mass measures 5.1 x 2.6 x 4.6 centimeters and contains internal blood flow by Doppler evaluation. In the 1 o'clock location 3 centimeters from the nipple, a small satellite lesion is seen adjacent to this larger lesion. This mass contains internal blood flow and measures 0.9 x 0.8 x 1.0 centimeters. In the retroareolar region of the RIGHT breast, large circumscribed oval mass is 3.5 x 1.8 x 3 1 centimeter. Internal blood flow is confirmed on Doppler evaluation. In the LEFT axilla, there are at least 4 enlarged LEFT axillary lymph nodes. The two LOWER lymph nodes are palpable and have cortical thickening of up to 6 millimeters. IMPRESSION: 1. RIGHT breast: Palpable RIGHT axillary mass is consistent with benign intradermal inclusion cyst. Suspicious mass in the 9 o'clock location of the RIGHT breast for which biopsy is recommended. No RIGHT axillary adenopathy. 2. LEFT breast: Suspicious mass in the 3 o'clock location of the LEFT breast for which biopsy is recommended. Small satellite lesion adjacent to this mass is also suspicious. Circumscribed mass deforming the LEFT nipple areolar complex may be benign but warrants biopsy. Four enlarged LEFT axillary lymph nodes. RECOMMENDATION: 1. RIGHT breast: Recommend ultrasound-guided core biopsy of mass in the 9 o'clock location the RIGHT breast. Recommend clinical follow-up of RIGHT axillary mass, likely representing intradermal inclusion cyst. 2. LEFT breast: Recommend ultrasound-guided core biopsy of mass in the 3 o'clock location of the LEFT breast. Recommend ultrasound-guided core biopsy of mass in the LATERAL retroareolar region of the LEFT breast.  Recommend ultrasound-guided core biopsy of 1 of the LOWER LEFT axillary lymph nodes. 3. Plan is to have patient return on 2 separate days for the 4 biopsies needed. I have discussed the findings and recommendations with the patient. If applicable, a reminder letter will be sent to the patient regarding the next appointment. BI-RADS CATEGORY  5: Highly suggestive of malignancy. Electronically Signed   By: Nolon Nations M.D.   On: 12/15/2020 11:32  US BREAST LTD UNI RIGHT INC AXILLA  Result Date: 12/15/2020 CLINICAL DATA:  Palpable abnormality in the LEFT breast for proximally 1 year. History of LEFT breast abscess. EXAM: DIGITAL DIAGNOSTIC BILATERAL MAMMOGRAM WITH TOMOSYNTHESIS AND CAD; ULTRASOUND LEFT BREAST LIMITED; ULTRASOUND RIGHT BREAST LIMITED TECHNIQUE: Bilateral digital diagnostic mammography and breast tomosynthesis was performed. The images were evaluated with computer-aided detection.; Targeted ultrasound examination of the left breast was performed.; Targeted ultrasound examination of the right breast was performed COMPARISON:  None. ACR Breast Density Category c: The breast tissue is heterogeneously dense, which may obscure small masses. FINDINGS: RIGHT BREAST: Mammogram: Within the LATERAL portion of the RIGHT breast there is a partially obscured oval mass further evaluated with ultrasound. Spot tangential view of palpable mass in the UPPER RIGHT axilla shows  superficial circumscribed mass. Mammographic images were processed with CAD. Physical Exam: There is a visible, protuberant mass in the UPPER RIGHT axilla, superficial to the humeral head. The patient is able to express a small amount of fluid from numerous sites on the dome of the mass. She has a history of being able to decompress the mass by expressing fluid over the last 5 years. I palpate soft nodularity without discrete mass in the LATERAL aspect of the RIGHT breast. Ultrasound: Targeted ultrasound is performed, showing a large  circumscribed oval parallel mass with posterior acoustic enhancement corresponding to the area of patient's concern in the UPPER RIGHT axilla. Mass measures 2.6 x 3.0 x 1.2 centimeters. Internal blood flow confirmed on Doppler evaluation. Mass is superficial. In the LOWER axilla, lymph nodes with normal morphology are imaged. In the 9 o'clock location 8 centimeters from the nipple, there is an irregular taller than wide mass measuring 0.9 x 0.7 x 0.7 centimeters. Mass is vascular on Doppler evaluation. LEFT BREAST: Mammogram: There is a large, partially obscured mass in the LATERAL aspect of the LEFT breast corresponding to the area of patient's concern. In the LATERAL periareolar region of the LEFT breast, there is a circumscribed mass associated with distortion of the nipple. Enlarged LEFT axillary lymph nodes are also identified. Mammographic images were processed with CAD. Physical Exam: I palpate a large mass in the 2-3 o'clock location of the LEFT breast corresponding to the area of concern. I palpate an oval mobile mass just LATERAL to the LEFT nipple and distorting the nipple areolar complex. I palpate 2 discrete firm nodules in the LOWER LEFT axilla. Ultrasound: Targeted ultrasound is performed, showing an oval mass with indistinct and angular margins in the 3 o'clock location of the LEFT breast 5 centimeters from the nipple. Mass measures 5.1 x 2.6 x 4.6 centimeters and contains internal blood flow by Doppler evaluation. In the 1 o'clock location 3 centimeters from the nipple, a small satellite lesion is seen adjacent to this larger lesion. This mass contains internal blood flow and measures 0.9 x 0.8 x 1.0 centimeters. In the retroareolar region of the RIGHT breast, large circumscribed oval mass is 3.5 x 1.8 x 3 1 centimeter. Internal blood flow is confirmed on Doppler evaluation. In the LEFT axilla, there are at least 4 enlarged LEFT axillary lymph nodes. The two LOWER lymph nodes are palpable and have  cortical thickening of up to 6 millimeters. IMPRESSION: 1. RIGHT breast: Palpable RIGHT axillary mass is consistent with benign intradermal inclusion cyst. Suspicious mass in the 9 o'clock location of the RIGHT breast for which biopsy is recommended. No RIGHT axillary adenopathy. 2. LEFT breast: Suspicious mass in the 3 o'clock location of the LEFT breast for which biopsy is recommended. Small satellite lesion adjacent to this mass is also suspicious. Circumscribed mass deforming the LEFT nipple areolar complex may be benign but warrants biopsy. Four enlarged LEFT axillary lymph nodes. RECOMMENDATION: 1. RIGHT breast: Recommend ultrasound-guided core biopsy of mass in the 9 o'clock location the RIGHT breast. Recommend clinical follow-up of RIGHT axillary mass, likely representing intradermal inclusion cyst. 2. LEFT breast: Recommend ultrasound-guided core biopsy of mass in the 3 o'clock location of the LEFT breast. Recommend ultrasound-guided core biopsy of mass in the LATERAL retroareolar region of the LEFT breast. Recommend ultrasound-guided core biopsy of 1 of the LOWER LEFT axillary lymph nodes. 3. Plan is to have patient return on 2 separate days for the 4 biopsies needed. I have discussed the findings and  recommendations with the patient. If applicable, a reminder letter will be sent to the patient regarding the next appointment. BI-RADS CATEGORY  5: Highly suggestive of malignancy. Electronically Signed   By: Nolon Nations M.D.   On: 12/15/2020 11:32  [place] AXILLARY NODE CORE BIOPSY LEFT  Addendum Date: 12/29/2020   ADDENDUM REPORT: 12/29/2020 09:32 ADDENDUM: PATHOLOGY revealed: Site A. BREAST, LEFT RETROAREOLAR 3:00; ULTRASOUND-GUIDED BIOPSY: - PREDOMINANTLY KERATINACEOUS DEBRIS. - SINGLE BIOPSY FRAGMENT OF UNREMARKABLE SKIN. - NEGATIVE FOR ATYPIA AND MALIGNANCY. Comment: The histologic findings in part A (left retroareolar biopsy at 3:00) may represent a benign cutaneous cyst (e.g. inclusion cyst).  Correlation with clinical and radiographic findings is required. Pathology results are CONCORDANT with imaging findings, per Dr. Lovey Newcomer. PATHOLOGY revealed: Site B. BREAST, LEFT 3:00 5 CM FN; ULTRASOUND-GUIDED BIOPSY: - INVASIVE MAMMARY CARCINOMA, NO SPECIAL TYPE, WITH PLEOMORPHIC LOBULAR FEATURES. Size of invasive carcinoma: 11 mm in this sample. Grade 3. Ductal carcinoma in situ: Not identified. Pathology results are CONCORDANT with imaging findings, per Dr. Lovey Newcomer. PATHOLOGY revealed: Site C. LYMPH NODE, LEFT AXILLA; ULTRASOUND-GUIDED BIOPSY: - METASTATIC CARCINOMA MORPHOLOGICALLY CONSISTENT WITH CARCINOMA SAMPLED IN PART B INVOLVING A LYMPH NODE. Pathology results are CONCORDANT with imaging findings, per Dr. Lovey Newcomer. PATHOLOGY revealed: Site D. BREAST, RIGHT 9:00 8 CM FN; ULTRASOUND-GUIDED BIOPSY: - INVASIVE MAMMARY CARCINOMA, NO SPECIAL TYPE, WITH LOBULAR FEATURES. Size of invasive carcinoma: 5 mm in this sample. Grade 2. Ductal carcinoma in situ: Not identified. Lymphovascular invasion: Not identified. Comment: The carcinomas from the left (part B) and right (part D) breast are morphologically dissimilar. The definitive grade will be assigned on the excisional specimens. Pathology results are CONCORDANT with imaging findings, per Dr. Lovey Newcomer. Pathology results and recommendations below were discussed with patient and husband Barbaraann Rondo) via telephone on 12/25/2020. Patient reported biopsy site within normal limits with slight tenderness at the site. Post biopsy care instructions were reviewed, questions were answered and my direct phone number was provided to patient. Patient was instructed to call [facility] if any concerns or questions arise related to the biopsy. RECOMMENDATIONS: 1. Surgical consultation. Request for surgical consultation relayed to Al Pimple RN at [facility] by Electa Sniff RN on 12/25/2020. 2. Consider bilateral breast MRI given patient's  heterogeneously dense breast tissue, which may obscure small masses. Pathology results reported by Electa Sniff RN on 12/25/2020. Electronically Signed   By: Lovey Newcomer M.D.   On: 12/29/2020 09:32   Result Date: 12/29/2020 CLINICAL DATA:  Patient with indeterminate left breast masses and left axillary lymph node. EXAM: ULTRASOUND GUIDED LEFT BREAST CORE NEEDLE BIOPSY COMPARISON:  Previous exam(s). PROCEDURE: I met with the patient and we discussed the procedure of ultrasound-guided biopsy, including benefits and alternatives. We discussed the high likelihood of a successful procedure. We discussed the risks of the procedure, including infection, bleeding, tissue injury, clip migration, and inadequate sampling. Informed written consent was given. The usual time-out protocol was performed immediately prior to the procedure. Site 1: Left breast mass 3 o'clock position retroareolar location Lesion quadrant: Upper outer quadrant Using sterile technique and 1% Lidocaine as local anesthetic, under direct ultrasound visualization, a 14 gauge spring-loaded device was used to perform biopsy of left breast mass using a lateral approach. At the conclusion of the procedure ribbon shaped tissue marker clip was deployed into the biopsy cavity. Follow up 2 view mammogram was performed and dictated separately. Site 2: Left breast mass 3 o'clock position 5 cm from the nipple.  Lesion quadrant: Upper outer quadrant Using sterile technique and 1% Lidocaine as local anesthetic, under direct ultrasound visualization, a 14 gauge spring-loaded device was used to perform biopsy of left breast mass 3 o'clock position 5 cm from the nipple using a lateral approach. At the conclusion of the procedure heart shaped tissue marker clip was deployed into the biopsy cavity. Follow up 2 view mammogram was performed and dictated separately. Site 3: Left axillary lymph node Lesion quadrant: Upper outer quadrant Using sterile technique and 1% Lidocaine as  local anesthetic, under direct ultrasound visualization, a 14 gauge spring-loaded device was used to perform biopsy of left axillary lymph node using a lateral approach. At the conclusion of the procedure [facility] tissue marker clip was deployed into the biopsy cavity. Follow up 2 view mammogram was performed and dictated separately. IMPRESSION: Ultrasound guided biopsy of left breast masses and left axillary lymph node. No apparent complications. Electronically Signed: By: Lovey Newcomer M.D. On: 12/24/2020 10:04   MS DIGITAL DIAG TOMO BILAT  Result Date: 12/15/2020 CLINICAL DATA:  Palpable abnormality in the LEFT breast for proximally 1 year. History of LEFT breast abscess. EXAM: DIGITAL DIAGNOSTIC BILATERAL MAMMOGRAM WITH TOMOSYNTHESIS AND CAD; ULTRASOUND LEFT BREAST LIMITED; ULTRASOUND RIGHT BREAST LIMITED TECHNIQUE: Bilateral digital diagnostic mammography and breast tomosynthesis was performed. The images were evaluated with computer-aided detection.; Targeted ultrasound examination of the left breast was performed.; Targeted ultrasound examination of the right breast was performed COMPARISON:  None. ACR Breast Density Category c: The breast tissue is heterogeneously dense, which may obscure small masses. FINDINGS: RIGHT BREAST: Mammogram: Within the LATERAL portion of the RIGHT breast there is a partially obscured oval mass further evaluated with ultrasound. Spot tangential view of palpable mass in the UPPER RIGHT axilla shows superficial circumscribed mass. Mammographic images were processed with CAD. Physical Exam: There is a visible, protuberant mass in the UPPER RIGHT axilla, superficial to the humeral head. The patient is able to express a small amount of fluid from numerous sites on the dome of the mass. She has a history of being able to decompress the mass by expressing fluid over the last 5 years. I palpate soft nodularity without discrete mass in the LATERAL aspect of the RIGHT breast.  Ultrasound: Targeted ultrasound is performed, showing a large circumscribed oval parallel mass with posterior acoustic enhancement corresponding to the area of patient's concern in the UPPER RIGHT axilla. Mass measures 2.6 x 3.0 x 1.2 centimeters. Internal blood flow confirmed on Doppler evaluation. Mass is superficial. In the LOWER axilla, lymph nodes with normal morphology are imaged. In the 9 o'clock location 8 centimeters from the nipple, there is an irregular taller than wide mass measuring 0.9 x 0.7 x 0.7 centimeters. Mass is vascular on Doppler evaluation. LEFT BREAST: Mammogram: There is a large, partially obscured mass in the LATERAL aspect of the LEFT breast corresponding to the area of patient's concern. In the LATERAL periareolar region of the LEFT breast, there is a circumscribed mass associated with distortion of the nipple. Enlarged LEFT axillary lymph nodes are also identified. Mammographic images were processed with CAD. Physical Exam: I palpate a large mass in the 2-3 o'clock location of the LEFT breast corresponding to the area of concern. I palpate an oval mobile mass just LATERAL to the LEFT nipple and distorting the nipple areolar complex. I palpate 2 discrete firm nodules in the LOWER LEFT axilla. Ultrasound: Targeted ultrasound is performed, showing an oval mass with indistinct and angular margins in the 3 o'clock  location of the LEFT breast 5 centimeters from the nipple. Mass measures 5.1 x 2.6 x 4.6 centimeters and contains internal blood flow by Doppler evaluation. In the 1 o'clock location 3 centimeters from the nipple, a small satellite lesion is seen adjacent to this larger lesion. This mass contains internal blood flow and measures 0.9 x 0.8 x 1.0 centimeters. In the retroareolar region of the RIGHT breast, large circumscribed oval mass is 3.5 x 1.8 x 3 1 centimeter. Internal blood flow is confirmed on Doppler evaluation. In the LEFT axilla, there are at least 4 enlarged LEFT axillary  lymph nodes. The two LOWER lymph nodes are palpable and have cortical thickening of up to 6 millimeters. IMPRESSION: 1. RIGHT breast: Palpable RIGHT axillary mass is consistent with benign intradermal inclusion cyst. Suspicious mass in the 9 o'clock location of the RIGHT breast for which biopsy is recommended. No RIGHT axillary adenopathy. 2. LEFT breast: Suspicious mass in the 3 o'clock location of the LEFT breast for which biopsy is recommended. Small satellite lesion adjacent to this mass is also suspicious. Circumscribed mass deforming the LEFT nipple areolar complex may be benign but warrants biopsy. Four enlarged LEFT axillary lymph nodes. RECOMMENDATION: 1. RIGHT breast: Recommend ultrasound-guided core biopsy of mass in the 9 o'clock location the RIGHT breast. Recommend clinical follow-up of RIGHT axillary mass, likely representing intradermal inclusion cyst. 2. LEFT breast: Recommend ultrasound-guided core biopsy of mass in the 3 o'clock location of the LEFT breast. Recommend ultrasound-guided core biopsy of mass in the LATERAL retroareolar region of the LEFT breast. Recommend ultrasound-guided core biopsy of 1 of the LOWER LEFT axillary lymph nodes. 3. Plan is to have patient return on 2 separate days for the 4 biopsies needed. I have discussed the findings and recommendations with the patient. If applicable, a reminder letter will be sent to the patient regarding the next appointment. BI-RADS CATEGORY  5: Highly suggestive of malignancy. Electronically Signed   By: Nolon Nations M.D.   On: 12/15/2020 11:32  MS CLIP PLACEMENT LEFT  Result Date: 12/24/2020 CLINICAL DATA:  Patient status post ultrasound-guided core needle biopsy left breast masses and left axillary lymph node. EXAM: DIAGNOSTIC LEFT MAMMOGRAM POST ULTRASOUND BIOPSY COMPARISON:  Previous exam(s). FINDINGS: Mammographic images were obtained following ultrasound guided biopsy of left breast masses and left axillary lymph node. Site 1:  Left breast mass 3 o'clock position retroareolar location: Ribbon clip: In appropriate position. Site 2: Left breast mass 3 o'clock position 5 cm from the nipple: Heart shaped clip: In appropriate position. Site 3: Left axillary lymph node: HydroMARK clip: In appropriate position. IMPRESSION: Appropriate positioning of the biopsy marking clips as above. Final Assessment: Post Procedure Mammograms for Marker Placement Electronically Signed   By: Lovey Newcomer M.D.   On: 12/24/2020 10:06  MS CLIP PLACEMENT RIGHT  Result Date: 12/24/2020 CLINICAL DATA:  Patient status post ultrasound-guided biopsy right breast mass. EXAM: DIAGNOSTIC RIGHT MAMMOGRAM POST ULTRASOUND BIOPSY COMPARISON:  Previous exam(s). FINDINGS: Mammographic images were obtained following ultrasound guided biopsy of right breast mass. The biopsy marking clip is in expected position at the site of biopsy. IMPRESSION: Appropriate positioning of the venous shaped biopsy marking clip at the site of biopsy in the right breast mass. Final Assessment: Post Procedure Mammograms for Marker Placement Electronically Signed   By: Lovey Newcomer M.D.   On: 12/24/2020 10:05  [place] LT BREAST BX W LOC DEV 1ST LESION IMG BX SPEC US GUIDE  Addendum Date: 12/29/2020   ADDENDUM REPORT: 12/29/2020  09:32 ADDENDUM: PATHOLOGY revealed: Site A. BREAST, LEFT RETROAREOLAR 3:00; ULTRASOUND-GUIDED BIOPSY: - PREDOMINANTLY KERATINACEOUS DEBRIS. - SINGLE BIOPSY FRAGMENT OF UNREMARKABLE SKIN. - NEGATIVE FOR ATYPIA AND MALIGNANCY. Comment: The histologic findings in part A (left retroareolar biopsy at 3:00) may represent a benign cutaneous cyst (e.g. inclusion cyst). Correlation with clinical and radiographic findings is required. Pathology results are CONCORDANT with imaging findings, per Dr. Lovey Newcomer. PATHOLOGY revealed: Site B. BREAST, LEFT 3:00 5 CM FN; ULTRASOUND-GUIDED BIOPSY: - INVASIVE MAMMARY CARCINOMA, NO SPECIAL TYPE, WITH PLEOMORPHIC LOBULAR FEATURES. Size of invasive  carcinoma: 11 mm in this sample. Grade 3. Ductal carcinoma in situ: Not identified. Pathology results are CONCORDANT with imaging findings, per Dr. Lovey Newcomer. PATHOLOGY revealed: Site C. LYMPH NODE, LEFT AXILLA; ULTRASOUND-GUIDED BIOPSY: - METASTATIC CARCINOMA MORPHOLOGICALLY CONSISTENT WITH CARCINOMA SAMPLED IN PART B INVOLVING A LYMPH NODE. Pathology results are CONCORDANT with imaging findings, per Dr. Lovey Newcomer. PATHOLOGY revealed: Site D. BREAST, RIGHT 9:00 8 CM FN; ULTRASOUND-GUIDED BIOPSY: - INVASIVE MAMMARY CARCINOMA, NO SPECIAL TYPE, WITH LOBULAR FEATURES. Size of invasive carcinoma: 5 mm in this sample. Grade 2. Ductal carcinoma in situ: Not identified. Lymphovascular invasion: Not identified. Comment: The carcinomas from the left (part B) and right (part D) breast are morphologically dissimilar. The definitive grade will be assigned on the excisional specimens. Pathology results are CONCORDANT with imaging findings, per Dr. Lovey Newcomer. Pathology results and recommendations below were discussed with patient and husband Barbaraann Rondo) via telephone on 12/25/2020. Patient reported biopsy site within normal limits with slight tenderness at the site. Post biopsy care instructions were reviewed, questions were answered and my direct phone number was provided to patient. Patient was instructed to call [facility] if any concerns or questions arise related to the biopsy. RECOMMENDATIONS: 1. Surgical consultation. Request for surgical consultation relayed to Al Pimple RN at [facility] by Electa Sniff RN on 12/25/2020. 2. Consider bilateral breast MRI given patient's heterogeneously dense breast tissue, which may obscure small masses. Pathology results reported by Electa Sniff RN on 12/25/2020. Electronically Signed   By: Lovey Newcomer M.D.   On: 12/29/2020 09:32   Result Date: 12/29/2020 CLINICAL DATA:  Patient with indeterminate left breast masses and left axillary lymph node. EXAM:  ULTRASOUND GUIDED LEFT BREAST CORE NEEDLE BIOPSY COMPARISON:  Previous exam(s). PROCEDURE: I met with the patient and we discussed the procedure of ultrasound-guided biopsy, including benefits and alternatives. We discussed the high likelihood of a successful procedure. We discussed the risks of the procedure, including infection, bleeding, tissue injury, clip migration, and inadequate sampling. Informed written consent was given. The usual time-out protocol was performed immediately prior to the procedure. Site 1: Left breast mass 3 o'clock position retroareolar location Lesion quadrant: Upper outer quadrant Using sterile technique and 1% Lidocaine as local anesthetic, under direct ultrasound visualization, a 14 gauge spring-loaded device was used to perform biopsy of left breast mass using a lateral approach. At the conclusion of the procedure ribbon shaped tissue marker clip was deployed into the biopsy cavity. Follow up 2 view mammogram was performed and dictated separately. Site 2: Left breast mass 3 o'clock position 5 cm from the nipple. Lesion quadrant: Upper outer quadrant Using sterile technique and 1% Lidocaine as local anesthetic, under direct ultrasound visualization, a 14 gauge spring-loaded device was used to perform biopsy of left breast mass 3 o'clock position 5 cm from the nipple using a lateral approach. At the conclusion of the procedure heart shaped tissue marker clip was deployed  into the biopsy cavity. Follow up 2 view mammogram was performed and dictated separately. Site 3: Left axillary lymph node Lesion quadrant: Upper outer quadrant Using sterile technique and 1% Lidocaine as local anesthetic, under direct ultrasound visualization, a 14 gauge spring-loaded device was used to perform biopsy of left axillary lymph node using a lateral approach. At the conclusion of the procedure [facility] tissue marker clip was deployed into the biopsy cavity. Follow up 2 view mammogram was performed and  dictated separately. IMPRESSION: Ultrasound guided biopsy of left breast masses and left axillary lymph node. No apparent complications. Electronically Signed: By: Lovey Newcomer M.D. On: 12/24/2020 10:04   [place] LT BREAST BX W LOC DEV EA ADD LESION IMG BX SPEC US GUIDE  Addendum Date: 12/29/2020   ADDENDUM REPORT: 12/29/2020 09:32 ADDENDUM: PATHOLOGY revealed: Site A. BREAST, LEFT RETROAREOLAR 3:00; ULTRASOUND-GUIDED BIOPSY: - PREDOMINANTLY KERATINACEOUS DEBRIS. - SINGLE BIOPSY FRAGMENT OF UNREMARKABLE SKIN. - NEGATIVE FOR ATYPIA AND MALIGNANCY. Comment: The histologic findings in part A (left retroareolar biopsy at 3:00) may represent a benign cutaneous cyst (e.g. inclusion cyst). Correlation with clinical and radiographic findings is required. Pathology results are CONCORDANT with imaging findings, per Dr. Lovey Newcomer. PATHOLOGY revealed: Site B. BREAST, LEFT 3:00 5 CM FN; ULTRASOUND-GUIDED BIOPSY: - INVASIVE MAMMARY CARCINOMA, NO SPECIAL TYPE, WITH PLEOMORPHIC LOBULAR FEATURES. Size of invasive carcinoma: 11 mm in this sample. Grade 3. Ductal carcinoma in situ: Not identified. Pathology results are CONCORDANT with imaging findings, per Dr. Lovey Newcomer. PATHOLOGY revealed: Site C. LYMPH NODE, LEFT AXILLA; ULTRASOUND-GUIDED BIOPSY: - METASTATIC CARCINOMA MORPHOLOGICALLY CONSISTENT WITH CARCINOMA SAMPLED IN PART B INVOLVING A LYMPH NODE. Pathology results are CONCORDANT with imaging findings, per Dr. Lovey Newcomer. PATHOLOGY revealed: Site D. BREAST, RIGHT 9:00 8 CM FN; ULTRASOUND-GUIDED BIOPSY: - INVASIVE MAMMARY CARCINOMA, NO SPECIAL TYPE, WITH LOBULAR FEATURES. Size of invasive carcinoma: 5 mm in this sample. Grade 2. Ductal carcinoma in situ: Not identified. Lymphovascular invasion: Not identified. Comment: The carcinomas from the left (part B) and right (part D) breast are morphologically dissimilar. The definitive grade will be assigned on the excisional specimens. Pathology results are CONCORDANT with imaging  findings, per Dr. Lovey Newcomer. Pathology results and recommendations below were discussed with patient and husband Barbaraann Rondo) via telephone on 12/25/2020. Patient reported biopsy site within normal limits with slight tenderness at the site. Post biopsy care instructions were reviewed, questions were answered and my direct phone number was provided to patient. Patient was instructed to call [facility] if any concerns or questions arise related to the biopsy. RECOMMENDATIONS: 1. Surgical consultation. Request for surgical consultation relayed to Al Pimple RN at [facility] by Electa Sniff RN on 12/25/2020. 2. Consider bilateral breast MRI given patient's heterogeneously dense breast tissue, which may obscure small masses. Pathology results reported by Electa Sniff RN on 12/25/2020. Electronically Signed   By: Lovey Newcomer M.D.   On: 12/29/2020 09:32   Result Date: 12/29/2020 CLINICAL DATA:  Patient with indeterminate left breast masses and left axillary lymph node. EXAM: ULTRASOUND GUIDED LEFT BREAST CORE NEEDLE BIOPSY COMPARISON:  Previous exam(s). PROCEDURE: I met with the patient and we discussed the procedure of ultrasound-guided biopsy, including benefits and alternatives. We discussed the high likelihood of a successful procedure. We discussed the risks of the procedure, including infection, bleeding, tissue injury, clip migration, and inadequate sampling. Informed written consent was given. The usual time-out protocol was performed immediately prior to the procedure. Site 1: Left breast mass 3 o'clock position  retroareolar location Lesion quadrant: Upper outer quadrant Using sterile technique and 1% Lidocaine as local anesthetic, under direct ultrasound visualization, a 14 gauge spring-loaded device was used to perform biopsy of left breast mass using a lateral approach. At the conclusion of the procedure ribbon shaped tissue marker clip was deployed into the biopsy cavity. Follow up 2  view mammogram was performed and dictated separately. Site 2: Left breast mass 3 o'clock position 5 cm from the nipple. Lesion quadrant: Upper outer quadrant Using sterile technique and 1% Lidocaine as local anesthetic, under direct ultrasound visualization, a 14 gauge spring-loaded device was used to perform biopsy of left breast mass 3 o'clock position 5 cm from the nipple using a lateral approach. At the conclusion of the procedure heart shaped tissue marker clip was deployed into the biopsy cavity. Follow up 2 view mammogram was performed and dictated separately. Site 3: Left axillary lymph node Lesion quadrant: Upper outer quadrant Using sterile technique and 1% Lidocaine as local anesthetic, under direct ultrasound visualization, a 14 gauge spring-loaded device was used to perform biopsy of left axillary lymph node using a lateral approach. At the conclusion of the procedure [facility] tissue marker clip was deployed into the biopsy cavity. Follow up 2 view mammogram was performed and dictated separately. IMPRESSION: Ultrasound guided biopsy of left breast masses and left axillary lymph node. No apparent complications. Electronically Signed: By: Lovey Newcomer M.D. On: 12/24/2020 10:04   [place] RT BREAST BX W LOC DEV 1ST LESION IMG BX SPEC US GUIDE  Addendum Date: 12/29/2020   ADDENDUM REPORT: 12/29/2020 09:32 ADDENDUM: PATHOLOGY revealed: Site A. BREAST, LEFT RETROAREOLAR 3:00; ULTRASOUND-GUIDED BIOPSY: - PREDOMINANTLY KERATINACEOUS DEBRIS. - SINGLE BIOPSY FRAGMENT OF UNREMARKABLE SKIN. - NEGATIVE FOR ATYPIA AND MALIGNANCY. Comment: The histologic findings in part A (left retroareolar biopsy at 3:00) may represent a benign cutaneous cyst (e.g. inclusion cyst). Correlation with clinical and radiographic findings is required. Pathology results are CONCORDANT with imaging findings, per Dr. Lovey Newcomer. PATHOLOGY revealed: Site B. BREAST, LEFT 3:00 5 CM FN; ULTRASOUND-GUIDED BIOPSY: - INVASIVE MAMMARY CARCINOMA, NO  SPECIAL TYPE, WITH PLEOMORPHIC LOBULAR FEATURES. Size of invasive carcinoma: 11 mm in this sample. Grade 3. Ductal carcinoma in situ: Not identified. Pathology results are CONCORDANT with imaging findings, per Dr. Lovey Newcomer. PATHOLOGY revealed: Site C. LYMPH NODE, LEFT AXILLA; ULTRASOUND-GUIDED BIOPSY: - METASTATIC CARCINOMA MORPHOLOGICALLY CONSISTENT WITH CARCINOMA SAMPLED IN PART B INVOLVING A LYMPH NODE. Pathology results are CONCORDANT with imaging findings, per Dr. Lovey Newcomer. PATHOLOGY revealed: Site D. BREAST, RIGHT 9:00 8 CM FN; ULTRASOUND-GUIDED BIOPSY: - INVASIVE MAMMARY CARCINOMA, NO SPECIAL TYPE, WITH LOBULAR FEATURES. Size of invasive carcinoma: 5 mm in this sample. Grade 2. Ductal carcinoma in situ: Not identified. Lymphovascular invasion: Not identified. Comment: The carcinomas from the left (part B) and right (part D) breast are morphologically dissimilar. The definitive grade will be assigned on the excisional specimens. Pathology results are CONCORDANT with imaging findings, per Dr. Lovey Newcomer. Pathology results and recommendations below were discussed with patient and husband Barbaraann Rondo) via telephone on 12/25/2020. Patient reported biopsy site within normal limits with slight tenderness at the site. Post biopsy care instructions were reviewed, questions were answered and my direct phone number was provided to patient. Patient was instructed to call [facility] if any concerns or questions arise related to the biopsy. RECOMMENDATIONS: 1. Surgical consultation. Request for surgical consultation relayed to Al Pimple RN at [facility] by Electa Sniff RN on 12/25/2020. 2. Consider bilateral breast  MRI given patient's heterogeneously dense breast tissue, which may obscure small masses. Pathology results reported by Electa Sniff RN on 12/25/2020. Electronically Signed   By: Lovey Newcomer M.D.   On: 12/29/2020 09:32   Result Date: 12/29/2020 CLINICAL DATA:  Patient with suspicious  right breast mass. EXAM: ULTRASOUND GUIDED RIGHT BREAST CORE NEEDLE BIOPSY COMPARISON:  Previous exam(s). PROCEDURE: I met with the patient and we discussed the procedure of ultrasound-guided biopsy, including benefits and alternatives. We discussed the high likelihood of a successful procedure. We discussed the risks of the procedure, including infection, bleeding, tissue injury, clip migration, and inadequate sampling. Informed written consent was given. The usual time-out protocol was performed immediately prior to the procedure. Lesion quadrant: Upper outer quadrant Using sterile technique and 1% Lidocaine as local anesthetic, under direct ultrasound visualization, a 14 gauge spring-loaded device was used to perform biopsy of right breast mass 9 o'clock position using a lateral approach. At the conclusion of the procedure venous tissue marker clip was deployed into the biopsy cavity. Follow up 2 view mammogram was performed and dictated separately. IMPRESSION: Ultrasound guided biopsy of right breast mass 9 o'clock position. No apparent complications. Electronically Signed: By: Lovey Newcomer M.D. On: 12/24/2020 10:04    ASSESSMENT: Bilateral breast cancer.  PLAN:    1.  Bilateral breast cancer: Case discussed with pathology and this appears to be 2 distinct breast cancer primaries in her right and left breast both of which are ER/PR, HER2 negative malignancies.  CT scan completed on December 30, 2020 reviewed independently and reported as above did not reveal any metastatic disease.  MUGA scan on January 08, 2021 reported an EF of 57.3%.  Patient has also had port placement.  Previously, surgery recommended upfront systemic therapy. Given patient's age, she will also require genetic testing.  Plan to give neoadjuvant Adriamycin plus Cytoxan every 2 weeks with Neulasta support for 4 cycles followed by weekly Taxol for 12 cycles.  Return to clinic in 1 week for further evaluation and initiation of cycle 1 of 4  of Adriamycin and Cytoxan.   2.  Nausea: Patient does not complain of this today.  Continue Compazine as needed 3.  Anxiety/insomnia: Continue Xanax as needed.  I spent a total of 30 minutes reviewing chart data, face-to-face evaluation with the patient, counseling and coordination of care as detailed above.    Patient expressed understanding and was in agreement with this plan. She also understands that She can call clinic at any time with any questions, concerns, or complaints.    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[place]) Staging form: Breast, AJCC 8th Edition - Clinical stage from 01/12/2021: Stage IIB (cT2, cN1, cM0, G3, ER+, PR+, HER2-) - Signed by Lloyd Huger, MD on 01/12/2021 Stage prefix: Initial diagnosis Histologic grading system: 3 grade system  Invasive ductal carcinoma of right breast [facility]) Staging form: Breast, AJCC 8th Edition - Clinical stage from 01/12/2021: Stage IB (cT2, cN0, cM0, G2, ER+, PR+, HER2-) - Signed by Lloyd Huger, MD on 01/12/2021 Stage prefix: Initial diagnosis Histologic grading system: 3 grade system  Lloyd Huger, MD   01/12/2021 12:37 PM

## 2021-01-12 ENCOUNTER — Encounter: Payer: Self-pay | Admitting: Oncology

## 2021-01-12 ENCOUNTER — Inpatient Hospital Stay (HOSPITAL_BASED_OUTPATIENT_CLINIC_OR_DEPARTMENT_OTHER): Payer: Medicaid Other | Admitting: Oncology

## 2021-01-12 ENCOUNTER — Other Ambulatory Visit: Payer: Self-pay

## 2021-01-12 VITALS — BP 169/110 | HR 91 | Temp 97.7°F | Wt 133.0 lb

## 2021-01-12 DIAGNOSIS — C50912 Malignant neoplasm of unspecified site of left female breast: Secondary | ICD-10-CM | POA: Insufficient documentation

## 2021-01-12 DIAGNOSIS — C50911 Malignant neoplasm of unspecified site of right female breast: Secondary | ICD-10-CM

## 2021-01-12 DIAGNOSIS — C50411 Malignant neoplasm of upper-outer quadrant of right female breast: Secondary | ICD-10-CM

## 2021-01-12 DIAGNOSIS — C50412 Malignant neoplasm of upper-outer quadrant of left female breast: Secondary | ICD-10-CM

## 2021-01-12 DIAGNOSIS — C50811 Malignant neoplasm of overlapping sites of right female breast: Secondary | ICD-10-CM | POA: Diagnosis not present

## 2021-01-12 MED ORDER — LIDOCAINE-PRILOCAINE 2.5-2.5 % EX CREA: TOPICAL_CREAM | CUTANEOUS | 3 refills | Status: DC

## 2021-01-12 NOTE — Progress Notes (Signed)
START ON PATHWAY REGIMEN - Breast     Cycles 1 through 4: A cycle is every 14 days:     Doxorubicin      Cyclophosphamide      [id number]    Cycles 5 through 16: A cycle is every 7 days:     Paclitaxel   **Always confirm dose/schedule in your pharmacy ordering system**  Patient Characteristics: Preoperative or Nonsurgical Candidate (Clinical Staging), Neoadjuvant Therapy followed by Surgery, Invasive Disease, Chemotherapy, HER2 Negative/Unknown/Equivocal, ER Positive Therapeutic Status: Preoperative or Nonsurgical Candidate (Clinical Staging) AJCC M Category: cM0 AJCC Grade: G3 Breast Surgical Plan: Neoadjuvant Therapy followed by Surgery ER Status: Positive (+) AJCC 8 Stage Grouping: IIB HER2 Status: Negative (-) AJCC T Category: cT2 AJCC N Category: cN1 PR Status: Positive (+) Intent of Therapy: Curative Intent, Discussed with Patient

## 2021-01-18 ENCOUNTER — Other Ambulatory Visit: Payer: Self-pay

## 2021-01-18 ENCOUNTER — Inpatient Hospital Stay: Payer: Medicaid Other | Attending: Oncology

## 2021-01-18 ENCOUNTER — Other Ambulatory Visit: Payer: Self-pay | Admitting: Emergency Medicine

## 2021-01-18 DIAGNOSIS — C50912 Malignant neoplasm of unspecified site of left female breast: Secondary | ICD-10-CM

## 2021-01-18 DIAGNOSIS — C50911 Malignant neoplasm of unspecified site of right female breast: Secondary | ICD-10-CM

## 2021-01-18 MED ORDER — LIDOCAINE-PRILOCAINE 2.5-2.5 % EX CREA: TOPICAL_CREAM | CUTANEOUS | 3 refills | Status: DC

## 2021-01-18 NOTE — Progress Notes (Signed)
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  INTERVAL HISTORY: Patient returns to clinic today for further evaluation and consideration of cycle 1 of 4 of Adriamycin and Cytoxan.  She currently feels well and is asymptomatic. She has no neurologic complaints.  She denies any recent fevers or illnesses.  She has a fair appetite, but denies weight loss.  She has no chest pain, shortness of breath, cough, or hemoptysis.  She denies any nausea, vomiting, constipation, or diarrhea.  She has no urinary complaints.  Patient offers no specific complaints today.  REVIEW OF SYSTEMS:   Review of Systems  Constitutional: Negative.  Negative for chills, fever and malaise/fatigue.  Respiratory: Negative.  Negative for cough, hemoptysis and shortness of breath.   Cardiovascular: Negative.  Negative for chest pain and leg swelling.  Gastrointestinal: Negative.  Negative for abdominal pain and nausea.  Genitourinary: Negative.  Negative for dysuria.  Musculoskeletal: Negative.  Negative for back pain.  Skin: Negative.  Negative for rash.  Neurological: Negative.  Negative for dizziness, focal weakness, weakness and headaches.  Psychiatric/Behavioral: Negative.  The patient is not nervous/anxious and does not have insomnia.    As per HPI. Otherwise, a complete review of systems is negative.  PAST MEDICAL HISTORY: Past Medical History:  Diagnosis Date   Anxiety    Headache    Hypertension    Noncompliance     PAST SURGICAL HISTORY: Past Surgical History:  Procedure Laterality Date   BREAST BIOPSY Right 12/24/2020   mass, 9:00 8 cmfn venus marker, path pending   BREAST  BIOPSY Left 12/24/2020   mass 3:00 retroareolar, ribbon, path pending   BREAST BIOPSY Left 12/24/2020   mass 3:00 5cmfn, heart marker, path pending   BREAST BIOPSY Left 12/24/2020   axilla LN, hyrdomarker shape 3 coil, path pending   PORTACATH PLACEMENT Right 01/01/2021   Procedure: INSERTION PORT-A-CATH;  Surgeon: Ronny Bacon, MD;  Location: [facility] ORS;  Service: General;  Laterality: Right;   TUBAL LIGATION      FAMILY HISTORY: Family History  Problem Relation Age of Onset   COPD Mother    Hypertension Mother    Heart disease Father     ADVANCED DIRECTIVES (Y/N):  N  HEALTH MAINTENANCE: Social History   Tobacco Use   Smoking status: Every Day    Packs/day: 1.50    Types: Cigarettes   Smokeless tobacco: Never  Vaping Use   Vaping Use: Never used  Substance Use Topics   Alcohol use: Yes    Alcohol/week: 14.0 standard drinks    Types: 14 Cans of beer per week    Comment: at least 2 beers daily   Drug use: No     Colonoscopy:  PAP:  Bone density:  Lipid panel:  Allergies  Allergen Reactions   Codeine Itching and Nausea And Vomiting    Current Outpatient Medications  Medication Sig Dispense Refill   ALPRAZolam (XANAX) 0.25 MG tablet Take 1 tablet (0.25 mg total) by mouth 2 (two) times daily as needed for anxiety. 60 tablet 0   bacitracin ointment Apply 1 application topically 2 (two) times daily. 120 g 0   ibuprofen (ADVIL) 800 MG tablet Take 1 tablet (800 mg total)  by mouth every 8 (eight) hours as needed. 30 tablet 0   labetalol (NORMODYNE) 100 MG tablet Take 1 tablet (100 mg total) by mouth 2 (two) times daily. 60 tablet 1   lidocaine-prilocaine (EMLA) cream Apply to affected area once 30 g 3   ondansetron (ZOFRAN) 8 MG tablet Take 1 tablet (8 mg total) by mouth 2 (two) times daily. 60 tablet 2   prochlorperazine (COMPAZINE) 10 MG tablet Take 1 tablet (10 mg total) by mouth every 6 (six) hours as needed for nausea or vomiting. 60 tablet 2    diphenhydrAMINE-zinc acetate (BENADRYL EXTRA STRENGTH) cream Apply 1 application topically 3 (three) times daily as needed for itching. (Patient not taking: Reported on 01/21/2021) 28.4 g 0   No current facility-administered medications for this visit.   Facility-Administered Medications Ordered in Other Visits  Medication Dose Route Frequency Provider Last Rate Last Admin   cyclophosphamide (CYTOXAN) 1,000 mg in sodium chloride 0.9 % 250 mL chemo infusion  600 mg/m2 (Treatment Plan Recorded) Intravenous Once Lloyd Huger, MD       DOXOrubicin (ADRIAMYCIN) chemo injection 100 mg  60 mg/m2 (Treatment Plan Recorded) Intravenous Once Lloyd Huger, MD       fosaprepitant (EMEND) 150 mg in sodium chloride 0.9 % 145 mL IVPB  150 mg Intravenous Once Lloyd Huger, MD 450 mL/hr at 01/21/21 1043 150 mg at 01/21/21 1043   heparin lock flush 100 UNIT/ML injection            heparin lock flush 100 unit/mL  500 Units Intracatheter Once PRN Lloyd Huger, MD       sodium chloride flush (NS) 0.9 % injection 10 mL  10 mL Intracatheter PRN Lloyd Huger, MD        OBJECTIVE: Vitals:   01/21/21 0935  BP: (!) 144/96  Pulse: 83  Resp: 16  Temp: (!) 96.5 F (35.8 C)  SpO2: 100%     Body mass index is 23.28 kg/m.    ECOG FS:0 - Asymptomatic  General: Well-developed, well-nourished, no acute distress. Eyes: Pink conjunctiva, anicteric sclera. HEENT: Normocephalic, moist mucous membranes. Lungs: No audible wheezing or coughing. Heart: Regular rate and rhythm. Abdomen: Soft, nontender, no obvious distention. Musculoskeletal: No edema, cyanosis, or clubbing. Neuro: Alert, answering all questions appropriately. Cranial nerves grossly intact. Skin: No rashes or petechiae noted. Psych: Normal affect.  LAB RESULTS:  Lab Results  Component Value Date   NA 136 01/21/2021   K 4.0 01/21/2021   CL 101 01/21/2021   CO2 25 01/21/2021   GLUCOSE 106 (H) 01/21/2021   BUN 8  01/21/2021   CREATININE 0.74 01/21/2021   CALCIUM 9.0 01/21/2021   PROT 7.4 01/21/2021   ALBUMIN 3.9 01/21/2021   AST 19 01/21/2021   ALT 14 01/21/2021   ALKPHOS 60 01/21/2021   BILITOT 0.4 01/21/2021   GFRNONAA >60 01/21/2021   GFRAA >60 12/01/2017    Lab Results  Component Value Date   WBC 5.4 01/21/2021   NEUTROABS 2.8 01/21/2021   HGB 12.0 01/21/2021   HCT 37.1 01/21/2021   MCV 91.4 01/21/2021   PLT 301 01/21/2021     STUDIES: NM Cardiac Muga Rest  Result Date: 01/08/2021 CLINICAL DATA:  Breast cancer. Evaluate cardiac function in relation to chemotherapy. EXAM: NUCLEAR MEDICINE CARDIAC BLOOD POOL IMAGING (MUGA) TECHNIQUE: Cardiac multi-gated acquisition was performed at rest following intravenous injection of Tc-54mlabeled red blood cells. RADIOPHARMACEUTICALS:  20.2 mCi Tc-923mertechnetate in-vitro labeled red blood cells  IV COMPARISON:  None. FINDINGS: No  focal wall motion abnormality of the left ventricle. Calculated left ventricular ejection fraction equals 57.3 IMPRESSION: Left ventricular ejection fraction equals57.3 %. Electronically Signed   By: Suzy Bouchard M.D.   On: 01/08/2021 16:13   CT CHEST ABDOMEN PELVIS W CONTRAST  Result Date: 12/30/2020 CLINICAL DATA:  Bilateral breast cancer, current smoker. EXAM: CT CHEST, ABDOMEN, AND PELVIS WITH CONTRAST TECHNIQUE: Multidetector CT imaging of the chest, abdomen and pelvis was performed following the standard protocol during bolus administration of intravenous contrast. CONTRAST:  12m OMNIPAQUE IOHEXOL 300 MG/ML  SOLN COMPARISON:  CT chest 05/22/2020. FINDINGS: CT CHEST FINDINGS Cardiovascular: Heart size normal. Left ventricle appears hypertrophied. No pericardial effusion. Mediastinum/Nodes: No pathologically enlarged mediastinal, hilar, internal mammary or right axillary lymph nodes. Left axillary adenopathy measures up to 1.4 cm. Esophagus is grossly unremarkable. Lungs/Pleura: No suspicious pulmonary nodules. No  pleural fluid. Airway is unremarkable. Musculoskeletal: Heterogeneous mass in the lateral left breast measures 3.4 x 5.4 cm. Comparison with the prior exam is difficult due to incomplete visualization on that study. A more cystic appearing subcutaneous mass deep to the skin surface in the left periareolar region measures 2.5 x 3.4 cm, unchanged. Overlying skin thickening. No worrisome lytic or sclerotic lesions. CT ABDOMEN PELVIS FINDINGS Hepatobiliary: Liver is slightly decreased in attenuation diffusely. Liver and gallbladder are otherwise unremarkable. No biliary ductal dilatation. Pancreas: Negative. Spleen: Negative. Adrenals/Urinary Tract: Adrenal glands and right kidney are unremarkable. Scarring in the left kidney. Ureters are decompressed. Bladder is unremarkable. Stomach/Bowel: Stomach, small bowel, appendix and colon are unremarkable. Vascular/Lymphatic: Atherosclerotic calcification of the aorta. No pathologically enlarged lymph nodes. Reproductive: Uterus is visualized. Fluid in the endometrial canal. There may be cervical prominence. No adnexal mass. Other: No free fluid.  Mesenteries and peritoneum are unremarkable. Musculoskeletal: No worrisome lytic or sclerotic lesions. IMPRESSION: 1. Complex left breast mass(es) with left axillary adenopathy, findings consistent with breast cancer. No evidence of distant metastatic disease. 2. Hepatic steatosis. 3. Fluid in endometrial canal. Possible cervical prominence. Consider pelvic ultrasound in further evaluation, as clinically indicated. 4.  Aortic atherosclerosis (ICD10-I70.0). Electronically Signed   By: MLorin PicketM.D.   On: 12/30/2020 15:53   DG Chest Port 1 View  Result Date: 01/01/2021 CLINICAL DATA:  Port-A-Cath insertion EXAM: PORTABLE CHEST 1 VIEW COMPARISON:  Chest CT 11/01/2020 FINDINGS: Right Port-A-Cath tip at superior caval/atrial junction. Midline trachea. Borderline cardiomegaly. Tortuous thoracic aorta. No pleural effusion or  pneumothorax. Clear lungs. Linear density projects over the lower right chest, new since the prior. Numerous leads and wires project over the chest. IMPRESSION: Right Port-A-Cath tip at superior caval/right atrial junction, without pneumothorax. Linear density projecting over the lower right chest, of indeterminate etiology. Electronically Signed   By: KAbigail MiyamotoM.D.   On: 01/01/2021 14:03   DG C-Arm 1-60 Min-No Report  Result Date: 01/01/2021 Fluoroscopy was utilized by the requesting physician.  No radiographic interpretation.   [place] CORE BIOPSY LEFT  Addendum Date: 12/29/2020   ADDENDUM REPORT: 12/29/2020 09:32 ADDENDUM: PATHOLOGY revealed: Site A. BREAST, LEFT RETROAREOLAR 3:00; ULTRASOUND-GUIDED BIOPSY: - PREDOMINANTLY KERATINACEOUS DEBRIS. - SINGLE BIOPSY FRAGMENT OF UNREMARKABLE SKIN. - NEGATIVE FOR ATYPIA AND MALIGNANCY. Comment: The histologic findings in part A (left retroareolar biopsy at 3:00) may represent a benign cutaneous cyst (e.g. inclusion cyst). Correlation with clinical and radiographic findings is required. Pathology results are CONCORDANT with imaging findings, per Dr. DLovey Newcomer PATHOLOGY revealed: Site B. BREAST, LEFT 3:00 5 CM FN;  ULTRASOUND-GUIDED BIOPSY: - INVASIVE MAMMARY CARCINOMA, NO SPECIAL TYPE, WITH PLEOMORPHIC LOBULAR FEATURES. Size of invasive carcinoma: 11 mm in this sample. Grade 3. Ductal carcinoma in situ: Not identified. Pathology results are CONCORDANT with imaging findings, per Dr. Lovey Newcomer. PATHOLOGY revealed: Site C. LYMPH NODE, LEFT AXILLA; ULTRASOUND-GUIDED BIOPSY: - METASTATIC CARCINOMA MORPHOLOGICALLY CONSISTENT WITH CARCINOMA SAMPLED IN PART B INVOLVING A LYMPH NODE. Pathology results are CONCORDANT with imaging findings, per Dr. Lovey Newcomer. PATHOLOGY revealed: Site D. BREAST, RIGHT 9:00 8 CM FN; ULTRASOUND-GUIDED BIOPSY: - INVASIVE MAMMARY CARCINOMA, NO SPECIAL TYPE, WITH LOBULAR FEATURES. Size of invasive carcinoma: 5 mm in this sample.  Grade 2. Ductal carcinoma in situ: Not identified. Lymphovascular invasion: Not identified. Comment: The carcinomas from the left (part B) and right (part D) breast are morphologically dissimilar. The definitive grade will be assigned on the excisional specimens. Pathology results are CONCORDANT with imaging findings, per Dr. Lovey Newcomer. Pathology results and recommendations below were discussed with patient and husband Barbaraann Rondo) via telephone on 12/25/2020. Patient reported biopsy site within normal limits with slight tenderness at the site. Post biopsy care instructions were reviewed, questions were answered and my direct phone number was provided to patient. Patient was instructed to call [facility] if any concerns or questions arise related to the biopsy. RECOMMENDATIONS: 1. Surgical consultation. Request for surgical consultation relayed to Al Pimple RN at [facility] by Electa Sniff RN on 12/25/2020. 2. Consider bilateral breast MRI given patient's heterogeneously dense breast tissue, which may obscure small masses. Pathology results reported by Electa Sniff RN on 12/25/2020. Electronically Signed   By: Lovey Newcomer M.D.   On: 12/29/2020 09:32   Result Date: 12/29/2020 CLINICAL DATA:  Patient with indeterminate left breast masses and left axillary lymph node. EXAM: ULTRASOUND GUIDED LEFT BREAST CORE NEEDLE BIOPSY COMPARISON:  Previous exam(s). PROCEDURE: I met with the patient and we discussed the procedure of ultrasound-guided biopsy, including benefits and alternatives. We discussed the high likelihood of a successful procedure. We discussed the risks of the procedure, including infection, bleeding, tissue injury, clip migration, and inadequate sampling. Informed written consent was given. The usual time-out protocol was performed immediately prior to the procedure. Site 1: Left breast mass 3 o'clock position retroareolar location Lesion quadrant: Upper outer quadrant Using sterile  technique and 1% Lidocaine as local anesthetic, under direct ultrasound visualization, a 14 gauge spring-loaded device was used to perform biopsy of left breast mass using a lateral approach. At the conclusion of the procedure ribbon shaped tissue marker clip was deployed into the biopsy cavity. Follow up 2 view mammogram was performed and dictated separately. Site 2: Left breast mass 3 o'clock position 5 cm from the nipple. Lesion quadrant: Upper outer quadrant Using sterile technique and 1% Lidocaine as local anesthetic, under direct ultrasound visualization, a 14 gauge spring-loaded device was used to perform biopsy of left breast mass 3 o'clock position 5 cm from the nipple using a lateral approach. At the conclusion of the procedure heart shaped tissue marker clip was deployed into the biopsy cavity. Follow up 2 view mammogram was performed and dictated separately. Site 3: Left axillary lymph node Lesion quadrant: Upper outer quadrant Using sterile technique and 1% Lidocaine as local anesthetic, under direct ultrasound visualization, a 14 gauge spring-loaded device was used to perform biopsy of left axillary lymph node using a lateral approach. At the conclusion of the procedure [facility] tissue marker clip was deployed into the biopsy cavity. Follow up 2 view mammogram was  performed and dictated separately. IMPRESSION: Ultrasound guided biopsy of left breast masses and left axillary lymph node. No apparent complications. Electronically Signed: By: Lovey Newcomer M.D. On: 12/24/2020 10:04   MS CLIP PLACEMENT LEFT  Result Date: 12/24/2020 CLINICAL DATA:  Patient status post ultrasound-guided core needle biopsy left breast masses and left axillary lymph node. EXAM: DIAGNOSTIC LEFT MAMMOGRAM POST ULTRASOUND BIOPSY COMPARISON:  Previous exam(s). FINDINGS: Mammographic images were obtained following ultrasound guided biopsy of left breast masses and left axillary lymph node. Site 1: Left breast mass 3 o'clock  position retroareolar location: Ribbon clip: In appropriate position. Site 2: Left breast mass 3 o'clock position 5 cm from the nipple: Heart shaped clip: In appropriate position. Site 3: Left axillary lymph node: HydroMARK clip: In appropriate position. IMPRESSION: Appropriate positioning of the biopsy marking clips as above. Final Assessment: Post Procedure Mammograms for Marker Placement Electronically Signed   By: Lovey Newcomer M.D.   On: 12/24/2020 10:06  MS CLIP PLACEMENT RIGHT  Result Date: 12/24/2020 CLINICAL DATA:  Patient status post ultrasound-guided biopsy right breast mass. EXAM: DIAGNOSTIC RIGHT MAMMOGRAM POST ULTRASOUND BIOPSY COMPARISON:  Previous exam(s). FINDINGS: Mammographic images were obtained following ultrasound guided biopsy of right breast mass. The biopsy marking clip is in expected position at the site of biopsy. IMPRESSION: Appropriate positioning of the venous shaped biopsy marking clip at the site of biopsy in the right breast mass. Final Assessment: Post Procedure Mammograms for Marker Placement Electronically Signed   By: Lovey Newcomer M.D.   On: 12/24/2020 10:05  [place] LT BREAST BX W LOC DEV 1ST LESION IMG BX SPEC US GUIDE  Addendum Date: 12/29/2020   ADDENDUM REPORT: 12/29/2020 09:32 ADDENDUM: PATHOLOGY revealed: Site A. BREAST, LEFT RETROAREOLAR 3:00; ULTRASOUND-GUIDED BIOPSY: - PREDOMINANTLY KERATINACEOUS DEBRIS. - SINGLE BIOPSY FRAGMENT OF UNREMARKABLE SKIN. - NEGATIVE FOR ATYPIA AND MALIGNANCY. Comment: The histologic findings in part A (left retroareolar biopsy at 3:00) may represent a benign cutaneous cyst (e.g. inclusion cyst). Correlation with clinical and radiographic findings is required. Pathology results are CONCORDANT with imaging findings, per Dr. Lovey Newcomer. PATHOLOGY revealed: Site B. BREAST, LEFT 3:00 5 CM FN; ULTRASOUND-GUIDED BIOPSY: - INVASIVE MAMMARY CARCINOMA, NO SPECIAL TYPE, WITH PLEOMORPHIC LOBULAR FEATURES. Size of invasive carcinoma: 11 mm in this  sample. Grade 3. Ductal carcinoma in situ: Not identified. Pathology results are CONCORDANT with imaging findings, per Dr. Lovey Newcomer. PATHOLOGY revealed: Site C. LYMPH NODE, LEFT AXILLA; ULTRASOUND-GUIDED BIOPSY: - METASTATIC CARCINOMA MORPHOLOGICALLY CONSISTENT WITH CARCINOMA SAMPLED IN PART B INVOLVING A LYMPH NODE. Pathology results are CONCORDANT with imaging findings, per Dr. Lovey Newcomer. PATHOLOGY revealed: Site D. BREAST, RIGHT 9:00 8 CM FN; ULTRASOUND-GUIDED BIOPSY: - INVASIVE MAMMARY CARCINOMA, NO SPECIAL TYPE, WITH LOBULAR FEATURES. Size of invasive carcinoma: 5 mm in this sample. Grade 2. Ductal carcinoma in situ: Not identified. Lymphovascular invasion: Not identified. Comment: The carcinomas from the left (part B) and right (part D) breast are morphologically dissimilar. The definitive grade will be assigned on the excisional specimens. Pathology results are CONCORDANT with imaging findings, per Dr. Lovey Newcomer. Pathology results and recommendations below were discussed with patient and husband Barbaraann Rondo) via telephone on 12/25/2020. Patient reported biopsy site within normal limits with slight tenderness at the site. Post biopsy care instructions were reviewed, questions were answered and my direct phone number was provided to patient. Patient was instructed to call [facility] if any concerns or questions arise related to the biopsy. RECOMMENDATIONS: 1. Surgical consultation. Request for surgical consultation relayed to  Al Pimple RN at [facility] by Electa Sniff RN on 12/25/2020. 2. Consider bilateral breast MRI given patient's heterogeneously dense breast tissue, which may obscure small masses. Pathology results reported by Electa Sniff RN on 12/25/2020. Electronically Signed   By: Lovey Newcomer M.D.   On: 12/29/2020 09:32   Result Date: 12/29/2020 CLINICAL DATA:  Patient with indeterminate left breast masses and left axillary lymph node. EXAM: ULTRASOUND GUIDED LEFT BREAST  CORE NEEDLE BIOPSY COMPARISON:  Previous exam(s). PROCEDURE: I met with the patient and we discussed the procedure of ultrasound-guided biopsy, including benefits and alternatives. We discussed the high likelihood of a successful procedure. We discussed the risks of the procedure, including infection, bleeding, tissue injury, clip migration, and inadequate sampling. Informed written consent was given. The usual time-out protocol was performed immediately prior to the procedure. Site 1: Left breast mass 3 o'clock position retroareolar location Lesion quadrant: Upper outer quadrant Using sterile technique and 1% Lidocaine as local anesthetic, under direct ultrasound visualization, a 14 gauge spring-loaded device was used to perform biopsy of left breast mass using a lateral approach. At the conclusion of the procedure ribbon shaped tissue marker clip was deployed into the biopsy cavity. Follow up 2 view mammogram was performed and dictated separately. Site 2: Left breast mass 3 o'clock position 5 cm from the nipple. Lesion quadrant: Upper outer quadrant Using sterile technique and 1% Lidocaine as local anesthetic, under direct ultrasound visualization, a 14 gauge spring-loaded device was used to perform biopsy of left breast mass 3 o'clock position 5 cm from the nipple using a lateral approach. At the conclusion of the procedure heart shaped tissue marker clip was deployed into the biopsy cavity. Follow up 2 view mammogram was performed and dictated separately. Site 3: Left axillary lymph node Lesion quadrant: Upper outer quadrant Using sterile technique and 1% Lidocaine as local anesthetic, under direct ultrasound visualization, a 14 gauge spring-loaded device was used to perform biopsy of left axillary lymph node using a lateral approach. At the conclusion of the procedure [facility] tissue marker clip was deployed into the biopsy cavity. Follow up 2 view mammogram was performed and dictated separately. IMPRESSION:  Ultrasound guided biopsy of left breast masses and left axillary lymph node. No apparent complications. Electronically Signed: By: Lovey Newcomer M.D. On: 12/24/2020 10:04   [place] LT BREAST BX W LOC DEV EA ADD LESION IMG BX SPEC US GUIDE  Addendum Date: 12/29/2020   ADDENDUM REPORT: 12/29/2020 09:32 ADDENDUM: PATHOLOGY revealed: Site A. BREAST, LEFT RETROAREOLAR 3:00; ULTRASOUND-GUIDED BIOPSY: - PREDOMINANTLY KERATINACEOUS DEBRIS. - SINGLE BIOPSY FRAGMENT OF UNREMARKABLE SKIN. - NEGATIVE FOR ATYPIA AND MALIGNANCY. Comment: The histologic findings in part A (left retroareolar biopsy at 3:00) may represent a benign cutaneous cyst (e.g. inclusion cyst). Correlation with clinical and radiographic findings is required. Pathology results are CONCORDANT with imaging findings, per Dr. Lovey Newcomer. PATHOLOGY revealed: Site B. BREAST, LEFT 3:00 5 CM FN; ULTRASOUND-GUIDED BIOPSY: - INVASIVE MAMMARY CARCINOMA, NO SPECIAL TYPE, WITH PLEOMORPHIC LOBULAR FEATURES. Size of invasive carcinoma: 11 mm in this sample. Grade 3. Ductal carcinoma in situ: Not identified. Pathology results are CONCORDANT with imaging findings, per Dr. Lovey Newcomer. PATHOLOGY revealed: Site C. LYMPH NODE, LEFT AXILLA; ULTRASOUND-GUIDED BIOPSY: - METASTATIC CARCINOMA MORPHOLOGICALLY CONSISTENT WITH CARCINOMA SAMPLED IN PART B INVOLVING A LYMPH NODE. Pathology results are CONCORDANT with imaging findings, per Dr. Lovey Newcomer. PATHOLOGY revealed: Site D. BREAST, RIGHT 9:00 8 CM FN; ULTRASOUND-GUIDED BIOPSY: - INVASIVE MAMMARY CARCINOMA, NO SPECIAL TYPE, WITH  LOBULAR FEATURES. Size of invasive carcinoma: 5 mm in this sample. Grade 2. Ductal carcinoma in situ: Not identified. Lymphovascular invasion: Not identified. Comment: The carcinomas from the left (part B) and right (part D) breast are morphologically dissimilar. The definitive grade will be assigned on the excisional specimens. Pathology results are CONCORDANT with imaging findings, per Dr. Lovey Newcomer.  Pathology results and recommendations below were discussed with patient and husband Barbaraann Rondo) via telephone on 12/25/2020. Patient reported biopsy site within normal limits with slight tenderness at the site. Post biopsy care instructions were reviewed, questions were answered and my direct phone number was provided to patient. Patient was instructed to call [facility] if any concerns or questions arise related to the biopsy. RECOMMENDATIONS: 1. Surgical consultation. Request for surgical consultation relayed to Al Pimple RN at [facility] by Electa Sniff RN on 12/25/2020. 2. Consider bilateral breast MRI given patient's heterogeneously dense breast tissue, which may obscure small masses. Pathology results reported by Electa Sniff RN on 12/25/2020. Electronically Signed   By: Lovey Newcomer M.D.   On: 12/29/2020 09:32   Result Date: 12/29/2020 CLINICAL DATA:  Patient with indeterminate left breast masses and left axillary lymph node. EXAM: ULTRASOUND GUIDED LEFT BREAST CORE NEEDLE BIOPSY COMPARISON:  Previous exam(s). PROCEDURE: I met with the patient and we discussed the procedure of ultrasound-guided biopsy, including benefits and alternatives. We discussed the high likelihood of a successful procedure. We discussed the risks of the procedure, including infection, bleeding, tissue injury, clip migration, and inadequate sampling. Informed written consent was given. The usual time-out protocol was performed immediately prior to the procedure. Site 1: Left breast mass 3 o'clock position retroareolar location Lesion quadrant: Upper outer quadrant Using sterile technique and 1% Lidocaine as local anesthetic, under direct ultrasound visualization, a 14 gauge spring-loaded device was used to perform biopsy of left breast mass using a lateral approach. At the conclusion of the procedure ribbon shaped tissue marker clip was deployed into the biopsy cavity. Follow up 2 view mammogram was performed  and dictated separately. Site 2: Left breast mass 3 o'clock position 5 cm from the nipple. Lesion quadrant: Upper outer quadrant Using sterile technique and 1% Lidocaine as local anesthetic, under direct ultrasound visualization, a 14 gauge spring-loaded device was used to perform biopsy of left breast mass 3 o'clock position 5 cm from the nipple using a lateral approach. At the conclusion of the procedure heart shaped tissue marker clip was deployed into the biopsy cavity. Follow up 2 view mammogram was performed and dictated separately. Site 3: Left axillary lymph node Lesion quadrant: Upper outer quadrant Using sterile technique and 1% Lidocaine as local anesthetic, under direct ultrasound visualization, a 14 gauge spring-loaded device was used to perform biopsy of left axillary lymph node using a lateral approach. At the conclusion of the procedure [facility] tissue marker clip was deployed into the biopsy cavity. Follow up 2 view mammogram was performed and dictated separately. IMPRESSION: Ultrasound guided biopsy of left breast masses and left axillary lymph node. No apparent complications. Electronically Signed: By: Lovey Newcomer M.D. On: 12/24/2020 10:04   [place] RT BREAST BX W LOC DEV 1ST LESION IMG BX SPEC US GUIDE  Addendum Date: 12/29/2020   ADDENDUM REPORT: 12/29/2020 09:32 ADDENDUM: PATHOLOGY revealed: Site A. BREAST, LEFT RETROAREOLAR 3:00; ULTRASOUND-GUIDED BIOPSY: - PREDOMINANTLY KERATINACEOUS DEBRIS. - SINGLE BIOPSY FRAGMENT OF UNREMARKABLE SKIN. - NEGATIVE FOR ATYPIA AND MALIGNANCY. Comment: The histologic findings in part A (left retroareolar biopsy at 3:00) may  represent a benign cutaneous cyst (e.g. inclusion cyst). Correlation with clinical and radiographic findings is required. Pathology results are CONCORDANT with imaging findings, per Dr. Lovey Newcomer. PATHOLOGY revealed: Site B. BREAST, LEFT 3:00 5 CM FN; ULTRASOUND-GUIDED BIOPSY: - INVASIVE MAMMARY CARCINOMA, NO SPECIAL TYPE, WITH PLEOMORPHIC  LOBULAR FEATURES. Size of invasive carcinoma: 11 mm in this sample. Grade 3. Ductal carcinoma in situ: Not identified. Pathology results are CONCORDANT with imaging findings, per Dr. Lovey Newcomer. PATHOLOGY revealed: Site C. LYMPH NODE, LEFT AXILLA; ULTRASOUND-GUIDED BIOPSY: - METASTATIC CARCINOMA MORPHOLOGICALLY CONSISTENT WITH CARCINOMA SAMPLED IN PART B INVOLVING A LYMPH NODE. Pathology results are CONCORDANT with imaging findings, per Dr. Lovey Newcomer. PATHOLOGY revealed: Site D. BREAST, RIGHT 9:00 8 CM FN; ULTRASOUND-GUIDED BIOPSY: - INVASIVE MAMMARY CARCINOMA, NO SPECIAL TYPE, WITH LOBULAR FEATURES. Size of invasive carcinoma: 5 mm in this sample. Grade 2. Ductal carcinoma in situ: Not identified. Lymphovascular invasion: Not identified. Comment: The carcinomas from the left (part B) and right (part D) breast are morphologically dissimilar. The definitive grade will be assigned on the excisional specimens. Pathology results are CONCORDANT with imaging findings, per Dr. Lovey Newcomer. Pathology results and recommendations below were discussed with patient and husband Barbaraann Rondo) via telephone on 12/25/2020. Patient reported biopsy site within normal limits with slight tenderness at the site. Post biopsy care instructions were reviewed, questions were answered and my direct phone number was provided to patient. Patient was instructed to call [facility] if any concerns or questions arise related to the biopsy. RECOMMENDATIONS: 1. Surgical consultation. Request for surgical consultation relayed to Al Pimple RN at [facility] by Electa Sniff RN on 12/25/2020. 2. Consider bilateral breast MRI given patient's heterogeneously dense breast tissue, which may obscure small masses. Pathology results reported by Electa Sniff RN on 12/25/2020. Electronically Signed   By: Lovey Newcomer M.D.   On: 12/29/2020 09:32   Result Date: 12/29/2020 CLINICAL DATA:  Patient with suspicious right breast mass. EXAM:  ULTRASOUND GUIDED RIGHT BREAST CORE NEEDLE BIOPSY COMPARISON:  Previous exam(s). PROCEDURE: I met with the patient and we discussed the procedure of ultrasound-guided biopsy, including benefits and alternatives. We discussed the high likelihood of a successful procedure. We discussed the risks of the procedure, including infection, bleeding, tissue injury, clip migration, and inadequate sampling. Informed written consent was given. The usual time-out protocol was performed immediately prior to the procedure. Lesion quadrant: Upper outer quadrant Using sterile technique and 1% Lidocaine as local anesthetic, under direct ultrasound visualization, a 14 gauge spring-loaded device was used to perform biopsy of right breast mass 9 o'clock position using a lateral approach. At the conclusion of the procedure venous tissue marker clip was deployed into the biopsy cavity. Follow up 2 view mammogram was performed and dictated separately. IMPRESSION: Ultrasound guided biopsy of right breast mass 9 o'clock position. No apparent complications. Electronically Signed: By: Lovey Newcomer M.D. 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  PLAN:    1.  Bilateral breast cancer: Case discussed with pathology and this appears to be 2 distinct breast cancer primaries in her right and left breast both of which are ER/PR, HER2 negative malignancies.  CT scan completed on December 30, 2020 reviewed independently and reported as above did not reveal any metastatic disease.  MUGA scan on January 08, 2021 reported an EF of 57.3%.  Patient has also had port placement.  Previously, surgery recommended upfront systemic therapy. Given patient's age, she will also require genetic  testing.  Plan to give neoadjuvant Adriamycin plus Cytoxan every 2 weeks with Neulasta support for 4 cycles followed by weekly Taxol for 12 cycles.  Patient also may benefit from adjuvant Xeloda.  Proceed with cycle 1 of Adriamycin and  Cytoxan today.  Return to clinic tomorrow for [place], in 1 week for laboratory work and further evaluation, and then in 2 weeks for laboratory work, further evaluation, and consideration of cycle 2. 2.  Nausea: Patient does not complain of this today.  Continue Compazine as needed. 3.  Anxiety/insomnia: Continue Xanax as needed.  I spent a total of 30 minutes reviewing chart data, face-to-face evaluation with the patient, counseling and coordination of care as detailed above.    Patient expressed understanding and was in agreement with this plan. She also understands that She can call clinic at any time with any questions, concerns, or complaints.    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HCC) Staging form: Breast, AJCC 8th Edition - Clinical stage from 01/12/2021: Stage IIB (cT2, cN1, cM0, G3, ER+, PR+, HER2-) - Signed by Lloyd Huger, MD on 01/12/2021 Stage prefix: Initial diagnosis Histologic grading system: 3 grade system  Invasive ductal carcinoma of right breast [facility]) Staging form: Breast, AJCC 8th Edition - Clinical stage from 01/12/2021: Stage IB (cT2, cN0, cM0, G2, ER+, PR+, HER2-) - Signed by Lloyd Huger, MD on 01/12/2021 Stage prefix: Initial diagnosis Histologic grading system: 3 grade system  Lloyd Huger, MD   01/21/2021 10:45 AM

## 2021-01-20 ENCOUNTER — Encounter: Payer: Self-pay | Admitting: Oncology

## 2021-01-21 ENCOUNTER — Other Ambulatory Visit: Payer: Self-pay

## 2021-01-21 ENCOUNTER — Inpatient Hospital Stay (HOSPITAL_BASED_OUTPATIENT_CLINIC_OR_DEPARTMENT_OTHER): Payer: Medicaid Other | Admitting: Oncology

## 2021-01-21 ENCOUNTER — Inpatient Hospital Stay: Payer: Medicaid Other | Attending: Oncology

## 2021-01-21 ENCOUNTER — Inpatient Hospital Stay: Payer: Medicaid Other

## 2021-01-21 VITALS — BP 144/96 | HR 83 | Temp 96.5°F | Resp 16 | Wt 135.6 lb

## 2021-01-21 DIAGNOSIS — G4700 Insomnia, unspecified: Secondary | ICD-10-CM | POA: Diagnosis not present

## 2021-01-21 DIAGNOSIS — Z79899 Other long term (current) drug therapy: Secondary | ICD-10-CM | POA: Diagnosis not present

## 2021-01-21 DIAGNOSIS — F419 Anxiety disorder, unspecified: Secondary | ICD-10-CM | POA: Diagnosis not present

## 2021-01-21 DIAGNOSIS — Z5111 Encounter for antineoplastic chemotherapy: Secondary | ICD-10-CM | POA: Diagnosis not present

## 2021-01-21 DIAGNOSIS — Z5189 Encounter for other specified aftercare: Secondary | ICD-10-CM | POA: Insufficient documentation

## 2021-01-21 DIAGNOSIS — T451X5A Adverse effect of antineoplastic and immunosuppressive drugs, initial encounter: Secondary | ICD-10-CM | POA: Insufficient documentation

## 2021-01-21 DIAGNOSIS — C50911 Malignant neoplasm of unspecified site of right female breast: Secondary | ICD-10-CM

## 2021-01-21 DIAGNOSIS — F17210 Nicotine dependence, cigarettes, uncomplicated: Secondary | ICD-10-CM | POA: Diagnosis not present

## 2021-01-21 DIAGNOSIS — C50811 Malignant neoplasm of overlapping sites of right female breast: Secondary | ICD-10-CM | POA: Diagnosis present

## 2021-01-21 DIAGNOSIS — C50912 Malignant neoplasm of unspecified site of left female breast: Secondary | ICD-10-CM

## 2021-01-21 DIAGNOSIS — D649 Anemia, unspecified: Secondary | ICD-10-CM | POA: Insufficient documentation

## 2021-01-21 DIAGNOSIS — C50812 Malignant neoplasm of overlapping sites of left female breast: Secondary | ICD-10-CM | POA: Insufficient documentation

## 2021-01-21 DIAGNOSIS — D702 Other drug-induced agranulocytosis: Secondary | ICD-10-CM | POA: Diagnosis not present

## 2021-01-21 LAB — CBC WITH DIFFERENTIAL/PLATELET
Abs Immature Granulocytes: 0.01 10*3/uL (ref 0.00–0.07)
Basophils Absolute: 0 10*3/uL (ref 0.0–0.1)
Basophils Relative: 0 %
Eosinophils Absolute: 0.1 10*3/uL (ref 0.0–0.5)
Eosinophils Relative: 2 %
HCT: 37.1 % (ref 36.0–46.0)
Hemoglobin: 12 g/dL (ref 12.0–15.0)
Immature Granulocytes: 0 %
Lymphocytes Relative: 31 %
Lymphs Abs: 1.7 10*3/uL (ref 0.7–4.0)
MCH: 29.6 pg (ref 26.0–34.0)
MCHC: 32.3 g/dL (ref 30.0–36.0)
MCV: 91.4 fL (ref 80.0–100.0)
Monocytes Absolute: 0.7 10*3/uL (ref 0.1–1.0)
Monocytes Relative: 13 %
Neutro Abs: 2.8 10*3/uL (ref 1.7–7.7)
Neutrophils Relative %: 54 %
Platelets: 301 10*3/uL (ref 150–400)
RBC: 4.06 MIL/uL (ref 3.87–5.11)
RDW: 17.1 % — ABNORMAL HIGH (ref 11.5–15.5)
WBC: 5.4 10*3/uL (ref 4.0–10.5)
nRBC: 0 % (ref 0.0–0.2)

## 2021-01-21 LAB — COMPREHENSIVE METABOLIC PANEL
ALT: 14 U/L (ref 0–44)
AST: 19 U/L (ref 15–41)
Albumin: 3.9 g/dL (ref 3.5–5.0)
Alkaline Phosphatase: 60 U/L (ref 38–126)
Anion gap: 10 (ref 5–15)
BUN: 8 mg/dL (ref 6–20)
CO2: 25 mmol/L (ref 22–32)
Calcium: 9 mg/dL (ref 8.9–10.3)
Chloride: 101 mmol/L (ref 98–111)
Creatinine, Ser: 0.74 mg/dL (ref 0.44–1.00)
GFR, Estimated: >60 mL/min (ref >60)
Glucose, Bld: 106 mg/dL — ABNORMAL HIGH (ref 70–99)
Potassium: 4 mmol/L (ref 3.5–5.1)
Sodium: 136 mmol/L (ref 135–145)
Total Bilirubin: 0.4 mg/dL (ref 0.3–1.2)
Total Protein: 7.4 g/dL (ref 6.5–8.1)

## 2021-01-21 MED ORDER — SODIUM CHLORIDE 0.9% FLUSH
10.0000 mL | INTRAVENOUS | Status: DC | PRN
  Filled 2021-01-21: qty 10

## 2021-01-21 MED ORDER — LABETALOL HCL 100 MG PO TABS: 100.0000 mg | ORAL_TABLET | Freq: Two times a day (BID) | ORAL | 1 refills | Status: DC

## 2021-01-21 MED ORDER — HEPARIN SOD (PORK) LOCK FLUSH 100 UNIT/ML IV SOLN
INTRAVENOUS | Status: AC
  Filled 2021-01-21: qty 5

## 2021-01-21 MED ORDER — SODIUM CHLORIDE 0.9 % IV SOLN
600.0000 mg/m2 | Freq: Once | INTRAVENOUS | Status: AC
  Administered 2021-01-21: 1000 mg via INTRAVENOUS
  Filled 2021-01-21: qty 50

## 2021-01-21 MED ORDER — SODIUM CHLORIDE 0.9 % IV SOLN
Freq: Once | INTRAVENOUS | Status: AC
  Filled 2021-01-21: qty 250

## 2021-01-21 MED ORDER — SODIUM CHLORIDE 0.9 % IV SOLN
150.0000 mg | Freq: Once | INTRAVENOUS | Status: AC
  Administered 2021-01-21: 150 mg via INTRAVENOUS
  Filled 2021-01-21: qty 5

## 2021-01-21 MED ORDER — DOXORUBICIN HCL CHEMO IV INJECTION 2 MG/ML
60.0000 mg/m2 | Freq: Once | INTRAVENOUS | Status: AC
  Administered 2021-01-21: 100 mg via INTRAVENOUS
  Filled 2021-01-21: qty 50

## 2021-01-21 MED ORDER — SODIUM CHLORIDE 0.9 % IV SOLN
10.0000 mg | Freq: Once | INTRAVENOUS | Status: AC
  Administered 2021-01-21: 10 mg via INTRAVENOUS
  Filled 2021-01-21: qty 10

## 2021-01-21 MED ORDER — ONDANSETRON HCL 8 MG PO TABS
8.0000 mg | ORAL_TABLET | Freq: Two times a day (BID) | ORAL | 2 refills | Status: DC
Start: 2021-01-21 — End: 2021-07-06

## 2021-01-21 MED ORDER — PALONOSETRON HCL INJECTION 0.25 MG/5ML
0.2500 mg | Freq: Once | INTRAVENOUS | Status: AC
  Administered 2021-01-21: 0.25 mg via INTRAVENOUS
  Filled 2021-01-21: qty 5

## 2021-01-21 MED ORDER — HEPARIN SOD (PORK) LOCK FLUSH 100 UNIT/ML IV SOLN
500.0000 [IU] | Freq: Once | INTRAVENOUS | Status: AC | PRN
  Administered 2021-01-21: 500 [IU]
  Filled 2021-01-21: qty 5

## 2021-01-21 NOTE — Progress Notes (Signed)
Pt tolerated all infusions well today with no problems or complaints.  Pt left infusion suite stable and ambulatory to return tomorrow for her neulasta injection.

## 2021-01-21 NOTE — Progress Notes (Signed)
Pt has no new concerns for todays visit. Will like to know if Dr. Grayland Ormond will refill her BP medication so she will not have to go to ER to get medication since she does not have a PCP.

## 2021-01-21 NOTE — Patient Instructions (Signed)
[facility] [place]  Discharge Instructions: Thank you for choosing [facility] to provide your oncology and hematology care.   If you have a lab appointment with the [facility], please go directly to the [facility] and check in at the registration area.   Wear comfortable clothing and clothing appropriate for easy access to any Portacath or PICC line.   We strive to give you quality time with your provider. You may need to reschedule your appointment if you arrive late (15 or more minutes).  Arriving late affects you and other patients whose appointments are after yours.  Also, if you miss three or more appointments without notifying the office, you may be dismissed from the clinic at the provider's discretion.      For prescription refill requests, have your pharmacy contact our office and allow 72 hours for refills to be completed.    Today you received the following chemotherapy and/or immunotherapy agents adriamycin, cytoxan      To help prevent nausea and vomiting after your treatment, we encourage you to take your nausea medication as directed.  BELOW ARE SYMPTOMS THAT SHOULD BE REPORTED IMMEDIATELY: *FEVER GREATER THAN 100.4 F (38 C) OR HIGHER *CHILLS OR SWEATING *NAUSEA AND VOMITING THAT IS NOT CONTROLLED WITH YOUR NAUSEA MEDICATION *UNUSUAL SHORTNESS OF BREATH *UNUSUAL BRUISING OR BLEEDING *URINARY PROBLEMS (pain or burning when urinating, or frequent urination) *BOWEL PROBLEMS (unusual diarrhea, constipation, pain near the anus) TENDERNESS IN MOUTH AND THROAT WITH OR WITHOUT PRESENCE OF ULCERS (sore throat, sores in mouth, or a toothache) UNUSUAL RASH, SWELLING OR PAIN  UNUSUAL VAGINAL DISCHARGE OR ITCHING   Items with * indicate a potential emergency and should be followed up as soon as possible or go to the Emergency Department if any problems should occur.  Please show the CHEMOTHERAPY ALERT CARD or IMMUNOTHERAPY ALERT CARD at  check-in to the Emergency Department and triage nurse.  Should you have questions after your visit or need to cancel or reschedule your appointment, please contact [facility] [place] [facility]  Dept: [phone]  and follow the prompts.  Office hours are 8:00 a.m. to 4:30 p.m. Monday - Friday. Please note that voicemails left after 4:00 p.m. may not be returned until the following business day.  We are closed weekends and major holidays. You have access to a nurse at all times for urgent questions. Please call the main number to the clinic Dept: [phone] and follow the prompts.   For any non-urgent questions, you may also contact your provider using MyChart. We now offer e-Visits for anyone 77 and older to request care online for non-urgent symptoms. For details visit [email or web address].   Also download the MyChart app! Go to the app store, search "MyChart", open the app, select [facility], and log in with your MyChart username and password.  Due to Covid, a mask is required upon entering the hospital/clinic. If you do not have a mask, one will be given to you upon arrival. For doctor visits, patients may have 1 support person aged 78 or older with them. For treatment visits, patients cannot have anyone with them due to current Covid guidelines and our immunocompromised population.   Cyclophosphamide Injection What is this medication? CYCLOPHOSPHAMIDE (sye kloe FOSS fa mide) is a chemotherapy drug. It slows the growth of cancer cells. This medicine is used to treat many types of cancer like lymphoma, myeloma, leukemia, breast cancer, and ovarian cancer, to name a few. This medicine  may be used for other purposes; ask your health care provider or pharmacist if you have questions. COMMON BRAND NAME(S): Cytoxan, Neosar What should I tell my care team before I take this medication? They need to know if you have any of these conditions: heart disease history of irregular  heartbeat infection kidney disease liver disease low blood counts, like white cells, platelets, or red blood cells on hemodialysis recent or ongoing radiation therapy scarring or thickening of the lungs trouble passing urine an unusual or allergic reaction to cyclophosphamide, other medicines, foods, dyes, or preservatives pregnant or trying to get pregnant breast-feeding How should I use this medication? This drug is usually given as an injection into a vein or muscle or by infusion into a vein. It is administered in a hospital or clinic by a specially trained health care professional. Talk to your pediatrician regarding the use of this medicine in children. Special care may be needed. Overdosage: If you think you have taken too much of this medicine contact a poison control center or emergency room at once. NOTE: This medicine is only for you. Do not share this medicine with others. What if I miss a dose? It is important not to miss your dose. Call your doctor or health care professional if you are unable to keep an appointment. What may interact with this medication? amphotericin B azathioprine certain antivirals for HIV or hepatitis certain medicines for blood pressure, heart disease, irregular heart beat certain medicines that treat or prevent blood clots like warfarin certain other medicines for cancer cyclosporine etanercept indomethacin medicines that relax muscles for surgery medicines to increase blood counts metronidazole This list may not describe all possible interactions. Give your health care provider a list of all the medicines, herbs, non-prescription drugs, or dietary supplements you use. Also tell them if you smoke, drink alcohol, or use illegal drugs. Some items may interact with your medicine. What should I watch for while using this medication? Your condition will be monitored carefully while you are receiving this medicine. You may need blood work done while  you are taking this medicine. Drink water or other fluids as directed. Urinate often, even at night. Some products may contain alcohol. Ask your health care professional if this medicine contains alcohol. Be sure to tell all health care professionals you are taking this medicine. Certain medicines, like metronidazole and disulfiram, can cause an unpleasant reaction when taken with alcohol. The reaction includes flushing, headache, nausea, vomiting, sweating, and increased thirst. The reaction can last from 30 minutes to several hours. Do not become pregnant while taking this medicine or for 1 year after stopping it. Women should inform their health care professional if they wish to become pregnant or think they might be pregnant. Men should not father a child while taking this medicine and for 4 months after stopping it. There is potential for serious side effects to an unborn child. Talk to your health care professional for more information. Do not breast-feed an infant while taking this medicine or for 1 week after stopping it. This medicine has caused ovarian failure in some women. This medicine may make it more difficult to get pregnant. Talk to your health care professional if you are concerned about your fertility. This medicine has caused decreased sperm counts in some men. This may make it more difficult to father a child. Talk to your health care professional if you are concerned about your fertility. Call your health care professional for advice if you get a  fever, chills, or sore throat, or other symptoms of a cold or flu. Do not treat yourself. This medicine decreases your body's ability to fight infections. Try to avoid being around people who are sick. Avoid taking medicines that contain aspirin, acetaminophen, ibuprofen, naproxen, or ketoprofen unless instructed by your health care professional. These medicines may hide a fever. Talk to your health care professional about your risk of cancer.  You may be more at risk for certain types of cancer if you take this medicine. If you are going to need surgery or other procedure, tell your health care professional that you are using this medicine. Be careful brushing or flossing your teeth or using a toothpick because you may get an infection or bleed more easily. If you have any dental work done, tell your dentist you are receiving this medicine. What side effects may I notice from receiving this medication? Side effects that you should report to your doctor or health care professional as soon as possible: allergic reactions like skin rash, itching or hives, swelling of the face, lips, or tongue breathing problems nausea, vomiting signs and symptoms of bleeding such as bloody or black, tarry stools; red or dark brown urine; spitting up blood or brown material that looks like coffee grounds; red spots on the skin; unusual bruising or bleeding from the eyes, gums, or nose signs and symptoms of heart failure like fast, irregular heartbeat, sudden weight gain; swelling of the ankles, feet, hands signs and symptoms of infection like fever; chills; cough; sore throat; pain or trouble passing urine signs and symptoms of kidney injury like trouble passing urine or change in the amount of urine signs and symptoms of liver injury like dark yellow or brown urine; general ill feeling or flu-like symptoms; light-colored stools; loss of appetite; nausea; right upper belly pain; unusually weak or tired; yellowing of the eyes or skin Side effects that usually do not require medical attention (report to your doctor or health care professional if they continue or are bothersome): confusion decreased hearing diarrhea facial flushing hair loss headache loss of appetite missed menstrual periods signs and symptoms of low red blood cells or anemia such as unusually weak or tired; feeling faint or lightheaded; falls skin discoloration This list may not describe  all possible side effects. Call your doctor for medical advice about side effects. You may report side effects to FDA at 1-800-FDA-1088. Where should I keep my medication? This drug is given in a hospital or clinic and will not be stored at home. NOTE: This sheet is a summary. It may not cover all possible information. If you have questions about this medicine, talk to your doctor, pharmacist, or health care provider.  2022 Elsevier/Gold Standard (2020-10-27 00:00:00)   Doxorubicin injection What is this medication? DOXORUBICIN (dox oh ROO bi sin) is a chemotherapy drug. It is used to treat many kinds of cancer like leukemia, lymphoma, neuroblastoma, sarcoma, and Wilms' tumor. It is also used to treat bladder cancer, breast cancer, lung cancer, ovarian cancer, stomach cancer, and thyroid cancer. This medicine may be used for other purposes; ask your health care provider or pharmacist if you have questions. COMMON BRAND NAME(S): Adriamycin, Adriamycin PFS, Adriamycin RDF, Rubex What should I tell my care team before I take this medication? They need to know if you have any of these conditions: heart disease history of low blood counts caused by a medicine liver disease recent or ongoing radiation therapy an unusual or allergic reaction to doxorubicin, other  chemotherapy agents, other medicines, foods, dyes, or preservatives pregnant or trying to get pregnant breast-feeding How should I use this medication? This drug is given as an infusion into a vein. It is administered in a hospital or clinic by a specially trained health care professional. If you have pain, swelling, burning or any unusual feeling around the site of your injection, tell your health care professional right away. Talk to your pediatrician regarding the use of this medicine in children. Special care may be needed. Overdosage: If you think you have taken too much of this medicine contact a poison control center or emergency  room at once. NOTE: This medicine is only for you. Do not share this medicine with others. What if I miss a dose? It is important not to miss your dose. Call your doctor or health care professional if you are unable to keep an appointment. What may interact with this medication? This medicine may interact with the following medications: 6-mercaptopurine paclitaxel phenytoin St. John's Wort trastuzumab verapamil This list may not describe all possible interactions. Give your health care provider a list of all the medicines, herbs, non-prescription drugs, or dietary supplements you use. Also tell them if you smoke, drink alcohol, or use illegal drugs. Some items may interact with your medicine. What should I watch for while using this medication? This drug may make you feel generally unwell. This is not uncommon, as chemotherapy can affect healthy cells as well as cancer cells. Report any side effects. Continue your course of treatment even though you feel ill unless your doctor tells you to stop. There is a maximum amount of this medicine you should receive throughout your life. The amount depends on the medical condition being treated and your overall health. Your doctor will watch how much of this medicine you receive in your lifetime. Tell your doctor if you have taken this medicine before. You may need blood work done while you are taking this medicine. Your urine may turn red for a few days after your dose. This is not blood. If your urine is dark or brown, call your doctor. In some cases, you may be given additional medicines to help with side effects. Follow all directions for their use. Call your doctor or health care professional for advice if you get a fever, chills or sore throat, or other symptoms of a cold or flu. Do not treat yourself. This drug decreases your body's ability to fight infections. Try to avoid being around people who are sick. This medicine may increase your risk to  bruise or bleed. Call your doctor or health care professional if you notice any unusual bleeding. Talk to your doctor about your risk of cancer. You may be more at risk for certain types of cancers if you take this medicine. Do not become pregnant while taking this medicine or for 6 months after stopping it. Women should inform their doctor if they wish to become pregnant or think they might be pregnant. Men should not father a child while taking this medicine and for 6 months after stopping it. There is a potential for serious side effects to an unborn child. Talk to your health care professional or pharmacist for more information. Do not breast-feed an infant while taking this medicine. This medicine has caused ovarian failure in some women and reduced sperm counts in some men This medicine may interfere with the ability to have a child. Talk with your doctor or health care professional if you are concerned about  your fertility. This medicine may cause a decrease in Co-Enzyme Q-10. You should make sure that you get enough Co-Enzyme Q-10 while you are taking this medicine. Discuss the foods you eat and the vitamins you take with your health care professional. What side effects may I notice from receiving this medication? Side effects that you should report to your doctor or health care professional as soon as possible: allergic reactions like skin rash, itching or hives, swelling of the face, lips, or tongue breathing problems chest pain fast or irregular heartbeat low blood counts - this medicine may decrease the number of white blood cells, red blood cells and platelets. You may be at increased risk for infections and bleeding. pain, redness, or irritation at site where injected signs of infection - fever or chills, cough, sore throat, pain or difficulty passing urine signs of decreased platelets or bleeding - bruising, pinpoint red spots on the skin, black, tarry stools, blood in the  urine swelling of the ankles, feet, hands tiredness weakness Side effects that usually do not require medical attention (report to your doctor or health care professional if they continue or are bothersome): diarrhea hair loss mouth sores nail discoloration or damage nausea red colored urine vomiting This list may not describe all possible side effects. Call your doctor for medical advice about side effects. You may report side effects to FDA at 1-800-FDA-1088. Where should I keep my medication? This drug is given in a hospital or clinic and will not be stored at home. NOTE: This sheet is a summary. It may not cover all possible information. If you have questions about this medicine, talk to your doctor, pharmacist, or health care provider.  2022 Elsevier/Gold Standard (2016-10-13 00:00:00)

## 2021-01-22 ENCOUNTER — Encounter: Payer: Self-pay | Admitting: Oncology

## 2021-01-22 ENCOUNTER — Telehealth: Payer: Self-pay

## 2021-01-22 ENCOUNTER — Inpatient Hospital Stay: Payer: Medicaid Other | Attending: Oncology

## 2021-01-22 DIAGNOSIS — F419 Anxiety disorder, unspecified: Secondary | ICD-10-CM | POA: Insufficient documentation

## 2021-01-22 DIAGNOSIS — Z79899 Other long term (current) drug therapy: Secondary | ICD-10-CM | POA: Insufficient documentation

## 2021-01-22 DIAGNOSIS — C50811 Malignant neoplasm of overlapping sites of right female breast: Secondary | ICD-10-CM | POA: Diagnosis not present

## 2021-01-22 DIAGNOSIS — Z5189 Encounter for other specified aftercare: Secondary | ICD-10-CM | POA: Diagnosis present

## 2021-01-22 DIAGNOSIS — C50912 Malignant neoplasm of unspecified site of left female breast: Secondary | ICD-10-CM

## 2021-01-22 DIAGNOSIS — G4700 Insomnia, unspecified: Secondary | ICD-10-CM | POA: Insufficient documentation

## 2021-01-22 DIAGNOSIS — C50911 Malignant neoplasm of unspecified site of right female breast: Secondary | ICD-10-CM

## 2021-01-22 DIAGNOSIS — F17210 Nicotine dependence, cigarettes, uncomplicated: Secondary | ICD-10-CM | POA: Diagnosis not present

## 2021-01-22 DIAGNOSIS — R110 Nausea: Secondary | ICD-10-CM | POA: Insufficient documentation

## 2021-01-22 DIAGNOSIS — C50812 Malignant neoplasm of overlapping sites of left female breast: Secondary | ICD-10-CM | POA: Diagnosis present

## 2021-01-22 MED ORDER — PEGFILGRASTIM-CBQV 6 MG/0.6ML ~~LOC~~ SOSY
6.0000 mg | PREFILLED_SYRINGE | Freq: Once | SUBCUTANEOUS | Status: AC
  Administered 2021-01-22: 6 mg via SUBCUTANEOUS
  Filled 2021-01-22: qty 0.6

## 2021-01-22 NOTE — Progress Notes (Signed)
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  INTERVAL HISTORY: Patient returns to clinic today for further evaluation and to assess her toleration of cycle 1 of Adriamycin and Cytoxan.  She tolerated her treatment well without significant side effects.  She currently feels well and is asymptomatic. She has no neurologic complaints.  She denies any recent fevers or illnesses.  She has a fair appetite, but denies weight loss.  She has no chest pain, shortness of breath, cough, or hemoptysis.  She denies any nausea, vomiting, constipation, or diarrhea.  She has no urinary complaints.  Patient offers no specific complaints today.  REVIEW OF SYSTEMS:   Review of Systems  Constitutional: Negative.  Negative for chills, fever and malaise/fatigue.  Respiratory: Negative.  Negative for cough, hemoptysis and shortness of breath.   Cardiovascular: Negative.  Negative for chest pain and leg swelling.  Gastrointestinal: Negative.  Negative for abdominal pain and nausea.  Genitourinary: Negative.  Negative for dysuria.  Musculoskeletal: Negative.  Negative for back pain.  Skin: Negative.  Negative for rash.  Neurological: Negative.  Negative for dizziness, focal weakness, weakness and headaches.  Psychiatric/Behavioral: Negative.  The patient is not nervous/anxious and does not have insomnia.    As per HPI. Otherwise, a complete review of systems is negative.  PAST MEDICAL HISTORY: Past Medical History:  Diagnosis Date   Anxiety    Headache    Hypertension    Noncompliance     PAST SURGICAL HISTORY: Past Surgical History:  Procedure Laterality Date   BREAST BIOPSY  Right 12/24/2020   mass, 9:00 8 cmfn venus marker, path pending   BREAST BIOPSY Left 12/24/2020   mass 3:00 retroareolar, ribbon, path pending   BREAST BIOPSY Left 12/24/2020   mass 3:00 5cmfn, heart marker, path pending   BREAST BIOPSY Left 12/24/2020   axilla LN, hyrdomarker shape 3 coil, path pending   PORTACATH PLACEMENT Right 01/01/2021   Procedure: INSERTION PORT-A-CATH;  Surgeon: Ronny Bacon, MD;  Location: [facility] ORS;  Service: General;  Laterality: Right;   TUBAL LIGATION      FAMILY HISTORY: Family History  Problem Relation Age of Onset   COPD Mother    Hypertension Mother    Heart disease Father     ADVANCED DIRECTIVES (Y/N):  N  HEALTH MAINTENANCE: Social History   Tobacco Use   Smoking status: Every Day    Packs/day: 1.50    Types: Cigarettes   Smokeless tobacco: Never  Vaping Use   Vaping Use: Never used  Substance Use Topics   Alcohol use: Yes    Alcohol/week: 14.0 standard drinks    Types: 14 Cans of beer per week    Comment: at least 2 beers daily   Drug use: No     Colonoscopy:  PAP:  Bone density:  Lipid panel:  Allergies  Allergen Reactions   Codeine Itching and Nausea And Vomiting    Current Outpatient Medications  Medication Sig Dispense Refill   acetaminophen (TYLENOL) 325 MG tablet Take 650 mg by mouth every 6 (six) hours as needed.     ALPRAZolam (XANAX) 0.25 MG tablet Take 1 tablet (0.25 mg total) by mouth 2 (two) times daily as needed for anxiety.  60 tablet 0   diphenhydrAMINE HCl (BENADRYL ALLERGY PO) Take by mouth.     labetalol (NORMODYNE) 100 MG tablet Take 1 tablet (100 mg total) by mouth 2 (two) times daily. 60 tablet 1   lidocaine-prilocaine (EMLA) cream Apply to affected area once 30 g 3   loratadine (CLARITIN) 10 MG tablet Take 10 mg by mouth daily.     ondansetron (ZOFRAN) 8 MG tablet Take 1 tablet (8 mg total) by mouth 2 (two) times daily. 60 tablet 2   prochlorperazine (COMPAZINE) 10 MG tablet Take 1 tablet (10 mg  total) by mouth every 6 (six) hours as needed for nausea or vomiting. 60 tablet 2   bacitracin ointment Apply 1 application topically 2 (two) times daily. 120 g 0   diphenhydrAMINE-zinc acetate (BENADRYL EXTRA STRENGTH) cream Apply 1 application topically 3 (three) times daily as needed for itching. (Patient not taking: Reported on 01/21/2021) 28.4 g 0   ibuprofen (ADVIL) 800 MG tablet Take 1 tablet (800 mg total) by mouth every 8 (eight) hours as needed. (Patient not taking: Reported on 01/28/2021) 30 tablet 0   No current facility-administered medications for this visit.    OBJECTIVE: Vitals:   01/28/21 0934  BP: (!) 162/125  Pulse: 93  Resp: 16  Temp: (!) 96.8 F (36 C)     Body mass index is 22.49 kg/m.    ECOG FS:0 - Asymptomatic  General: Well-developed, well-nourished, no acute distress. Eyes: Pink conjunctiva, anicteric sclera. HEENT: Normocephalic, moist mucous membranes. Lungs: No audible wheezing or coughing. Heart: Regular rate and rhythm. Abdomen: Soft, nontender, no obvious distention. Musculoskeletal: No edema, cyanosis, or clubbing. Neuro: Alert, answering all questions appropriately. Cranial nerves grossly intact. Skin: No rashes or petechiae noted. Psych: Normal affect.  LAB RESULTS:  Lab Results  Component Value Date   NA 135 01/28/2021   K 3.8 01/28/2021   CL 96 (L) 01/28/2021   CO2 27 01/28/2021   GLUCOSE 104 (H) 01/28/2021   BUN 6 01/28/2021   CREATININE 0.71 01/28/2021   CALCIUM 10.0 01/28/2021   PROT 8.6 (H) 01/28/2021   ALBUMIN 4.2 01/28/2021   AST 14 (L) 01/28/2021   ALT 10 01/28/2021   ALKPHOS 112 01/28/2021   BILITOT 0.4 01/28/2021   GFRNONAA >60 01/28/2021   GFRAA >60 12/01/2017    Lab Results  Component Value Date   WBC 2.4 (L) 01/28/2021   NEUTROABS 0.7 (L) 01/28/2021   HGB 13.0 01/28/2021   HCT 39.6 01/28/2021   MCV 89.8 01/28/2021   PLT 176 01/28/2021     STUDIES: NM Cardiac Muga Rest  Result Date: 01/08/2021 CLINICAL  DATA:  Breast cancer. Evaluate cardiac function in relation to chemotherapy. EXAM: NUCLEAR MEDICINE CARDIAC BLOOD POOL IMAGING (MUGA) TECHNIQUE: Cardiac multi-gated acquisition was performed at rest following intravenous injection of Tc-38mlabeled red blood cells. RADIOPHARMACEUTICALS:  20.2 mCi Tc-931mertechnetate in-vitro labeled red blood cells IV COMPARISON:  None. FINDINGS: No  focal wall motion abnormality of the left ventricle. Calculated left ventricular ejection fraction equals 57.3 IMPRESSION: Left ventricular ejection fraction equals57.3 %. Electronically Signed   By: StSuzy Bouchard.D.   On: 01/08/2021 16:13   CT CHEST ABDOMEN PELVIS W CONTRAST  Result Date: 12/30/2020 CLINICAL DATA:  Bilateral breast cancer, current smoker. EXAM: CT CHEST, ABDOMEN, AND PELVIS WITH CONTRAST TECHNIQUE: Multidetector CT imaging of the chest, abdomen and pelvis was performed following the standard protocol during bolus administration of intravenous contrast. CONTRAST:  10070mMNIPAQUE IOHEXOL 300 MG/ML  SOLN  COMPARISON:  CT chest 05/22/2020. FINDINGS: CT CHEST FINDINGS Cardiovascular: Heart size normal. Left ventricle appears hypertrophied. No pericardial effusion. Mediastinum/Nodes: No pathologically enlarged mediastinal, hilar, internal mammary or right axillary lymph nodes. Left axillary adenopathy measures up to 1.4 cm. Esophagus is grossly unremarkable. Lungs/Pleura: No suspicious pulmonary nodules. No pleural fluid. Airway is unremarkable. Musculoskeletal: Heterogeneous mass in the lateral left breast measures 3.4 x 5.4 cm. Comparison with the prior exam is difficult due to incomplete visualization on that study. A more cystic appearing subcutaneous mass deep to the skin surface in the left periareolar region measures 2.5 x 3.4 cm, unchanged. Overlying skin thickening. No worrisome lytic or sclerotic lesions. CT ABDOMEN PELVIS FINDINGS Hepatobiliary: Liver is slightly decreased in attenuation diffusely. Liver  and gallbladder are otherwise unremarkable. No biliary ductal dilatation. Pancreas: Negative. Spleen: Negative. Adrenals/Urinary Tract: Adrenal glands and right kidney are unremarkable. Scarring in the left kidney. Ureters are decompressed. Bladder is unremarkable. Stomach/Bowel: Stomach, small bowel, appendix and colon are unremarkable. Vascular/Lymphatic: Atherosclerotic calcification of the aorta. No pathologically enlarged lymph nodes. Reproductive: Uterus is visualized. Fluid in the endometrial canal. There may be cervical prominence. No adnexal mass. Other: No free fluid.  Mesenteries and peritoneum are unremarkable. Musculoskeletal: No worrisome lytic or sclerotic lesions. IMPRESSION: 1. Complex left breast mass(es) with left axillary adenopathy, findings consistent with breast cancer. No evidence of distant metastatic disease. 2. Hepatic steatosis. 3. Fluid in endometrial canal. Possible cervical prominence. Consider pelvic ultrasound in further evaluation, as clinically indicated. 4.  Aortic atherosclerosis (ICD10-I70.0). Electronically Signed   By: Lorin Picket M.D.   On: 12/30/2020 15:53   DG Chest Port 1 View  Result Date: 01/01/2021 CLINICAL DATA:  Port-A-Cath insertion EXAM: PORTABLE CHEST 1 VIEW COMPARISON:  Chest CT 11/01/2020 FINDINGS: Right Port-A-Cath tip at superior caval/atrial junction. Midline trachea. Borderline cardiomegaly. Tortuous thoracic aorta. No pleural effusion or pneumothorax. Clear lungs. Linear density projects over the lower right chest, new since the prior. Numerous leads and wires project over the chest. IMPRESSION: Right Port-A-Cath tip at superior caval/right atrial junction, without pneumothorax. Linear density projecting over the lower right chest, of indeterminate etiology. Electronically Signed   By: Abigail Miyamoto M.D.   On: 01/01/2021 14:03   DG C-Arm 1-60 Min-No Report  Result Date: 01/01/2021 Fluoroscopy was utilized by the requesting physician.  No  radiographic interpretation.    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  PLAN:    1.  Bilateral breast cancer: Case discussed with pathology and this appears to be 2 distinct breast cancer primaries in her right and left breast both of which are ER/PR, HER2 negative malignancies.  CT scan completed on December 30, 2020 reviewed independently and reported as above did not reveal any metastatic disease.  MUGA scan on January 08, 2021 reported an EF of 57.3%.  Patient has also had port placement.  Previously, surgery recommended upfront systemic therapy. Given patient's age, she will also require genetic testing.  Plan to give neoadjuvant Adriamycin plus Cytoxan every 2 weeks with Neulasta support for 4 cycles followed by weekly Taxol for 12 cycles.  Patient also may benefit from adjuvant Xeloda.  Patient tolerated cycle 1 of Adriamycin and Cytoxan last week without significant side effects.  Return to clinic in 1 week as previously scheduled for consideration of cycle 2.   2.  Nausea: Patient does not complain of this today.  Continue Compazine as needed. 3.  Anxiety/insomnia: Continue Xanax as needed. 4.  Leukopenia: Mild, secondary to chemotherapy.  Monitor.  Udenyca as above.   Patient expressed understanding and was in agreement with this plan. She also understands that She can call clinic at any time with any questions, concerns, or complaints.    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HCC) Staging form: Breast, AJCC 8th Edition - Clinical stage from 01/12/2021: Stage IIB (cT2, cN1, cM0, G3, ER+, PR+, HER2-) - Signed by Lloyd Huger, MD on 01/12/2021 Stage prefix: Initial diagnosis Histologic grading system: 3 grade system  Invasive ductal carcinoma of right breast [facility]) Staging form: Breast, AJCC 8th Edition - Clinical stage from 01/12/2021: Stage IB (cT2, cN0, cM0, G2, ER+, PR+, HER2-) - Signed by Lloyd Huger, MD on 01/12/2021 Stage prefix:  Initial diagnosis Histologic grading system: 3 grade system  Lloyd Huger, MD   01/28/2021 1:33 PM

## 2021-01-22 NOTE — Telephone Encounter (Signed)
Telephone call to patient for follow up after receiving first infusion.   Patient husband states infusion went great.  States eating good and drinking plenty of fluids.   Denies any nausea or vomiting.  Encouraged patient to call for any concerns or questions.  

## 2021-01-28 ENCOUNTER — Inpatient Hospital Stay (HOSPITAL_BASED_OUTPATIENT_CLINIC_OR_DEPARTMENT_OTHER): Payer: Medicaid Other | Admitting: Oncology

## 2021-01-28 ENCOUNTER — Encounter: Payer: Self-pay | Admitting: Oncology

## 2021-01-28 ENCOUNTER — Inpatient Hospital Stay: Payer: Medicaid Other

## 2021-01-28 ENCOUNTER — Other Ambulatory Visit: Payer: Self-pay

## 2021-01-28 ENCOUNTER — Encounter: Payer: Self-pay | Admitting: Licensed Clinical Social Worker

## 2021-01-28 ENCOUNTER — Inpatient Hospital Stay (HOSPITAL_BASED_OUTPATIENT_CLINIC_OR_DEPARTMENT_OTHER): Payer: Medicaid Other | Admitting: Licensed Clinical Social Worker

## 2021-01-28 VITALS — BP 162/125 | HR 93 | Temp 96.8°F | Resp 16 | Wt 131.0 lb

## 2021-01-28 DIAGNOSIS — C50912 Malignant neoplasm of unspecified site of left female breast: Secondary | ICD-10-CM

## 2021-01-28 DIAGNOSIS — C50911 Malignant neoplasm of unspecified site of right female breast: Secondary | ICD-10-CM

## 2021-01-28 DIAGNOSIS — Z5111 Encounter for antineoplastic chemotherapy: Secondary | ICD-10-CM | POA: Diagnosis not present

## 2021-01-28 LAB — COMPREHENSIVE METABOLIC PANEL
ALT: 10 U/L (ref 0–44)
AST: 14 U/L — ABNORMAL LOW (ref 15–41)
Albumin: 4.2 g/dL (ref 3.5–5.0)
Alkaline Phosphatase: 112 U/L (ref 38–126)
Anion gap: 12 (ref 5–15)
BUN: 6 mg/dL (ref 6–20)
CO2: 27 mmol/L (ref 22–32)
Calcium: 10 mg/dL (ref 8.9–10.3)
Chloride: 96 mmol/L — ABNORMAL LOW (ref 98–111)
Creatinine, Ser: 0.71 mg/dL (ref 0.44–1.00)
GFR, Estimated: >60 mL/min (ref >60)
Glucose, Bld: 104 mg/dL — ABNORMAL HIGH (ref 70–99)
Potassium: 3.8 mmol/L (ref 3.5–5.1)
Sodium: 135 mmol/L (ref 135–145)
Total Bilirubin: 0.4 mg/dL (ref 0.3–1.2)
Total Protein: 8.6 g/dL — ABNORMAL HIGH (ref 6.5–8.1)

## 2021-01-28 LAB — CBC WITH DIFFERENTIAL/PLATELET
Abs Immature Granulocytes: 0.15 10*3/uL — ABNORMAL HIGH (ref 0.00–0.07)
Basophils Absolute: 0.1 10*3/uL (ref 0.0–0.1)
Basophils Relative: 2 %
Eosinophils Absolute: 0.1 10*3/uL (ref 0.0–0.5)
Eosinophils Relative: 5 %
HCT: 39.6 % (ref 36.0–46.0)
Hemoglobin: 13 g/dL (ref 12.0–15.0)
Immature Granulocytes: 6 %
Lymphocytes Relative: 45 %
Lymphs Abs: 1.1 10*3/uL (ref 0.7–4.0)
MCH: 29.5 pg (ref 26.0–34.0)
MCHC: 32.8 g/dL (ref 30.0–36.0)
MCV: 89.8 fL (ref 80.0–100.0)
Monocytes Absolute: 0.3 10*3/uL (ref 0.1–1.0)
Monocytes Relative: 14 %
Neutro Abs: 0.7 10*3/uL — ABNORMAL LOW (ref 1.7–7.7)
Neutrophils Relative %: 28 %
Platelets: 176 10*3/uL (ref 150–400)
RBC: 4.41 MIL/uL (ref 3.87–5.11)
RDW: 15.8 % — ABNORMAL HIGH (ref 11.5–15.5)
Smear Review: NORMAL
WBC: 2.4 10*3/uL — ABNORMAL LOW (ref 4.0–10.5)
nRBC: 0 % (ref 0.0–0.2)

## 2021-01-28 NOTE — Progress Notes (Signed)
Pt states no new concerns for today's visit.  

## 2021-01-28 NOTE — Progress Notes (Signed)
REFERRING PROVIDER: Lloyd Huger, MD [address] [place],  [place] [address]  PRIMARY PROVIDER:  Patient, [name]  PRIMARY REASON FOR VISIT:  1. Invasive ductal carcinoma of left breast ([place])   2. Invasive ductal carcinoma of right breast ([place])      HISTORY OF PRESENT ILLNESS:   Ms. Shadoan, a 45 y.o. female, was seen for a [place] cancer genetics consultation at the request of Dr. Grayland Ormond due to a personal history of breast cancer.  Ms. Kalbfleisch presents to clinic today to discuss the possibility of a hereditary predisposition to cancer, genetic testing, and to further clarify her future cancer risks, as well as potential cancer risks for family members.   In 2022, at the age of 69, Ms. Rekowski was diagnosed with invasive ductal carcinoma of the right breast, triple negative, and invasive ductal carcinoma of the left breast, triple negative, distinct primaries. The current treatment plan includes neoadjuvant chemotherapy.   CANCER HISTORY:  Oncology History  Invasive ductal carcinoma of left breast ([place])  01/12/2021 Initial Diagnosis   Invasive ductal carcinoma of left breast ([place])   01/12/2021 Cancer Staging   Staging form: Breast, AJCC 8th Edition - Clinical stage from 01/12/2021: Stage IIB (cT2, cN1, cM0, G3, ER+, PR+, HER2-) - Signed by Lloyd Huger, MD on 01/12/2021 Stage prefix: Initial diagnosis Histologic grading system: 3 grade system    01/21/2021 -  Chemotherapy   Patient is on Treatment Plan : BREAST ADJUVANT DOSE DENSE AC q14d / PACLitaxel q7d     Invasive ductal carcinoma of right breast ([place])  01/12/2021 Initial Diagnosis   Invasive ductal carcinoma of right breast ([place])   01/12/2021 Cancer Staging   Staging form: Breast, AJCC 8th Edition - Clinical stage from 01/12/2021: Stage IB (cT2, cN0, cM0, G2, ER+, PR+, HER2-) - Signed by Lloyd Huger, MD on 01/12/2021 Stage prefix: Initial diagnosis Histologic grading  system: 3 grade system    01/21/2021 -  Chemotherapy   Patient is on Treatment Plan : BREAST ADJUVANT DOSE DENSE AC q14d / PACLitaxel q7d        RISK FACTORS:  Menarche was at age 43.  First live birth at age 53.  OCP use for approximately 0 years.  Ovaries intact: yes.  Hysterectomy: no.  Menopausal status: perimenopausal.  HRT use: 0 years. Colonoscopy: no; not examined. Mammogram within the last year: yes. Number of breast biopsies: 4. Up to date with pelvic exams: yes.  Past Medical History:  Diagnosis Date   Anxiety    Headache    Hypertension    Noncompliance     Past Surgical History:  Procedure Laterality Date   BREAST BIOPSY Right 12/24/2020   mass, 9:00 8 cmfn venus marker, path pending   BREAST BIOPSY Left 12/24/2020   mass 3:00 retroareolar, ribbon, path pending   BREAST BIOPSY Left 12/24/2020   mass 3:00 5cmfn, heart marker, path pending   BREAST BIOPSY Left 12/24/2020   axilla LN, hyrdomarker shape 3 coil, path pending   PORTACATH PLACEMENT Right 01/01/2021   Procedure: INSERTION PORT-A-CATH;  Surgeon: Ronny Bacon, MD;  Location: [facility] ORS;  Service: General;  Laterality: Right;   TUBAL LIGATION      Social History   Socioeconomic History   Marital status: Divorced    Spouse name: Not on file   Number of children: Not on file   Years of education: Not on file   Highest education level: Not on file  Occupational History  Not on file  Tobacco Use   Smoking status: Every Day    Packs/day: 1.50    Types: Cigarettes   Smokeless tobacco: Never  Vaping Use   Vaping Use: Never used  Substance and Sexual Activity   Alcohol use: Yes    Alcohol/week: 14.0 standard drinks    Types: 14 Cans of beer per week    Comment: at least 2 beers daily   Drug use: No   Sexual activity: Yes    Birth control/protection: None  Other Topics Concern   Not on file  Social History Narrative   Not on file   Social Determinants of Health   Financial  Resource Strain: Not on file  Food Insecurity: Not on file  Transportation Needs: Not on file  Physical Activity: Not on file  Stress: Not on file  Social Connections: Not on file     FAMILY HISTORY:  We obtained a detailed, 4-generation family history.  Significant diagnoses are listed below: Family History  Problem Relation Age of Onset   COPD Mother    Hypertension Mother    Heart disease Father    Ms. Buechner has 4 daughters (25, 48, 52, 20). She has two full sisters (30 and 33), no history of cancer. She has 1 maternal half brother (51) and 1 paternal half brother, 2 paternal half sisters, no cancers.  Ms. Hornbrook mother is living at 52, no history of cancer. Patient had 5 maternal uncles, 2 aunts, no cancers. Maternal grandmother died at 5, grandfather died over 19.   Ms. Natividad father is in his late 15s, no history of cancer. Patient had 2 paternal uncles, 2 paternal aunts, no cancers. Paternal grandmother died at 91 and grandfather died over 12.  Ms. Mcbane is unaware of previous family history of genetic testing for hereditary cancer risks. There is no reported Ashkenazi Jewish ancestry. There is no known consanguinity.    GENETIC COUNSELING ASSESSMENT: Ms. Kimberlin is a 45 y.o. female with a personal history of breast cancer which is somewhat suggestive of a hereditary cancer syndrome and predisposition to cancer. We, therefore, discussed and recommended the following at today's visit.   DISCUSSION: We discussed that approximately 10% of breast cancer is hereditary. Most cases of hereditary breast cancer are associated with BRCA1/BRCA2 genes, although there are other genes associated with hereditary cancer as well. Cancers and risks are gene specific.  We discussed that testing is beneficial for several reasons including surgical decision-making for breast cancer, knowing about other cancer risks, identifying potential screening and risk-reduction options that may be  appropriate, and to understand if other family members could be at risk for cancer and allow them to undergo genetic testing.   We reviewed the characteristics, features and inheritance patterns of hereditary cancer syndromes. We also discussed genetic testing, including the appropriate family members to test, the process of testing, insurance coverage and turn-around-time for results. We discussed the implications of a negative, positive and/or variant of uncertain significant result. We recommended Ms. Riess pursue genetic testing for the [facility] Multi-Cancer+RNA gene panel.   The Multi-Cancer Panel + RNA offered by [facility] includes sequencing and/or deletion duplication testing of the following 84 genes: AIP, ALK, APC, ATM, AXIN2,BAP1,  BARD1, BLM, BMPR1A, BRCA1, BRCA2, BRIP1, CASR, CDC73, CDH1, CDK4, CDKN1B, CDKN1C, CDKN2A (p14ARF), CDKN2A (p16INK4a), CEBPA, CHEK2, CTNNA1, DICER1, DIS3L2, EGFR (c.2369C>T, p.Thr790Met variant only), EPCAM (Deletion/duplication testing only), FH, FLCN, GATA2, GPC3, GREM1 (Promoter region deletion/duplication testing only), HOXB13 (c.251G>A, p.Gly84Glu), HRAS, KIT, MAX, MEN1,  MET, MITF (c.952G>A, p.Glu318Lys variant only), MLH1, MSH2, MSH3, MSH6, MUTYH, NBN, NF1, NF2, NTHL1, PALB2, PDGFRA, PHOX2B, PMS2, POLD1, POLE, POT1, PRKAR1A, PTCH1, PTEN, RAD50, RAD51C, RAD51D, RB1, RECQL4, RET, RUNX1, SDHAF2, SDHA (sequence changes only), SDHB, SDHC, SDHD, SMAD4, SMARCA4, SMARCB1, SMARCE1, STK11, SUFU, TERC, TERT, TMEM127, TP53, TSC1, TSC2, VHL, WRN and WT1.  Based on Ms. Pablo's personal history of cancer, she meets medical criteria for genetic testing. Despite that she meets criteria, she may still have an out of pocket cost. We discussed that if her out of pocket cost for testing is over $100, the laboratory will call and confirm whether she wants to proceed with testing.  If the out of pocket cost of testing is less than $100 she will be billed by the genetic testing  laboratory.   PLAN: After considering the risks, benefits, and limitations, Ms. Belknap provided informed consent to pursue genetic testing and the blood sample was sent to [facility] for analysis of the Multi-Cancer+RNA panel. Results should be available within approximately 2-3 weeks' time, at which point they will be disclosed by telephone to Ms. Schabel, as will any additional recommendations warranted by these results. Ms. Lieb will receive a summary of her genetic counseling visit and a copy of her results once available. This information will also be available in Epic.   Ms. Frith questions were answered to her satisfaction today. Our contact information was provided should additional questions or concerns arise. Thank you for the referral and allowing [place] to share in the care of your patient.   Faith Rogue, MS, [facility] Genetic Counselor [email or web address] Phone: [phone]  The patient was seen for a total of 25 minutes in face-to-face genetic counseling.  Patient was seen with her boyfriend, Barbaraann Rondo. Dr. Grayland Ormond was available for discussion regarding this case.   _______________________________________________________________________ For Office Staff:  Number of people involved in session: 2 Was an Intern/ student involved with case: no

## 2021-02-04 ENCOUNTER — Inpatient Hospital Stay: Payer: Medicaid Other

## 2021-02-04 ENCOUNTER — Other Ambulatory Visit: Payer: Self-pay

## 2021-02-04 ENCOUNTER — Inpatient Hospital Stay (HOSPITAL_BASED_OUTPATIENT_CLINIC_OR_DEPARTMENT_OTHER): Payer: Medicaid Other | Admitting: Oncology

## 2021-02-04 VITALS — BP 165/117 | HR 83 | Temp 97.8°F | Resp 16 | Wt 133.5 lb

## 2021-02-04 DIAGNOSIS — Z5111 Encounter for antineoplastic chemotherapy: Secondary | ICD-10-CM | POA: Diagnosis not present

## 2021-02-04 DIAGNOSIS — C50912 Malignant neoplasm of unspecified site of left female breast: Secondary | ICD-10-CM | POA: Diagnosis not present

## 2021-02-04 DIAGNOSIS — C50911 Malignant neoplasm of unspecified site of right female breast: Secondary | ICD-10-CM

## 2021-02-04 LAB — CBC WITH DIFFERENTIAL/PLATELET
Abs Immature Granulocytes: 0.64 10*3/uL — ABNORMAL HIGH (ref 0.00–0.07)
Basophils Absolute: 0.1 10*3/uL (ref 0.0–0.1)
Basophils Relative: 1 %
Eosinophils Absolute: 0 10*3/uL (ref 0.0–0.5)
Eosinophils Relative: 0 %
HCT: 37.4 % (ref 36.0–46.0)
Hemoglobin: 12.2 g/dL (ref 12.0–15.0)
Immature Granulocytes: 5 %
Lymphocytes Relative: 15 %
Lymphs Abs: 2 10*3/uL (ref 0.7–4.0)
MCH: 29.9 pg (ref 26.0–34.0)
MCHC: 32.6 g/dL (ref 30.0–36.0)
MCV: 91.7 fL (ref 80.0–100.0)
Monocytes Absolute: 1.1 10*3/uL — ABNORMAL HIGH (ref 0.1–1.0)
Monocytes Relative: 8 %
Neutro Abs: 9.5 10*3/uL — ABNORMAL HIGH (ref 1.7–7.7)
Neutrophils Relative %: 71 %
Platelets: 199 10*3/uL (ref 150–400)
RBC: 4.08 MIL/uL (ref 3.87–5.11)
RDW: 17 % — ABNORMAL HIGH (ref 11.5–15.5)
WBC: 13.4 10*3/uL — ABNORMAL HIGH (ref 4.0–10.5)
nRBC: 0.4 % — ABNORMAL HIGH (ref 0.0–0.2)

## 2021-02-04 LAB — COMPREHENSIVE METABOLIC PANEL
ALT: 10 U/L (ref 0–44)
AST: 16 U/L (ref 15–41)
Albumin: 4 g/dL (ref 3.5–5.0)
Alkaline Phosphatase: 116 U/L (ref 38–126)
Anion gap: 10 (ref 5–15)
BUN: 7 mg/dL (ref 6–20)
CO2: 22 mmol/L (ref 22–32)
Calcium: 8.9 mg/dL (ref 8.9–10.3)
Chloride: 103 mmol/L (ref 98–111)
Creatinine, Ser: 0.71 mg/dL (ref 0.44–1.00)
GFR, Estimated: >60 mL/min (ref >60)
Glucose, Bld: 89 mg/dL (ref 70–99)
Potassium: 3.9 mmol/L (ref 3.5–5.1)
Sodium: 135 mmol/L (ref 135–145)
Total Bilirubin: 0.4 mg/dL (ref 0.3–1.2)
Total Protein: 7.8 g/dL (ref 6.5–8.1)

## 2021-02-04 MED ORDER — HEPARIN SOD (PORK) LOCK FLUSH 100 UNIT/ML IV SOLN
INTRAVENOUS | Status: AC
  Filled 2021-02-04: qty 5

## 2021-02-04 MED ORDER — SODIUM CHLORIDE 0.9 % IV SOLN
10.0000 mg | Freq: Once | INTRAVENOUS | Status: AC
  Administered 2021-02-04: 10 mg via INTRAVENOUS
  Filled 2021-02-04: qty 10

## 2021-02-04 MED ORDER — SODIUM CHLORIDE 0.9 % IV SOLN
Freq: Once | INTRAVENOUS | Status: AC
  Filled 2021-02-04: qty 250

## 2021-02-04 MED ORDER — IBUPROFEN 800 MG PO TABS: 800.0000 mg | ORAL_TABLET | Freq: Three times a day (TID) | ORAL | 0 refills | Status: DC | PRN

## 2021-02-04 MED ORDER — PALONOSETRON HCL INJECTION 0.25 MG/5ML
0.2500 mg | Freq: Once | INTRAVENOUS | Status: AC
  Administered 2021-02-04: 0.25 mg via INTRAVENOUS
  Filled 2021-02-04: qty 5

## 2021-02-04 MED ORDER — SODIUM CHLORIDE 0.9 % IV SOLN
600.0000 mg/m2 | Freq: Once | INTRAVENOUS | Status: AC
  Administered 2021-02-04: 1000 mg via INTRAVENOUS
  Filled 2021-02-04: qty 50

## 2021-02-04 MED ORDER — ALPRAZOLAM 0.25 MG PO TABS: 0.2500 mg | ORAL_TABLET | Freq: Two times a day (BID) | ORAL | 0 refills | Status: DC | PRN

## 2021-02-04 MED ORDER — SODIUM CHLORIDE 0.9 % IV SOLN
150.0000 mg | Freq: Once | INTRAVENOUS | Status: AC
  Administered 2021-02-04: 150 mg via INTRAVENOUS
  Filled 2021-02-04: qty 150

## 2021-02-04 MED ORDER — DOXORUBICIN HCL CHEMO IV INJECTION 2 MG/ML
60.0000 mg/m2 | Freq: Once | INTRAVENOUS | Status: AC
  Administered 2021-02-04: 100 mg via INTRAVENOUS
  Filled 2021-02-04: qty 50

## 2021-02-04 NOTE — Patient Instructions (Signed)
[facility] CANCER CTR AT [place]  Discharge Instructions: Thank you for choosing [place] to provide your oncology and hematology care.  If you have a lab appointment with the [facility], please go directly to the [facility] and check in at the registration area.  Wear comfortable clothing and clothing appropriate for easy access to any Portacath or PICC line.   We strive to give you quality time with your provider. You may need to reschedule your appointment if you arrive late (15 or more minutes).  Arriving late affects you and other patients whose appointments are after yours.  Also, if you miss three or more appointments without notifying the office, you may be dismissed from the clinic at the providers discretion.      For prescription refill requests, have your pharmacy contact our office and allow 72 hours for refills to be completed.    Today you received the following chemotherapy and/or immunotherapy agents : Adriamycin  / Cytoxan    To help prevent nausea and vomiting after your treatment, we encourage you to take your nausea medication as directed.  BELOW ARE SYMPTOMS THAT SHOULD BE REPORTED IMMEDIATELY: *FEVER GREATER THAN 100.4 F (38 C) OR HIGHER *CHILLS OR SWEATING *NAUSEA AND VOMITING THAT IS NOT CONTROLLED WITH YOUR NAUSEA MEDICATION *UNUSUAL SHORTNESS OF BREATH *UNUSUAL BRUISING OR BLEEDING *URINARY PROBLEMS (pain or burning when urinating, or frequent urination) *BOWEL PROBLEMS (unusual diarrhea, constipation, pain near the anus) TENDERNESS IN MOUTH AND THROAT WITH OR WITHOUT PRESENCE OF ULCERS (sore throat, sores in mouth, or a toothache) UNUSUAL RASH, SWELLING OR PAIN  UNUSUAL VAGINAL DISCHARGE OR ITCHING   Items with * indicate a potential emergency and should be followed up as soon as possible or go to the Emergency Department if any problems should occur.  Please show the CHEMOTHERAPY ALERT CARD or IMMUNOTHERAPY ALERT CARD at  check-in to the Emergency Department and triage nurse.  Should you have questions after your visit or need to cancel or reschedule your appointment, please contact [facility] CANCER [place] AT [place]  [phone] and follow the prompts.  Office hours are 8:00 a.m. to 4:30 p.m. Monday - Friday. Please note that voicemails left after 4:00 p.m. may not be returned until the following business day.  We are closed weekends and major holidays. You have access to a nurse at all times for urgent questions. Please call the main number to the clinic [phone] and follow the prompts.  For any non-urgent questions, you may also contact your provider using MyChart. We now offer e-Visits for anyone 49 and older to request care online for non-urgent symptoms. For details visit [email or web address].   Also download the MyChart app! Go to the app store, search "MyChart", open the app, select [place], and log in with your MyChart username and password.  Due to Covid, a mask is required upon entering the hospital/clinic. If you do not have a mask, one will be given to you upon arrival. For doctor visits, patients may have 1 support person aged 23 or older with them. For treatment visits, patients cannot have anyone with them due to current Covid guidelines and our immunocompromised population.

## 2021-02-04 NOTE — Progress Notes (Signed)
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  INTERVAL HISTORY: Patient returns to clinic today for further evaluation and consideration of cycle 2 of Adriamycin and Cytoxan.  She is tolerating her treatments well without significant side effects.  She currently feels well and is asymptomatic. She has no neurologic complaints.  She denies any recent fevers or illnesses.  She has a fair appetite, but denies weight loss.  She has no chest pain, shortness of breath, cough, or hemoptysis.  She denies any nausea, vomiting, constipation, or diarrhea.  She has no urinary complaints.  Patient offers no specific complaints today.  REVIEW OF SYSTEMS:   Review of Systems  Constitutional: Negative.  Negative for chills, fever and malaise/fatigue.  Respiratory: Negative.  Negative for cough, hemoptysis and shortness of breath.   Cardiovascular: Negative.  Negative for chest pain and leg swelling.  Gastrointestinal: Negative.  Negative for abdominal pain and nausea.  Genitourinary: Negative.  Negative for dysuria.  Musculoskeletal: Negative.  Negative for back pain.  Skin: Negative.  Negative for rash.  Neurological: Negative.  Negative for dizziness, focal weakness, weakness and headaches.  Psychiatric/Behavioral: Negative.  The patient is not nervous/anxious and does not have insomnia.    As per HPI. Otherwise, a complete review of systems is negative.  PAST MEDICAL HISTORY: Past Medical History:  Diagnosis Date   Anxiety    Headache    Hypertension    Noncompliance     PAST SURGICAL HISTORY: Past Surgical History:  Procedure Laterality Date   BREAST BIOPSY Right  12/24/2020   mass, 9:00 8 cmfn venus marker, path pending   BREAST BIOPSY Left 12/24/2020   mass 3:00 retroareolar, ribbon, path pending   BREAST BIOPSY Left 12/24/2020   mass 3:00 5cmfn, heart marker, path pending   BREAST BIOPSY Left 12/24/2020   axilla LN, hyrdomarker shape 3 coil, path pending   PORTACATH PLACEMENT Right 01/01/2021   Procedure: INSERTION PORT-A-CATH;  Surgeon: Ronny Bacon, MD;  Location: [facility] ORS;  Service: General;  Laterality: Right;   TUBAL LIGATION      FAMILY HISTORY: Family History  Problem Relation Age of Onset   COPD Mother    Hypertension Mother    Heart disease Father     ADVANCED DIRECTIVES (Y/N):  N  HEALTH MAINTENANCE: Social History   Tobacco Use   Smoking status: Every Day    Packs/day: 1.50    Types: Cigarettes   Smokeless tobacco: Never  Vaping Use   Vaping Use: Never used  Substance Use Topics   Alcohol use: Yes    Alcohol/week: 14.0 standard drinks    Types: 14 Cans of beer per week    Comment: at least 2 beers daily   Drug use: No     Colonoscopy:  PAP:  Bone density:  Lipid panel:  Allergies  Allergen Reactions   Codeine Itching and Nausea And Vomiting    Current Outpatient Medications  Medication Sig Dispense Refill   acetaminophen (TYLENOL) 325 MG tablet Take 650 mg by mouth every 6 (six) hours as needed.     bacitracin ointment Apply 1 application topically 2 (two) times daily. 120 g 0   diphenhydrAMINE HCl (BENADRYL ALLERGY PO) Take by mouth.  diphenhydrAMINE-zinc acetate (BENADRYL EXTRA STRENGTH) cream Apply 1 application topically 3 (three) times daily as needed for itching. 28.4 g 0   labetalol (NORMODYNE) 100 MG tablet Take 1 tablet (100 mg total) by mouth 2 (two) times daily. 60 tablet 1   lidocaine-prilocaine (EMLA) cream Apply to affected area once 30 g 3   loratadine (CLARITIN) 10 MG tablet Take 10 mg by mouth daily.     ondansetron (ZOFRAN) 8 MG tablet Take 1 tablet (8 mg total) by mouth 2 (two)  times daily. 60 tablet 2   prochlorperazine (COMPAZINE) 10 MG tablet Take 1 tablet (10 mg total) by mouth every 6 (six) hours as needed for nausea or vomiting. 60 tablet 2   ALPRAZolam (XANAX) 0.25 MG tablet Take 1 tablet (0.25 mg total) by mouth 2 (two) times daily as needed for anxiety. 60 tablet 0   ibuprofen (ADVIL) 800 MG tablet Take 1 tablet (800 mg total) by mouth every 8 (eight) hours as needed. 30 tablet 0   No current facility-administered medications for this visit.    OBJECTIVE: Vitals:   02/04/21 0855  BP: (!) 165/117  Pulse: 83  Resp: 16  Temp: 97.8 F (36.6 C)  SpO2: 99%     Body mass index is 22.92 kg/m.    ECOG FS:0 - Asymptomatic  General: Well-developed, well-nourished, no acute distress. Eyes: Pink conjunctiva, anicteric sclera. HEENT: Normocephalic, moist mucous membranes. Lungs: No audible wheezing or coughing. Heart: Regular rate and rhythm. Abdomen: Soft, nontender, no obvious distention. Musculoskeletal: No edema, cyanosis, or clubbing. Neuro: Alert, answering all questions appropriately. Cranial nerves grossly intact. Skin: No rashes or petechiae noted. Psych: Normal affect.  LAB RESULTS:  Lab Results  Component Value Date   NA 135 02/04/2021   K 3.9 02/04/2021   CL 103 02/04/2021   CO2 22 02/04/2021   GLUCOSE 89 02/04/2021   BUN 7 02/04/2021   CREATININE 0.71 02/04/2021   CALCIUM 8.9 02/04/2021   PROT 7.8 02/04/2021   ALBUMIN 4.0 02/04/2021   AST 16 02/04/2021   ALT 10 02/04/2021   ALKPHOS 116 02/04/2021   BILITOT 0.4 02/04/2021   GFRNONAA >60 02/04/2021   GFRAA >60 12/01/2017    Lab Results  Component Value Date   WBC 13.4 (H) 02/04/2021   NEUTROABS 9.5 (H) 02/04/2021   HGB 12.2 02/04/2021   HCT 37.4 02/04/2021   MCV 91.7 02/04/2021   PLT 199 02/04/2021     STUDIES: NM Cardiac Muga Rest  Result Date: 01/08/2021 CLINICAL DATA:  Breast cancer. Evaluate cardiac function in relation to chemotherapy. EXAM: NUCLEAR MEDICINE  CARDIAC BLOOD POOL IMAGING (MUGA) TECHNIQUE: Cardiac multi-gated acquisition was performed at rest following intravenous injection of Tc-70mlabeled red blood cells. RADIOPHARMACEUTICALS:  20.2 mCi Tc-989mertechnetate in-vitro labeled red blood cells IV COMPARISON:  None. FINDINGS: No  focal wall motion abnormality of the left ventricle. Calculated left ventricular ejection fraction equals 57.3 IMPRESSION: Left ventricular ejection fraction equals57.3 %. Electronically Signed   By: StSuzy Bouchard.D.   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  PLAN:    1.  Bilateral breast cancer: Case discussed with pathology and this appears to be 2 distinct breast cancer primaries in her right and left breast both of which are ER/PR, HER2 negative malignancies.  CT scan completed on December 30, 2020 reviewed independently and reported as above did not reveal any metastatic disease.  MUGA scan on January 08, 2021 reported an EF of 57.3%.  Patient has also had port placement.  Previously, surgery recommended upfront systemic therapy. Given patient's age, she will also require genetic testing.  Plan to give neoadjuvant Adriamycin plus Cytoxan every 2 weeks with Neulasta support for 4 cycles followed by weekly Taxol for 12 cycles.  Patient also may benefit from adjuvant Xeloda.  Proceed with cycle 2 of treatment today.  Return to clinic tomorrow for Neulasta and then in 2 weeks for further evaluation and consideration of cycle 3.   2.  Nausea: Patient does not complain of this today.  Continue Compazine as needed. 3.  Anxiety/insomnia: Continue Xanax as needed. 4.  Leukopenia: Resolved.  Patient's leukocytosis is likely related to Neulasta.  Patient expressed understanding and was in agreement with this plan. She also understands that She can call clinic at any time with any questions, concerns, or complaints.    Cancer Staging  Invasive ductal carcinoma of left breast  ([place]) Staging form: Breast, AJCC 8th Edition - Clinical stage from 01/12/2021: Stage IIB (cT2, cN1, cM0, G3, ER+, PR+, HER2-) - Signed by Lloyd Huger, MD on 01/12/2021 Stage prefix: Initial diagnosis Histologic grading system: 3 grade system  Invasive ductal carcinoma of right breast [facility]) Staging form: Breast, AJCC 8th Edition - Clinical stage from 01/12/2021: Stage IB (cT2, cN0, cM0, G2, ER+, PR+, HER2-) - Signed by Lloyd Huger, MD on 01/12/2021 Stage prefix: Initial diagnosis Histologic grading system: 3 grade system  Lloyd Huger, MD   02/04/2021 9:47 AM

## 2021-02-04 NOTE — Progress Notes (Signed)
Pt requesting refill of ibuprofen and xanax. Reports one day of tingling in her hands that has since resolved.

## 2021-02-05 ENCOUNTER — Inpatient Hospital Stay: Payer: Medicaid Other

## 2021-02-05 DIAGNOSIS — Z5111 Encounter for antineoplastic chemotherapy: Secondary | ICD-10-CM | POA: Diagnosis not present

## 2021-02-05 DIAGNOSIS — C50912 Malignant neoplasm of unspecified site of left female breast: Secondary | ICD-10-CM

## 2021-02-05 DIAGNOSIS — C50911 Malignant neoplasm of unspecified site of right female breast: Secondary | ICD-10-CM

## 2021-02-05 MED ORDER — PEGFILGRASTIM-CBQV 6 MG/0.6ML ~~LOC~~ SOSY
6.0000 mg | PREFILLED_SYRINGE | Freq: Once | SUBCUTANEOUS | Status: AC
  Administered 2021-02-05: 6 mg via SUBCUTANEOUS
  Filled 2021-02-05: qty 0.6

## 2021-02-15 NOTE — Progress Notes (Signed)
[place]  Telephone:([phone] Fax:(336) [phone]  ID: [name] OB: August 20, 1975  MR#: [id number]  YBO#:[id number]  Patient Care Team: Patient, [name] as PCP - General ([place]) Rico Junker, RN as Registered Nurse Theodore Demark, RN as Registered Nurse  CHIEF COMPLAINT: Bilateral triple negative breast cancer.  INTERVAL HISTORY: Patient returns to clinic today for further evaluation and consideration of cycle 3 of Adriamycin and Cytoxan.  She currently feels well and is asymptomatic.  She is tolerating her treatments well without significant side effects.  She has no neurologic complaints.  She denies any recent fevers or illnesses.  She has a fair appetite, but denies weight loss.  She has no chest pain, shortness of breath, cough, or hemoptysis.  She denies any nausea, vomiting, constipation, or diarrhea.  She has no urinary complaints.  Patient offers no specific complaints today.  REVIEW OF SYSTEMS:   Review of Systems  Constitutional: Negative.  Negative for chills, fever and malaise/fatigue.  Respiratory: Negative.  Negative for cough, hemoptysis and shortness of breath.   Cardiovascular: Negative.  Negative for chest pain and leg swelling.  Gastrointestinal: Negative.  Negative for abdominal pain and nausea.  Genitourinary: Negative.  Negative for dysuria.  Musculoskeletal: Negative.  Negative for back pain.  Skin: Negative.  Negative for rash.  Neurological: Negative.  Negative for dizziness, focal weakness, weakness and headaches.  Psychiatric/Behavioral: Negative.  The patient is not nervous/anxious and does not have insomnia.    As per HPI. Otherwise, a complete review of systems is negative.  PAST MEDICAL HISTORY: Past Medical History:  Diagnosis Date   Anxiety    Headache    Hypertension    Noncompliance     PAST SURGICAL HISTORY: Past Surgical History:  Procedure Laterality Date   BREAST BIOPSY Right  12/24/2020   mass, 9:00 8 cmfn venus marker, path pending   BREAST BIOPSY Left 12/24/2020   mass 3:00 retroareolar, ribbon, path pending   BREAST BIOPSY Left 12/24/2020   mass 3:00 5cmfn, heart marker, path pending   BREAST BIOPSY Left 12/24/2020   axilla LN, hyrdomarker shape 3 coil, path pending   PORTACATH PLACEMENT Right 01/01/2021   Procedure: INSERTION PORT-A-CATH;  Surgeon: Ronny Bacon, MD;  Location: [facility] ORS;  Service: General;  Laterality: Right;   TUBAL LIGATION      FAMILY HISTORY: Family History  Problem Relation Age of Onset   COPD Mother    Hypertension Mother    Heart disease Father     ADVANCED DIRECTIVES (Y/N):  N  HEALTH MAINTENANCE: Social History   Tobacco Use   Smoking status: Every Day    Packs/day: 1.50    Types: Cigarettes   Smokeless tobacco: Never  Vaping Use   Vaping Use: Never used  Substance Use Topics   Alcohol use: Yes    Alcohol/week: 14.0 standard drinks    Types: 14 Cans of beer per week    Comment: at least 2 beers daily   Drug use: No     Colonoscopy:  PAP:  Bone density:  Lipid panel:  Allergies  Allergen Reactions   Codeine Itching and Nausea And Vomiting    Current Outpatient Medications  Medication Sig Dispense Refill   acetaminophen (TYLENOL) 325 MG tablet Take 650 mg by mouth every 6 (six) hours as needed.     bacitracin ointment Apply 1 application topically 2 (two) times daily. 120 g 0   diphenhydrAMINE HCl (BENADRYL ALLERGY PO) Take by  mouth.     diphenhydrAMINE-zinc acetate (BENADRYL EXTRA STRENGTH) cream Apply 1 application topically 3 (three) times daily as needed for itching. 28.4 g 0   ibuprofen (ADVIL) 800 MG tablet Take 1 tablet (800 mg total) by mouth every 8 (eight) hours as needed. 30 tablet 0   labetalol (NORMODYNE) 100 MG tablet Take 1 tablet (100 mg total) by mouth 2 (two) times daily. 60 tablet 1   lidocaine-prilocaine (EMLA) cream Apply to affected area once 30 g 3   loratadine (CLARITIN) 10  MG tablet Take 10 mg by mouth daily.     ondansetron (ZOFRAN) 8 MG tablet Take 1 tablet (8 mg total) by mouth 2 (two) times daily. 60 tablet 2   prochlorperazine (COMPAZINE) 10 MG tablet Take 1 tablet (10 mg total) by mouth every 6 (six) hours as needed for nausea or vomiting. 60 tablet 2   ALPRAZolam (XANAX) 0.25 MG tablet Take 1 tablet (0.25 mg total) by mouth 2 (two) times daily as needed for anxiety. 60 tablet 2   No current facility-administered medications for this visit.   Facility-Administered Medications Ordered in Other Visits  Medication Dose Route Frequency Provider Last Rate Last Admin   cyclophosphamide (CYTOXAN) 1,000 mg in sodium chloride 0.9 % 250 mL chemo infusion  600 mg/m2 (Treatment Plan Recorded) Intravenous Once Lloyd Huger, MD       DOXOrubicin (ADRIAMYCIN) chemo injection 100 mg  60 mg/m2 (Treatment Plan Recorded) Intravenous Once Lloyd Huger, MD       heparin lock flush 100 UNIT/ML injection            palonosetron (ALOXI) injection 0.25 mg  0.25 mg Intravenous Once Lloyd Huger, MD        OBJECTIVE: Vitals:   02/18/21 0952  BP: (!) 164/111  Pulse: 92  Resp: 20  Temp: 98.7 F (37.1 C)  SpO2: 100%     Body mass index is 23.4 kg/m.    ECOG FS:0 - Asymptomatic  General: Well-developed, well-nourished, no acute distress. Eyes: Pink conjunctiva, anicteric sclera. HEENT: Normocephalic, moist mucous membranes. Lungs: No audible wheezing or coughing. Heart: Regular rate and rhythm. Abdomen: Soft, nontender, no obvious distention. Musculoskeletal: No edema, cyanosis, or clubbing. Neuro: Alert, answering all questions appropriately. Cranial nerves grossly intact. Skin: No rashes or petechiae noted. Psych: Normal affect.  LAB RESULTS:  Lab Results  Component Value Date   NA 134 (L) 02/18/2021   K 3.8 02/18/2021   CL 99 02/18/2021   CO2 25 02/18/2021   GLUCOSE 94 02/18/2021   BUN 8 02/18/2021   CREATININE 0.59 02/18/2021   CALCIUM  9.1 02/18/2021   PROT 7.2 02/18/2021   ALBUMIN 3.5 02/18/2021   AST 12 (L) 02/18/2021   ALT 8 02/18/2021   ALKPHOS 101 02/18/2021   BILITOT <0.1 (L) 02/18/2021   GFRNONAA >60 02/18/2021   GFRAA >60 12/01/2017    Lab Results  Component Value Date   WBC 12.8 (H) 02/18/2021   NEUTROABS 9.0 (H) 02/18/2021   HGB 11.1 (L) 02/18/2021   HCT 34.7 (L) 02/18/2021   MCV 93.5 02/18/2021   PLT 214 02/18/2021     STUDIES: No results found.  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  PLAN:    1.  Bilateral breast cancer: Case discussed with pathology and this appears to be 2 distinct breast cancer primaries in her right and left breast both of which are ER/PR, HER2 negative malignancies.  CT scan completed on December 30, 2020 reviewed independently  and reported as above did not reveal any metastatic disease.  MUGA scan on January 08, 2021 reported an EF of 57.3%.  Patient has also had port placement.  Previously, surgery recommended upfront systemic therapy. Given patient's age, she will also require genetic testing.  Plan to give neoadjuvant Adriamycin plus Cytoxan every 2 weeks with Neulasta support for 4 cycles followed by weekly Taxol for 12 cycles.  Patient also may benefit from adjuvant Xeloda.  Proceed with cycle 3 of treatment today.  Return to clinic tomorrow for Neulasta and then in 2 weeks for further evaluation and consideration of cycle 4. 2.  Nausea: Patient does not complain of this today.  Continue Compazine as needed. 3.  Anxiety/insomnia: Continue Xanax as needed.  Patient was given a refill today. 4.  Leukocytosis: Likely secondary to Neulasta, monitor. 5.  Anemia: Patient's hemoglobin is trended down slightly to 11.1, monitor.  Patient expressed understanding and was in agreement with this plan. She also understands that She can call clinic at any time with any questions, concerns, or complaints.    Cancer Staging  Invasive ductal carcinoma of left breast  ([facility]) Staging form: Breast, AJCC 8th Edition - Clinical stage from 01/12/2021: Stage IIB (cT2, cN1, cM0, G3, ER+, PR+, HER2-) - Signed by Lloyd Huger, MD on 01/12/2021 Stage prefix: Initial diagnosis Histologic grading system: 3 grade system  Invasive ductal carcinoma of right breast [facility]) Staging form: Breast, AJCC 8th Edition - Clinical stage from 01/12/2021: Stage IB (cT2, cN0, cM0, G2, ER+, PR+, HER2-) - Signed by Lloyd Huger, MD on 01/12/2021 Stage prefix: Initial diagnosis Histologic grading system: 3 grade system  Lloyd Huger, MD   02/18/2021 11:26 AM

## 2021-02-18 ENCOUNTER — Inpatient Hospital Stay: Payer: Medicaid Other

## 2021-02-18 ENCOUNTER — Encounter: Payer: Self-pay | Admitting: Oncology

## 2021-02-18 ENCOUNTER — Inpatient Hospital Stay (HOSPITAL_BASED_OUTPATIENT_CLINIC_OR_DEPARTMENT_OTHER): Payer: Medicaid Other | Admitting: Oncology

## 2021-02-18 ENCOUNTER — Other Ambulatory Visit: Payer: Self-pay

## 2021-02-18 VITALS — BP 164/111 | HR 92 | Temp 98.7°F | Resp 20 | Wt 136.3 lb

## 2021-02-18 DIAGNOSIS — C50912 Malignant neoplasm of unspecified site of left female breast: Secondary | ICD-10-CM | POA: Diagnosis not present

## 2021-02-18 DIAGNOSIS — C50911 Malignant neoplasm of unspecified site of right female breast: Secondary | ICD-10-CM

## 2021-02-18 DIAGNOSIS — Z5111 Encounter for antineoplastic chemotherapy: Secondary | ICD-10-CM | POA: Diagnosis not present

## 2021-02-18 LAB — CBC WITH DIFFERENTIAL/PLATELET
Abs Immature Granulocytes: 0.47 10*3/uL — ABNORMAL HIGH (ref 0.00–0.07)
Basophils Absolute: 0.1 10*3/uL (ref 0.0–0.1)
Basophils Relative: 1 %
Eosinophils Absolute: 0 10*3/uL (ref 0.0–0.5)
Eosinophils Relative: 0 %
HCT: 34.7 % — ABNORMAL LOW (ref 36.0–46.0)
Hemoglobin: 11.1 g/dL — ABNORMAL LOW (ref 12.0–15.0)
Immature Granulocytes: 4 %
Lymphocytes Relative: 14 %
Lymphs Abs: 1.8 10*3/uL (ref 0.7–4.0)
MCH: 29.9 pg (ref 26.0–34.0)
MCHC: 32 g/dL (ref 30.0–36.0)
MCV: 93.5 fL (ref 80.0–100.0)
Monocytes Absolute: 1.4 10*3/uL — ABNORMAL HIGH (ref 0.1–1.0)
Monocytes Relative: 11 %
Neutro Abs: 9 10*3/uL — ABNORMAL HIGH (ref 1.7–7.7)
Neutrophils Relative %: 70 %
Platelets: 214 10*3/uL (ref 150–400)
RBC: 3.71 MIL/uL — ABNORMAL LOW (ref 3.87–5.11)
RDW: 17.4 % — ABNORMAL HIGH (ref 11.5–15.5)
WBC: 12.8 10*3/uL — ABNORMAL HIGH (ref 4.0–10.5)
nRBC: 0 % (ref 0.0–0.2)

## 2021-02-18 LAB — COMPREHENSIVE METABOLIC PANEL
ALT: 8 U/L (ref 0–44)
AST: 12 U/L — ABNORMAL LOW (ref 15–41)
Albumin: 3.5 g/dL (ref 3.5–5.0)
Alkaline Phosphatase: 101 U/L (ref 38–126)
Anion gap: 10 (ref 5–15)
BUN: 8 mg/dL (ref 6–20)
CO2: 25 mmol/L (ref 22–32)
Calcium: 9.1 mg/dL (ref 8.9–10.3)
Chloride: 99 mmol/L (ref 98–111)
Creatinine, Ser: 0.59 mg/dL (ref 0.44–1.00)
GFR, Estimated: >60 mL/min (ref >60)
Glucose, Bld: 94 mg/dL (ref 70–99)
Potassium: 3.8 mmol/L (ref 3.5–5.1)
Sodium: 134 mmol/L — ABNORMAL LOW (ref 135–145)
Total Bilirubin: <0.1 mg/dL — ABNORMAL LOW (ref 0.3–1.2)
Total Protein: 7.2 g/dL (ref 6.5–8.1)

## 2021-02-18 MED ORDER — PALONOSETRON HCL INJECTION 0.25 MG/5ML
0.2500 mg | Freq: Once | INTRAVENOUS | Status: AC
  Administered 2021-02-18: 11:00:00 0.25 mg via INTRAVENOUS
  Filled 2021-02-18: qty 5

## 2021-02-18 MED ORDER — HEPARIN SOD (PORK) LOCK FLUSH 100 UNIT/ML IV SOLN
INTRAVENOUS | Status: AC
  Filled 2021-02-18: qty 5

## 2021-02-18 MED ORDER — ALPRAZOLAM 0.25 MG PO TABS: 0.2500 mg | ORAL_TABLET | Freq: Two times a day (BID) | ORAL | 0 refills | Status: DC | PRN

## 2021-02-18 MED ORDER — SODIUM CHLORIDE 0.9 % IV SOLN
600.0000 mg/m2 | Freq: Once | INTRAVENOUS | Status: AC
  Administered 2021-02-18: 12:00:00 1000 mg via INTRAVENOUS
  Filled 2021-02-18: qty 50

## 2021-02-18 MED ORDER — ALPRAZOLAM 0.25 MG PO TABS: 0.2500 mg | ORAL_TABLET | Freq: Two times a day (BID) | ORAL | 2 refills | Status: DC | PRN

## 2021-02-18 MED ORDER — SODIUM CHLORIDE 0.9 % IV SOLN
150.0000 mg | Freq: Once | INTRAVENOUS | Status: AC
  Administered 2021-02-18: 11:00:00 150 mg via INTRAVENOUS
  Filled 2021-02-18: qty 5

## 2021-02-18 MED ORDER — DOXORUBICIN HCL CHEMO IV INJECTION 2 MG/ML
60.0000 mg/m2 | Freq: Once | INTRAVENOUS | Status: AC
  Administered 2021-02-18: 11:00:00 100 mg via INTRAVENOUS
  Filled 2021-02-18: qty 50

## 2021-02-18 MED ORDER — SODIUM CHLORIDE 0.9 % IV SOLN
Freq: Once | INTRAVENOUS | Status: AC
  Filled 2021-02-18: qty 250

## 2021-02-18 MED ORDER — SODIUM CHLORIDE 0.9 % IV SOLN
10.0000 mg | Freq: Once | INTRAVENOUS | Status: AC
  Administered 2021-02-18: 11:00:00 10 mg via INTRAVENOUS
  Filled 2021-02-18: qty 1

## 2021-02-18 NOTE — Patient Instructions (Signed)
[facility] CANCER CTR AT [place]  Discharge Instructions: Thank you for choosing [place] to provide your oncology and hematology care.  If you have a lab appointment with the [place], please go directly to the [place] and check in at the registration area.  Wear comfortable clothing and clothing appropriate for easy access to any Portacath or PICC line.   We strive to give you quality time with your provider. You may need to reschedule your appointment if you arrive late (15 or more minutes).  Arriving late affects you and other patients whose appointments are after yours.  Also, if you miss three or more appointments without notifying the office, you may be dismissed from the clinic at the providers discretion.      For prescription refill requests, have your pharmacy contact our office and allow 72 hours for refills to be completed.    Today you received the following chemotherapy and/or immunotherapy agents: Adriamycin / Cytoxan   To help prevent nausea and vomiting after your treatment, we encourage you to take your nausea medication as directed.  BELOW ARE SYMPTOMS THAT SHOULD BE REPORTED IMMEDIATELY: *FEVER GREATER THAN 100.4 F (38 C) OR HIGHER *CHILLS OR SWEATING *NAUSEA AND VOMITING THAT IS NOT CONTROLLED WITH YOUR NAUSEA MEDICATION *UNUSUAL SHORTNESS OF BREATH *UNUSUAL BRUISING OR BLEEDING *URINARY PROBLEMS (pain or burning when urinating, or frequent urination) *BOWEL PROBLEMS (unusual diarrhea, constipation, pain near the anus) TENDERNESS IN MOUTH AND THROAT WITH OR WITHOUT PRESENCE OF ULCERS (sore throat, sores in mouth, or a toothache) UNUSUAL RASH, SWELLING OR PAIN  UNUSUAL VAGINAL DISCHARGE OR ITCHING   Items with * indicate a potential emergency and should be followed up as soon as possible or go to the Emergency Department if any problems should occur.  Please show the CHEMOTHERAPY ALERT CARD or IMMUNOTHERAPY ALERT CARD at  check-in to the Emergency Department and triage nurse.  Should you have questions after your visit or need to cancel or reschedule your appointment, please contact [facility] CANCER [place] AT [place]  [phone] and follow the prompts.  Office hours are 8:00 a.m. to 4:30 p.m. Monday - Friday. Please note that voicemails left after 4:00 p.m. may not be returned until the following business day.  We are closed weekends and major holidays. You have access to a nurse at all times for urgent questions. Please call the main number to the clinic [phone] and follow the prompts.  For any non-urgent questions, you may also contact your provider using MyChart. We now offer e-Visits for anyone 46 and older to request care online for non-urgent symptoms. For details visit [email or web address].   Also download the MyChart app! Go to the app store, search "MyChart", open the app, select [place], and log in with your MyChart username and password.  Due to Covid, a mask is required upon entering the hospital/clinic. If you do not have a mask, one will be given to you upon arrival. For doctor visits, patients may have 1 support person aged 46 or older with them. For treatment visits, patients cannot have anyone with them due to current Covid guidelines and our immunocompromised population.

## 2021-02-19 ENCOUNTER — Inpatient Hospital Stay: Payer: Medicaid Other

## 2021-02-19 DIAGNOSIS — Z5111 Encounter for antineoplastic chemotherapy: Secondary | ICD-10-CM | POA: Diagnosis not present

## 2021-02-19 DIAGNOSIS — C50912 Malignant neoplasm of unspecified site of left female breast: Secondary | ICD-10-CM

## 2021-02-19 DIAGNOSIS — C50911 Malignant neoplasm of unspecified site of right female breast: Secondary | ICD-10-CM

## 2021-02-19 MED ORDER — PEGFILGRASTIM-CBQV 6 MG/0.6ML ~~LOC~~ SOSY
6.0000 mg | PREFILLED_SYRINGE | Freq: Once | SUBCUTANEOUS | Status: AC
  Administered 2021-02-19: 14:00:00 6 mg via SUBCUTANEOUS
  Filled 2021-02-19: qty 0.6

## 2021-02-23 ENCOUNTER — Ambulatory Visit: Payer: Self-pay | Admitting: Licensed Clinical Social Worker

## 2021-02-23 ENCOUNTER — Telehealth: Payer: Self-pay | Admitting: Licensed Clinical Social Worker

## 2021-02-23 ENCOUNTER — Encounter: Payer: Self-pay | Admitting: Licensed Clinical Social Worker

## 2021-02-23 DIAGNOSIS — Z1379 Encounter for other screening for genetic and chromosomal anomalies: Secondary | ICD-10-CM

## 2021-02-23 DIAGNOSIS — C50911 Malignant neoplasm of unspecified site of right female breast: Secondary | ICD-10-CM

## 2021-02-23 DIAGNOSIS — C50912 Malignant neoplasm of unspecified site of left female breast: Secondary | ICD-10-CM

## 2021-02-23 NOTE — Telephone Encounter (Signed)
Revealed negative genetic testing.  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  We discussed that we do not know why she has breast cancer or why there is cancer in the family. It could be due to a different gene that we are not testing, or something our current technology cannot pick up.  It will be important for her to keep in contact with genetics to learn if additional testing may be needed in the future.

## 2021-02-23 NOTE — Progress Notes (Signed)
HPI:  Briana Soto was previously seen in the [facility] clinic due to a personal  history of breast cancer and concerns regarding a hereditary predisposition to cancer. Please refer to our prior cancer genetics clinic note for more information regarding our discussion, assessment and recommendations, at the time. Briana Soto recent genetic test results were disclosed to her, as were recommendations warranted by these results. These results and recommendations are discussed in more detail below.  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 No pathogenic variants identified on the [facility] Multi-Cancer+RNA panel. VUS in ATM called c.1838T>G, VUS in EGFR called c.2884C>G, VUS in POLD1 called c.1072C>T, and VUS in RUNX1 called c.872T>C identified. The report date is 02/13/2021.  The Multi-Cancer Panel + RNA offered by [facility] includes sequencing and/or deletion duplication testing of the following 84 genes: AIP, ALK, APC, ATM, AXIN2,BAP1,  BARD1, BLM, BMPR1A, BRCA1, BRCA2, BRIP1, CASR, CDC73, CDH1, CDK4, CDKN1B, CDKN1C, CDKN2A (p14ARF), CDKN2A (p16INK4a), CEBPA, CHEK2, CTNNA1, DICER1, DIS3L2, EGFR (c.2369C>T, p.Thr790Met variant only), EPCAM (Deletion/duplication testing only), FH, FLCN, GATA2, GPC3, GREM1 (Promoter region deletion/duplication testing only), HOXB13 (c.251G>A, p.Gly84Glu), HRAS, KIT, MAX, MEN1, MET, MITF (c.952G>A,  p.Glu318Lys variant only), MLH1, MSH2, MSH3, MSH6, MUTYH, NBN, NF1, NF2, NTHL1, PALB2, PDGFRA, PHOX2B, PMS2, POLD1, POLE, POT1, PRKAR1A, PTCH1, PTEN, RAD50, RAD51C, RAD51D, RB1, RECQL4, RET, RUNX1, SDHAF2, SDHA (sequence changes only), SDHB, SDHC, SDHD, SMAD4, SMARCA4, SMARCB1, SMARCE1, STK11, SUFU, TERC, TERT, TMEM127, TP53, TSC1, TSC2, VHL, WRN and WT1.   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 No pathogenic variants identified on the [facility] Multi-Cancer+RNA panel. VUS in ATM called c.1838T>G, VUS in EGFR called c.2884C>G, VUS in POLD1 called c.1072C>T, and VUS in RUNX1 called c.872T>C identified. The report date is 02/13/2021.  The Multi-Cancer Panel + RNA offered by [facility] includes sequencing and/or deletion duplication testing of the following 84 genes: AIP, ALK, APC, ATM, AXIN2,BAP1,  BARD1, BLM, BMPR1A, BRCA1, BRCA2, BRIP1, CASR, CDC73, CDH1, CDK4, CDKN1B, CDKN1C, CDKN2A (p14ARF), CDKN2A (p16INK4a), CEBPA, CHEK2, CTNNA1, DICER1, DIS3L2, EGFR (c.2369C>T, p.Thr790Met variant only), EPCAM (Deletion/duplication testing only), FH, FLCN, GATA2, GPC3, GREM1 (Promoter region deletion/duplication testing only), HOXB13 (c.251G>A, p.Gly84Glu), HRAS, KIT, MAX, MEN1, MET, MITF (c.952G>A, p.Glu318Lys variant only), MLH1, MSH2, MSH3, MSH6, MUTYH, NBN, NF1, NF2, NTHL1, PALB2, PDGFRA, PHOX2B, PMS2, POLD1, POLE, POT1, PRKAR1A, PTCH1, PTEN, RAD50, RAD51C, RAD51D, RB1, RECQL4, RET, RUNX1,  SDHAF2, SDHA (sequence changes only), SDHB, SDHC, SDHD, SMAD4, SMARCA4, SMARCB1, SMARCE1, STK11, SUFU, TERC, TERT, TMEM127, TP53, TSC1, TSC2, VHL, WRN and WT1.     FAMILY HISTORY:  We obtained a detailed, 4-generation family history.  Significant diagnoses are listed below: Family History  Problem Relation Age of Onset   COPD Mother    Hypertension Mother    Heart disease Father     Briana Soto has 4 daughters (25, 52, 18, 39). She has two full sisters (71 and 58), no history of cancer. She has 1 maternal half brother (62) and 1 paternal half brother, 2 paternal half sisters, no cancers.   Briana Soto mother is living at 56, no history of cancer. Patient had 5 maternal uncles, 2 aunts, no cancers. Maternal grandmother died at 49, grandfather died over 54.    Briana Soto father is in his late 32s, no history of cancer. Patient had 2 paternal uncles, 2 paternal aunts, no cancers. Paternal grandmother died at 68 and grandfather died over 79.   Briana Soto is unaware of previous family history of genetic testing for hereditary cancer risks. There is no reported Ashkenazi Jewish ancestry. There is no known consanguinity.      GENETIC TEST RESULTS: Genetic testing reported out on 02/13/2021 through the [facility] Multi-Cancer+RNA cancer panel found no pathogenic mutations.   The Multi-Cancer Panel + RNA offered by [facility] includes sequencing and/or deletion duplication testing of the following 84 genes: AIP, ALK, APC, ATM, AXIN2,BAP1,  BARD1, BLM, BMPR1A, BRCA1, BRCA2, BRIP1, CASR, CDC73, CDH1, CDK4, CDKN1B, CDKN1C, CDKN2A (p14ARF), CDKN2A (p16INK4a), CEBPA, CHEK2, CTNNA1, DICER1, DIS3L2, EGFR (c.2369C>T, p.Thr790Met variant only), EPCAM (Deletion/duplication testing only), FH, FLCN, GATA2, GPC3, GREM1 (Promoter region deletion/duplication testing only), HOXB13 (c.251G>A, p.Gly84Glu), HRAS, KIT, MAX, MEN1, MET, MITF (c.952G>A, p.Glu318Lys variant only), MLH1, MSH2, MSH3, MSH6, MUTYH, NBN,  NF1, NF2, NTHL1, PALB2, PDGFRA, PHOX2B, PMS2, POLD1, POLE, POT1, PRKAR1A, PTCH1, PTEN, RAD50, RAD51C, RAD51D, RB1, RECQL4, RET, RUNX1, SDHAF2, SDHA (sequence changes only), SDHB, SDHC, SDHD, SMAD4, SMARCA4, SMARCB1, SMARCE1, STK11, SUFU, TERC, TERT, TMEM127, TP53, TSC1, TSC2, VHL, WRN and WT1.   The test report has been scanned into EPIC and is located under the Molecular Pathology section of the Results Review tab.  A portion of the result report is included below for reference.     We discussed that because current genetic testing is not perfect, it is possible there may be a gene mutation in one of these genes that current testing cannot detect, but that chance is small.  There could be another gene that has not yet been discovered, or that we have not yet tested, that is responsible for the cancer diagnoses in the family. It is also possible there is a hereditary cause for the cancer in the family that Briana Soto did not inherit and therefore was not identified in her testing.  Therefore, it is important to remain in touch with cancer genetics in the future so that we can continue to offer Briana Soto the most up to date genetic testing.   Genetic testing did identify 4 Variants of uncertain significance (VUS) - one in the ATM gene called c.1838T>G, a second in the EGFR gene called c.2884C>G, a third in the POLD1 gene called c.1072C>T and a fourth in RUNX1 called c.872T>C.  At this time, it is unknown if these variants are associated with increased cancer risk or if they are normal findings, but most variants such as these get reclassified to being inconsequential. They should  not be used to make medical management decisions. With time, we suspect the lab will determine the significance of these variants, if any. If we do learn more about them, we will try to contact Briana Soto to discuss it further. However, it is important to stay in touch with [place] periodically and keep the address and phone number  up to date.  ADDITIONAL GENETIC TESTING: We discussed with Briana Soto that her genetic testing was fairly extensive.  If there are genes identified to increase cancer risk that can be analyzed in the future, we would be happy to discuss and coordinate this testing at that time.    CANCER SCREENING RECOMMENDATIONS: Briana Soto test result is considered negative (normal).  This means that we have not identified a hereditary cause for her  personal and family history of cancer at this time. Most cancers happen by chance and this negative test suggests that her cancer may fall into this category.    While reassuring, this does not definitively rule out a hereditary predisposition to cancer. It is still possible that there could be genetic mutations that are undetectable by current technology. There could be genetic mutations in genes that have not been tested or identified to increase cancer risk.  Therefore, it is recommended she continue to follow the cancer management and screening guidelines provided by her oncology and primary healthcare provider.   An individual's cancer risk and medical management are not determined by genetic test results alone. Overall cancer risk assessment incorporates additional factors, including personal medical history, family history, and any available genetic information that may result in a personalized plan for cancer prevention and surveillance.  RECOMMENDATIONS FOR FAMILY MEMBERS:  Relatives in this family might be at some increased risk of developing cancer, over the general population risk, simply due to the family history of cancer.  We recommended female relatives in this family have a yearly mammogram beginning at age 104, or 65 years younger than the earliest onset of cancer, an annual clinical breast exam, and perform monthly breast self-exams. Female relatives in this family should also have a gynecological exam as recommended by their primary provider.  All  family members should be referred for colonoscopy starting at age 62.   FOLLOW-UP: Lastly, we discussed with Briana Soto that cancer genetics is a rapidly advancing field and it is possible that new genetic tests will be appropriate for her and/or her family members in the future. We encouraged her to remain in contact with cancer genetics on an annual basis so we can update her personal and family histories and let her know of advances in cancer genetics that may benefit this family.   Our contact number was provided. Briana Soto questions were answered to her satisfaction, and she knows she is welcome to call us at anytime with additional questions or concerns.   Faith Rogue, MS, [facility] Genetic Counselor [email or web address] Phone: [phone]

## 2021-02-28 NOTE — Progress Notes (Signed)
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  INTERVAL HISTORY: Patient returns to clinic today for further evaluation and consideration of cycle 4 of Adriamycin and Cytoxan.  She has increased mouth pain and sores this week, but otherwise feels well and is tolerating her treatments without significant side effects.  She has no neurologic complaints.  She denies any recent fevers or illnesses.  She has a fair appetite, but denies weight loss.  She has no chest pain, shortness of breath, cough, or hemoptysis.  She denies any nausea, vomiting, constipation, or diarrhea.  She has no urinary complaints.  Patient offers no further specific complaints today.  REVIEW OF SYSTEMS:   Review of Systems  Constitutional: Negative.  Negative for chills, fever and malaise/fatigue.  Respiratory: Negative.  Negative for cough, hemoptysis and shortness of breath.   Cardiovascular: Negative.  Negative for chest pain and leg swelling.  Gastrointestinal: Negative.  Negative for abdominal pain and nausea.  Genitourinary: Negative.  Negative for dysuria.  Musculoskeletal: Negative.  Negative for back pain.  Skin: Negative.  Negative for rash.  Neurological: Negative.  Negative for dizziness, focal weakness, weakness and headaches.  Psychiatric/Behavioral: Negative.  The patient is not nervous/anxious and does not have insomnia.    As per HPI. Otherwise, a complete review of systems is negative.  PAST MEDICAL HISTORY: Past Medical History:  Diagnosis Date   Anxiety    Headache    Hypertension    Noncompliance     PAST SURGICAL HISTORY: Past Surgical History:  Procedure  Laterality Date   BREAST BIOPSY Right 12/24/2020   mass, 9:00 8 cmfn venus marker, path pending   BREAST BIOPSY Left 12/24/2020   mass 3:00 retroareolar, ribbon, path pending   BREAST BIOPSY Left 12/24/2020   mass 3:00 5cmfn, heart marker, path pending   BREAST BIOPSY Left 12/24/2020   axilla LN, hyrdomarker shape 3 coil, path pending   PORTACATH PLACEMENT Right 01/01/2021   Procedure: INSERTION PORT-A-CATH;  Surgeon: Ronny Bacon, MD;  Location: [facility] ORS;  Service: General;  Laterality: Right;   TUBAL LIGATION      FAMILY HISTORY: Family History  Problem Relation Age of Onset   COPD Mother    Hypertension Mother    Heart disease Father     ADVANCED DIRECTIVES (Y/N):  N  HEALTH MAINTENANCE: Social History   Tobacco Use   Smoking status: Every Day    Packs/day: 1.50    Types: Cigarettes   Smokeless tobacco: Never  Vaping Use   Vaping Use: Never used  Substance Use Topics   Alcohol use: Yes    Alcohol/week: 14.0 standard drinks    Types: 14 Cans of beer per week    Comment: at least 2 beers daily   Drug use: No     Colonoscopy:  PAP:  Bone density:  Lipid panel:  Allergies  Allergen Reactions   Codeine Itching and Nausea And Vomiting    Current Outpatient Medications  Medication Sig Dispense Refill   acetaminophen (TYLENOL) 325 MG tablet Take 650 mg by mouth every 6 (six) hours as needed.     ALPRAZolam (XANAX) 0.25 MG tablet Take 1 tablet (0.25 mg total) by mouth 2 (two) times daily  as needed for anxiety. 60 tablet 2   bacitracin ointment Apply 1 application topically 2 (two) times daily. 120 g 0   diphenhydrAMINE HCl (BENADRYL ALLERGY PO) Take by mouth.     diphenhydrAMINE-zinc acetate (BENADRYL EXTRA STRENGTH) cream Apply 1 application topically 3 (three) times daily as needed for itching. 28.4 g 0   ibuprofen (ADVIL) 800 MG tablet Take 1 tablet (800 mg total) by mouth every 8 (eight) hours as needed. 30 tablet 0   labetalol (NORMODYNE) 100 MG tablet  Take 1 tablet (100 mg total) by mouth 2 (two) times daily. 60 tablet 1   lidocaine-prilocaine (EMLA) cream Apply to affected area once 30 g 3   loratadine (CLARITIN) 10 MG tablet Take 10 mg by mouth daily.     magic mouthwash w/lidocaine SOLN Take 5 mLs by mouth 2 (two) times daily as needed for mouth pain. 5 mL 0   ondansetron (ZOFRAN) 8 MG tablet Take 1 tablet (8 mg total) by mouth 2 (two) times daily. 60 tablet 2   prochlorperazine (COMPAZINE) 10 MG tablet Take 1 tablet (10 mg total) by mouth every 6 (six) hours as needed for nausea or vomiting. 60 tablet 2   No current facility-administered medications for this visit.    OBJECTIVE: Vitals:   03/04/21 0906  BP: (!) 141/108  Pulse: 87  Resp: 16  Temp: 97.9 F (36.6 C)  SpO2: 98%     Body mass index is 23.62 kg/m.    ECOG FS:0 - Asymptomatic  General: Well-developed, well-nourished, no acute distress. Eyes: Pink conjunctiva, anicteric sclera. HEENT: Normocephalic, moist mucous membranes. Lungs: No audible wheezing or coughing. Heart: Regular rate and rhythm. Abdomen: Soft, nontender, no obvious distention. Musculoskeletal: No edema, cyanosis, or clubbing. Neuro: Alert, answering all questions appropriately. Cranial nerves grossly intact. Skin: No rashes or petechiae noted. Psych: Normal affect.   LAB RESULTS:  Lab Results  Component Value Date   NA 133 (L) 03/04/2021   K 4.1 03/04/2021   CL 101 03/04/2021   CO2 25 03/04/2021   GLUCOSE 98 03/04/2021   BUN 8 03/04/2021   CREATININE 0.74 03/04/2021   CALCIUM 9.2 03/04/2021   PROT 7.1 03/04/2021   ALBUMIN 3.9 03/04/2021   AST 13 (L) 03/04/2021   ALT 11 03/04/2021   ALKPHOS 109 03/04/2021   BILITOT 0.4 03/04/2021   GFRNONAA >60 03/04/2021   GFRAA >60 12/01/2017    Lab Results  Component Value Date   WBC 15.2 (H) 03/04/2021   NEUTROABS 11.0 (H) 03/04/2021   HGB 10.7 (L) 03/04/2021   HCT 32.9 (L) 03/04/2021   MCV 93.5 03/04/2021   PLT 144 (L) 03/04/2021      STUDIES: No results found.  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  PLAN:    1.  Bilateral breast cancer: Case discussed with pathology and this appears to be 2 distinct breast cancer primaries in her right and left breast both of which are ER/PR, HER2 negative malignancies.  CT scan completed on December 30, 2020 reviewed independently and reported as above did not reveal any metastatic disease.  MUGA scan on January 08, 2021 reported an EF of 57.3%.  Patient has also had port placement.  Previously, surgery recommended upfront systemic therapy. Given patient's age, she will also require genetic testing.  Plan to give neoadjuvant Adriamycin plus Cytoxan every 2 weeks with Neulasta support for 4 cycles followed by weekly Taxol for 12 cycles.  Patient also may benefit from adjuvant Xeloda.  Proceed with cycle  4 today.  Return to clinic tomorrow for Neulasta and then in 2 weeks for further evaluation and consideration of cycle 1 of 12 of weekly Taxol. 2.  Nausea: Patient does not complain of this today.  Continue Compazine as needed. 3.  Anxiety/insomnia: Continue Xanax as needed.  Patient was given a refill today. 4.  Leukocytosis: Likely secondary to Neulasta, monitor.   5.  Anemia: Patient's hemoglobin trended down to 10.7, monitor. 6.  Thrombocytopenia: Mild, monitor. 7.  Mouth sores: Patient was given a prescription for Magic mouthwash.  Patient expressed understanding and was in agreement with this plan. She also understands that She can call clinic at any time with any questions, concerns, or complaints.    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HCC) Staging form: Breast, AJCC 8th Edition - Clinical stage from 01/12/2021: Stage IIB (cT2, cN1, cM0, G3, ER+, PR+, HER2-) - Signed by Lloyd Huger, MD on 01/12/2021 Stage prefix: Initial diagnosis Histologic grading system: 3 grade system  Invasive ductal carcinoma of right breast [facility]) Staging  form: Breast, AJCC 8th Edition - Clinical stage from 01/12/2021: Stage IB (cT2, cN0, cM0, G2, ER+, PR+, HER2-) - Signed by Lloyd Huger, MD on 01/12/2021 Stage prefix: Initial diagnosis Histologic grading system: 3 grade system  Lloyd Huger, MD   03/04/2021 4:34 PM

## 2021-03-04 ENCOUNTER — Inpatient Hospital Stay (HOSPITAL_BASED_OUTPATIENT_CLINIC_OR_DEPARTMENT_OTHER): Payer: Medicaid Other | Admitting: Oncology

## 2021-03-04 ENCOUNTER — Other Ambulatory Visit: Payer: Self-pay

## 2021-03-04 ENCOUNTER — Inpatient Hospital Stay: Payer: Medicaid Other

## 2021-03-04 ENCOUNTER — Inpatient Hospital Stay: Payer: Medicaid Other | Attending: Oncology

## 2021-03-04 VITALS — BP 146/102 | HR 85

## 2021-03-04 VITALS — BP 141/108 | HR 87 | Temp 97.9°F | Resp 16 | Wt 137.6 lb

## 2021-03-04 DIAGNOSIS — F419 Anxiety disorder, unspecified: Secondary | ICD-10-CM | POA: Diagnosis not present

## 2021-03-04 DIAGNOSIS — C50912 Malignant neoplasm of unspecified site of left female breast: Secondary | ICD-10-CM

## 2021-03-04 DIAGNOSIS — Z5111 Encounter for antineoplastic chemotherapy: Secondary | ICD-10-CM | POA: Insufficient documentation

## 2021-03-04 DIAGNOSIS — Z170 Estrogen receptor positive status [ER+]: Secondary | ICD-10-CM | POA: Insufficient documentation

## 2021-03-04 DIAGNOSIS — Z5189 Encounter for other specified aftercare: Secondary | ICD-10-CM | POA: Insufficient documentation

## 2021-03-04 DIAGNOSIS — F17210 Nicotine dependence, cigarettes, uncomplicated: Secondary | ICD-10-CM | POA: Insufficient documentation

## 2021-03-04 DIAGNOSIS — C50811 Malignant neoplasm of overlapping sites of right female breast: Secondary | ICD-10-CM | POA: Diagnosis present

## 2021-03-04 DIAGNOSIS — D649 Anemia, unspecified: Secondary | ICD-10-CM | POA: Insufficient documentation

## 2021-03-04 DIAGNOSIS — D72829 Elevated white blood cell count, unspecified: Secondary | ICD-10-CM | POA: Diagnosis not present

## 2021-03-04 DIAGNOSIS — G4700 Insomnia, unspecified: Secondary | ICD-10-CM | POA: Diagnosis not present

## 2021-03-04 DIAGNOSIS — C50812 Malignant neoplasm of overlapping sites of left female breast: Secondary | ICD-10-CM | POA: Insufficient documentation

## 2021-03-04 DIAGNOSIS — Z79899 Other long term (current) drug therapy: Secondary | ICD-10-CM | POA: Insufficient documentation

## 2021-03-04 DIAGNOSIS — C50911 Malignant neoplasm of unspecified site of right female breast: Secondary | ICD-10-CM

## 2021-03-04 LAB — COMPREHENSIVE METABOLIC PANEL
ALT: 11 U/L (ref 0–44)
AST: 13 U/L — ABNORMAL LOW (ref 15–41)
Albumin: 3.9 g/dL (ref 3.5–5.0)
Alkaline Phosphatase: 109 U/L (ref 38–126)
Anion gap: 7 (ref 5–15)
BUN: 8 mg/dL (ref 6–20)
CO2: 25 mmol/L (ref 22–32)
Calcium: 9.2 mg/dL (ref 8.9–10.3)
Chloride: 101 mmol/L (ref 98–111)
Creatinine, Ser: 0.74 mg/dL (ref 0.44–1.00)
GFR, Estimated: >60 mL/min (ref >60)
Glucose, Bld: 98 mg/dL (ref 70–99)
Potassium: 4.1 mmol/L (ref 3.5–5.1)
Sodium: 133 mmol/L — ABNORMAL LOW (ref 135–145)
Total Bilirubin: 0.4 mg/dL (ref 0.3–1.2)
Total Protein: 7.1 g/dL (ref 6.5–8.1)

## 2021-03-04 LAB — CBC WITH DIFFERENTIAL/PLATELET
Abs Immature Granulocytes: 1.04 10*3/uL — ABNORMAL HIGH (ref 0.00–0.07)
Basophils Absolute: 0.1 10*3/uL (ref 0.0–0.1)
Basophils Relative: 1 %
Eosinophils Absolute: 0 10*3/uL (ref 0.0–0.5)
Eosinophils Relative: 0 %
HCT: 32.9 % — ABNORMAL LOW (ref 36.0–46.0)
Hemoglobin: 10.7 g/dL — ABNORMAL LOW (ref 12.0–15.0)
Immature Granulocytes: 7 %
Lymphocytes Relative: 10 %
Lymphs Abs: 1.5 10*3/uL (ref 0.7–4.0)
MCH: 30.4 pg (ref 26.0–34.0)
MCHC: 32.5 g/dL (ref 30.0–36.0)
MCV: 93.5 fL (ref 80.0–100.0)
Monocytes Absolute: 1.6 10*3/uL — ABNORMAL HIGH (ref 0.1–1.0)
Monocytes Relative: 10 %
Neutro Abs: 11 10*3/uL — ABNORMAL HIGH (ref 1.7–7.7)
Neutrophils Relative %: 72 %
Platelets: 144 10*3/uL — ABNORMAL LOW (ref 150–400)
RBC: 3.52 MIL/uL — ABNORMAL LOW (ref 3.87–5.11)
RDW: 18.6 % — ABNORMAL HIGH (ref 11.5–15.5)
WBC: 15.2 10*3/uL — ABNORMAL HIGH (ref 4.0–10.5)
nRBC: 0.5 % — ABNORMAL HIGH (ref 0.0–0.2)

## 2021-03-04 MED ORDER — MAGIC MOUTHWASH W/LIDOCAINE: 5.0000 mL | Freq: Two times a day (BID) | ORAL | 0 refills | Status: AC | PRN

## 2021-03-04 MED ORDER — SODIUM CHLORIDE 0.9 % IV SOLN
150.0000 mg | Freq: Once | INTRAVENOUS | Status: AC
  Administered 2021-03-04: 150 mg via INTRAVENOUS
  Filled 2021-03-04: qty 150

## 2021-03-04 MED ORDER — SODIUM CHLORIDE 0.9 % IV SOLN
10.0000 mg | Freq: Once | INTRAVENOUS | Status: AC
  Administered 2021-03-04: 10 mg via INTRAVENOUS
  Filled 2021-03-04: qty 10

## 2021-03-04 MED ORDER — DOXORUBICIN HCL CHEMO IV INJECTION 2 MG/ML
60.0000 mg/m2 | Freq: Once | INTRAVENOUS | Status: AC
  Administered 2021-03-04: 100 mg via INTRAVENOUS
  Filled 2021-03-04: qty 50

## 2021-03-04 MED ORDER — SODIUM CHLORIDE 0.9 % IV SOLN
600.0000 mg/m2 | Freq: Once | INTRAVENOUS | Status: AC
  Administered 2021-03-04: 1000 mg via INTRAVENOUS
  Filled 2021-03-04: qty 50

## 2021-03-04 MED ORDER — PALONOSETRON HCL INJECTION 0.25 MG/5ML
0.2500 mg | Freq: Once | INTRAVENOUS | Status: AC
  Administered 2021-03-04: 0.25 mg via INTRAVENOUS
  Filled 2021-03-04: qty 5

## 2021-03-04 MED ORDER — HEPARIN SOD (PORK) LOCK FLUSH 100 UNIT/ML IV SOLN
500.0000 [IU] | Freq: Once | INTRAVENOUS | Status: AC | PRN
  Filled 2021-03-04: qty 5

## 2021-03-04 MED ORDER — SODIUM CHLORIDE 0.9 % IV SOLN
Freq: Once | INTRAVENOUS | Status: AC
  Filled 2021-03-04: qty 250

## 2021-03-04 MED ORDER — HEPARIN SOD (PORK) LOCK FLUSH 100 UNIT/ML IV SOLN
INTRAVENOUS | Status: AC
  Administered 2021-03-04: 500 [IU]
  Filled 2021-03-04: qty 5

## 2021-03-04 NOTE — Patient Instructions (Signed)
[facility] AT [place]  Discharge Instructions: Thank you for choosing [facility] to provide your oncology and hematology care.  If you have a lab appointment with the [facility], please go directly to the [facility] and check in at the registration area.  Wear comfortable clothing and clothing appropriate for easy access to any Portacath or PICC line.   We strive to give you quality time with your provider. You may need to reschedule your appointment if you arrive late (15 or more minutes).  Arriving late affects you and other patients whose appointments are after yours.  Also, if you miss three or more appointments without notifying the office, you may be dismissed from the clinic at the providers discretion.      For prescription refill requests, have your pharmacy contact our office and allow 72 hours for refills to be completed.    Today you received the following chemotherapy and/or immunotherapy agents cytoxan, adriamycin      To help prevent nausea and vomiting after your treatment, we encourage you to take your nausea medication as directed.  BELOW ARE SYMPTOMS THAT SHOULD BE REPORTED IMMEDIATELY: *FEVER GREATER THAN 100.4 F (38 C) OR HIGHER *CHILLS OR SWEATING *NAUSEA AND VOMITING THAT IS NOT CONTROLLED WITH YOUR NAUSEA MEDICATION *UNUSUAL SHORTNESS OF BREATH *UNUSUAL BRUISING OR BLEEDING *URINARY PROBLEMS (pain or burning when urinating, or frequent urination) *BOWEL PROBLEMS (unusual diarrhea, constipation, pain near the anus) TENDERNESS IN MOUTH AND THROAT WITH OR WITHOUT PRESENCE OF ULCERS (sore throat, sores in mouth, or a toothache) UNUSUAL RASH, SWELLING OR PAIN  UNUSUAL VAGINAL DISCHARGE OR ITCHING   Items with * indicate a potential emergency and should be followed up as soon as possible or go to the Emergency Department if any problems should occur.  Please show the CHEMOTHERAPY ALERT CARD or IMMUNOTHERAPY ALERT CARD at  check-in to the Emergency Department and triage nurse.  Should you have questions after your visit or need to cancel or reschedule your appointment, please contact [facility] CANCER [place] AT [place]  [phone] and follow the prompts.  Office hours are 8:00 a.m. to 4:30 p.m. Monday - Friday. Please note that voicemails left after 4:00 p.m. may not be returned until the following business day.  We are closed weekends and major holidays. You have access to a nurse at all times for urgent questions. Please call the main number to the clinic [phone] and follow the prompts.  For any non-urgent questions, you may also contact your provider using MyChart. We now offer e-Visits for anyone 40 and older to request care online for non-urgent symptoms. For details visit [email or web address].   Also download the MyChart app! Go to the app store, search "MyChart", open the app, select , and log in with your MyChart username and password.  Due to Covid, a mask is required upon entering the hospital/clinic. If you do not have a mask, one will be given to you upon arrival. For doctor visits, patients may have 1 support person aged 41 or older with them. For treatment visits, patients cannot have anyone with them due to current Covid guidelines and our immunocompromised population.

## 2021-03-04 NOTE — Progress Notes (Signed)
Vitals reviewed with MD per MD to proceed with treatment.   Lupe Handley [employer]

## 2021-03-04 NOTE — Progress Notes (Signed)
Pt reports pain to the tongue. States "it feels like the taste buds are falling off." Black spot noted to left side of tongue. Pt reports symptoms started this week. Denies any alleviating or aggravating factors.

## 2021-03-05 ENCOUNTER — Encounter: Payer: Self-pay | Admitting: Licensed Clinical Social Worker

## 2021-03-05 ENCOUNTER — Inpatient Hospital Stay: Payer: Medicaid Other

## 2021-03-05 DIAGNOSIS — C50912 Malignant neoplasm of unspecified site of left female breast: Secondary | ICD-10-CM

## 2021-03-05 DIAGNOSIS — C50911 Malignant neoplasm of unspecified site of right female breast: Secondary | ICD-10-CM

## 2021-03-05 DIAGNOSIS — Z5111 Encounter for antineoplastic chemotherapy: Secondary | ICD-10-CM | POA: Diagnosis not present

## 2021-03-05 MED ORDER — PEGFILGRASTIM-CBQV 6 MG/0.6ML ~~LOC~~ SOSY
6.0000 mg | PREFILLED_SYRINGE | Freq: Once | SUBCUTANEOUS | Status: AC
  Administered 2021-03-05: 6 mg via SUBCUTANEOUS
  Filled 2021-03-05: qty 0.6

## 2021-03-05 NOTE — Progress Notes (Signed)
[facility]  Initial Assessment   Briana Soto is a 46 y.o. year old female contacted by phone. Clinical Social Work was referred by A. West Carbo RN for assessment of psychosocial needs.   SDOH (Social Determinants of Health) assessments performed: No   Distress Screen completed: No ONCBCN DISTRESS SCREENING 12/29/2020  Screening Type Initial Screening  Distress experienced in past week (1-10) 4      Family/Social Information:  Housing Arrangement: patient lives with spouse  Victorino Dike   [phone] (Mobile) Family members/support persons in your life? Family and Friends/Colleagues Transportation concerns: no  Employment: Out of work due to pain. 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 Patient enjoys  N/A Current coping skills/ strengths: Ability for insight , Average or above average intelligence , Capable of independent living , and Communication skills     SUMMARY: Current SDOH Barriers:  Financial constraints related to unemployment due to work  Clinical Social Work Clinical Goal(s):  patient will follow up with Hotel manager, Arts development officer and Education officer, museum* as directed by SW  Interventions: Discussed common feeling and emotions when being diagnosed with cancer, and the importance of support during treatment Informed patient of the support team roles and support services at [facility] Provided CSW  contact information and encouraged patient to call with any questions or concerns Referred patient to Armed forces training and education officer, Provided education regarding disability , and CSW role in patient care.  CSW will email links to information for non-profit breast cancer financial assistance.   Follow Up Plan: Patient will contact CSW with any support or resource needs, CSW will follow-up with patient by phone , and Patient will contact CSW after reviewing financial programs from non-profit breast cancer agencies. Patient verbalizes understanding of plan: Yes    [employer] , LCSW

## 2021-03-11 NOTE — Progress Notes (Signed)
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  INTERVAL HISTORY: Patient returns to clinic today for further evaluation and consideration of cycle 1 of 12 of weekly Taxol.  She is anxious, but otherwise feels well.  She has no neurologic complaints.  She denies any recent fevers or illnesses.  She has a fair appetite, but denies weight loss.  She has no chest pain, shortness of breath, cough, or hemoptysis.  She denies any nausea, vomiting, constipation, or diarrhea.  She has no urinary complaints.  Patient offers no further specific complaints today.  REVIEW OF SYSTEMS:   Review of Systems  Constitutional: Negative.  Negative for chills, fever and malaise/fatigue.  Respiratory: Negative.  Negative for cough, hemoptysis and shortness of breath.   Cardiovascular: Negative.  Negative for chest pain and leg swelling.  Gastrointestinal: Negative.  Negative for abdominal pain and nausea.  Genitourinary: Negative.  Negative for dysuria.  Musculoskeletal: Negative.  Negative for back pain.  Skin: Negative.  Negative for rash.  Neurological: Negative.  Negative for dizziness, focal weakness, weakness and headaches.  Psychiatric/Behavioral:  The patient is nervous/anxious. The patient does not have insomnia.    As per HPI. Otherwise, a complete review of systems is negative.  PAST MEDICAL HISTORY: Past Medical History:  Diagnosis Date   Anxiety    Headache    Hypertension    Noncompliance     PAST SURGICAL HISTORY: Past Surgical History:  Procedure Laterality Date   BREAST BIOPSY Right 12/24/2020   mass, 9:00 8 cmfn venus marker, path pending   BREAST BIOPSY  Left 12/24/2020   mass 3:00 retroareolar, ribbon, path pending   BREAST BIOPSY Left 12/24/2020   mass 3:00 5cmfn, heart marker, path pending   BREAST BIOPSY Left 12/24/2020   axilla LN, hyrdomarker shape 3 coil, path pending   PORTACATH PLACEMENT Right 01/01/2021   Procedure: INSERTION PORT-A-CATH;  Surgeon: Ronny Bacon, MD;  Location: [facility] ORS;  Service: General;  Laterality: Right;   TUBAL LIGATION      FAMILY HISTORY: Family History  Problem Relation Age of Onset   COPD Mother    Hypertension Mother    Heart disease Father     ADVANCED DIRECTIVES (Y/N):  N  HEALTH MAINTENANCE: Social History   Tobacco Use   Smoking status: Every Day    Packs/day: 1.50    Types: Cigarettes   Smokeless tobacco: Never  Vaping Use   Vaping Use: Never used  Substance Use Topics   Alcohol use: Yes    Alcohol/week: 14.0 standard drinks    Types: 14 Cans of beer per week    Comment: at least 2 beers daily   Drug use: No     Colonoscopy:  PAP:  Bone density:  Lipid panel:  Allergies  Allergen Reactions   Codeine Itching and Nausea And Vomiting    Current Outpatient Medications  Medication Sig Dispense Refill   acetaminophen (TYLENOL) 325 MG tablet Take 650 mg by mouth every 6 (six) hours as needed.     ALPRAZolam (XANAX) 0.25 MG tablet Take 1 tablet (0.25 mg total) by mouth 2 (two) times daily as needed for anxiety. 60 tablet 2   bacitracin ointment Apply 1 application topically  2 (two) times daily. 120 g 0   diphenhydrAMINE HCl (BENADRYL ALLERGY PO) Take by mouth.     diphenhydrAMINE-zinc acetate (BENADRYL EXTRA STRENGTH) cream Apply 1 application topically 3 (three) times daily as needed for itching. 28.4 g 0   ibuprofen (ADVIL) 800 MG tablet Take 1 tablet (800 mg total) by mouth every 8 (eight) hours as needed. 30 tablet 0   labetalol (NORMODYNE) 100 MG tablet Take 1 tablet (100 mg total) by mouth 2 (two) times daily. 60 tablet 1   lidocaine-prilocaine (EMLA) cream Apply to  affected area once 30 g 3   loratadine (CLARITIN) 10 MG tablet Take 10 mg by mouth daily.     magic mouthwash w/lidocaine SOLN Take 5 mLs by mouth 2 (two) times daily as needed for mouth pain. 5 mL 0   ondansetron (ZOFRAN) 8 MG tablet Take 1 tablet (8 mg total) by mouth 2 (two) times daily. 60 tablet 2   prednisoLONE (ORAPRED) 15 MG/5ML solution Take by mouth.     prochlorperazine (COMPAZINE) 10 MG tablet Take 1 tablet (10 mg total) by mouth every 6 (six) hours as needed for nausea or vomiting. 60 tablet 2   No current facility-administered medications for this visit.    OBJECTIVE: Vitals:   03/18/21 0918  BP: (!) 149/108  Pulse: 95  Resp: 18  Temp: 97.7 F (36.5 C)  SpO2: 100%     Body mass index is 23.19 kg/m.    ECOG FS:0 - Asymptomatic  General: Well-developed, well-nourished, no acute distress. Eyes: Pink conjunctiva, anicteric sclera. HEENT: Normocephalic, moist mucous membranes. Lungs: No audible wheezing or coughing. Heart: Regular rate and rhythm. Abdomen: Soft, nontender, no obvious distention. Musculoskeletal: No edema, cyanosis, or clubbing. Neuro: Alert, answering all questions appropriately. Cranial nerves grossly intact. Skin: No rashes or petechiae noted. Psych: Normal affect.   LAB RESULTS:  Lab Results  Component Value Date   NA 138 03/18/2021   K 3.6 03/18/2021   CL 101 03/18/2021   CO2 24 03/18/2021   GLUCOSE 119 (H) 03/18/2021   BUN <5 (L) 03/18/2021   CREATININE 0.77 03/18/2021   CALCIUM 9.2 03/18/2021   PROT 7.5 03/18/2021   ALBUMIN 3.9 03/18/2021   AST 15 03/18/2021   ALT 10 03/18/2021   ALKPHOS 138 (H) 03/18/2021   BILITOT 0.4 03/18/2021   GFRNONAA >60 03/18/2021   GFRAA >60 12/01/2017    Lab Results  Component Value Date   WBC 13.4 (H) 03/18/2021   NEUTROABS 9.6 (H) 03/18/2021   HGB 11.2 (L) 03/18/2021   HCT 34.3 (L) 03/18/2021   MCV 96.3 03/18/2021   PLT 214 03/18/2021     STUDIES: No results found.  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  PLAN:    1.  Bilateral breast cancer: Case discussed with pathology and this appears to be 2 distinct breast cancer primaries in her right and left breast both of which are ER/PR, HER2 negative malignancies.  CT scan completed on December 30, 2020 reviewed independently and reported as above did not reveal any metastatic disease.  MUGA scan on January 08, 2021 reported an EF of 57.3%.  Patient has also had port placement.  Previously, surgery recommended upfront systemic therapy. Given patient's age, she will also require genetic testing.  Plan to give neoadjuvant Adriamycin plus Cytoxan every 2 weeks with Neulasta support for 4 cycles followed by weekly Taxol for 12 cycles.  Patient also may benefit from adjuvant Xeloda.  Patient completed cycle 4 of Adriamycin  and Cytoxan on March 05, 2021.  Proceed with cycle 1 of 12 of weekly Taxol.  Return to clinic in 1 week for further evaluation and consideration of cycle 2.   2.  Nausea: Patient does not complain of this today.  Continue Compazine as needed. 3.  Anxiety/insomnia: Continue Xanax as needed.   4.  Leukocytosis: Improved.  Likely secondary to Neulasta, monitor.   5.  Anemia: Improved.  Patient hemoglobin is 11.2.   6.  Thrombocytopenia: Resolved. 7.  Mouth sores: Patient does not complain of this today.  Use Magic mouthwash as needed.  Patient expressed understanding and was in agreement with this plan. She also understands that She can call clinic at any time with any questions, concerns, or complaints.    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[place]) Staging form: Breast, AJCC 8th Edition - Clinical stage from 01/12/2021: Stage IIB (cT2, cN1, cM0, G3, ER+, PR+, HER2-) - Signed by Lloyd Huger, MD on 01/12/2021 Stage prefix: Initial diagnosis Histologic grading system: 3 grade system  Invasive ductal carcinoma of right breast [facility]) Staging form: Breast, AJCC 8th  Edition - Clinical stage from 01/12/2021: Stage IB (cT2, cN0, cM0, G2, ER+, PR+, HER2-) - Signed by Lloyd Huger, MD on 01/12/2021 Stage prefix: Initial diagnosis Histologic grading system: 3 grade system  Lloyd Huger, MD   03/19/2021 9:30 AM

## 2021-03-18 ENCOUNTER — Inpatient Hospital Stay: Payer: Medicaid Other

## 2021-03-18 ENCOUNTER — Other Ambulatory Visit: Payer: Self-pay

## 2021-03-18 ENCOUNTER — Inpatient Hospital Stay (HOSPITAL_BASED_OUTPATIENT_CLINIC_OR_DEPARTMENT_OTHER): Payer: Medicaid Other | Admitting: Oncology

## 2021-03-18 ENCOUNTER — Encounter: Payer: Self-pay | Admitting: Oncology

## 2021-03-18 VITALS — BP 176/118 | HR 91

## 2021-03-18 VITALS — BP 149/108 | HR 95 | Temp 97.7°F | Resp 18 | Ht 64.0 in | Wt 135.1 lb

## 2021-03-18 DIAGNOSIS — C50912 Malignant neoplasm of unspecified site of left female breast: Secondary | ICD-10-CM | POA: Diagnosis not present

## 2021-03-18 DIAGNOSIS — Z5111 Encounter for antineoplastic chemotherapy: Secondary | ICD-10-CM | POA: Diagnosis not present

## 2021-03-18 DIAGNOSIS — C50911 Malignant neoplasm of unspecified site of right female breast: Secondary | ICD-10-CM

## 2021-03-18 LAB — COMPREHENSIVE METABOLIC PANEL
ALT: 10 U/L (ref 0–44)
AST: 15 U/L (ref 15–41)
Albumin: 3.9 g/dL (ref 3.5–5.0)
Alkaline Phosphatase: 138 U/L — ABNORMAL HIGH (ref 38–126)
Anion gap: 13 (ref 5–15)
BUN: <5 mg/dL — ABNORMAL LOW (ref 6–20)
CO2: 24 mmol/L (ref 22–32)
Calcium: 9.2 mg/dL (ref 8.9–10.3)
Chloride: 101 mmol/L (ref 98–111)
Creatinine, Ser: 0.77 mg/dL (ref 0.44–1.00)
GFR, Estimated: >60 mL/min (ref >60)
Glucose, Bld: 119 mg/dL — ABNORMAL HIGH (ref 70–99)
Potassium: 3.6 mmol/L (ref 3.5–5.1)
Sodium: 138 mmol/L (ref 135–145)
Total Bilirubin: 0.4 mg/dL (ref 0.3–1.2)
Total Protein: 7.5 g/dL (ref 6.5–8.1)

## 2021-03-18 LAB — CBC WITH DIFFERENTIAL/PLATELET
Abs Immature Granulocytes: 0.53 10*3/uL — ABNORMAL HIGH (ref 0.00–0.07)
Basophils Absolute: 0.1 10*3/uL (ref 0.0–0.1)
Basophils Relative: 0 %
Eosinophils Absolute: 0 10*3/uL (ref 0.0–0.5)
Eosinophils Relative: 0 %
HCT: 34.3 % — ABNORMAL LOW (ref 36.0–46.0)
Hemoglobin: 11.2 g/dL — ABNORMAL LOW (ref 12.0–15.0)
Immature Granulocytes: 4 %
Lymphocytes Relative: 13 %
Lymphs Abs: 1.7 10*3/uL (ref 0.7–4.0)
MCH: 31.5 pg (ref 26.0–34.0)
MCHC: 32.7 g/dL (ref 30.0–36.0)
MCV: 96.3 fL (ref 80.0–100.0)
Monocytes Absolute: 1.5 10*3/uL — ABNORMAL HIGH (ref 0.1–1.0)
Monocytes Relative: 11 %
Neutro Abs: 9.6 10*3/uL — ABNORMAL HIGH (ref 1.7–7.7)
Neutrophils Relative %: 72 %
Platelets: 214 10*3/uL (ref 150–400)
RBC: 3.56 MIL/uL — ABNORMAL LOW (ref 3.87–5.11)
RDW: 21 % — ABNORMAL HIGH (ref 11.5–15.5)
WBC: 13.4 10*3/uL — ABNORMAL HIGH (ref 4.0–10.5)
nRBC: 0.5 % — ABNORMAL HIGH (ref 0.0–0.2)

## 2021-03-18 MED ORDER — SODIUM CHLORIDE 0.9 % IV SOLN
80.0000 mg/m2 | Freq: Once | INTRAVENOUS | Status: AC
  Administered 2021-03-18: 132 mg via INTRAVENOUS
  Filled 2021-03-18: qty 22

## 2021-03-18 MED ORDER — ALTEPLASE 2 MG IJ SOLR
2.0000 mg | Freq: Once | INTRAMUSCULAR | Status: AC | PRN
  Administered 2021-03-18: 2 mg
  Filled 2021-03-18: qty 2

## 2021-03-18 MED ORDER — FAMOTIDINE IN NACL 20-0.9 MG/50ML-% IV SOLN
20.0000 mg | Freq: Once | INTRAVENOUS | Status: AC
  Administered 2021-03-18: 20 mg via INTRAVENOUS
  Filled 2021-03-18: qty 50

## 2021-03-18 MED ORDER — HEPARIN SOD (PORK) LOCK FLUSH 100 UNIT/ML IV SOLN
500.0000 [IU] | Freq: Once | INTRAVENOUS | Status: AC | PRN
  Administered 2021-03-18: 500 [IU]
  Filled 2021-03-18: qty 5

## 2021-03-18 MED ORDER — SODIUM CHLORIDE 0.9 % IV SOLN
Freq: Once | INTRAVENOUS | Status: AC
  Filled 2021-03-18: qty 250

## 2021-03-18 MED ORDER — SODIUM CHLORIDE 0.9 % IV SOLN
10.0000 mg | Freq: Once | INTRAVENOUS | Status: AC
  Administered 2021-03-18: 10 mg via INTRAVENOUS
  Filled 2021-03-18: qty 10

## 2021-03-18 MED ORDER — DIPHENHYDRAMINE HCL 50 MG/ML IJ SOLN
25.0000 mg | Freq: Once | INTRAMUSCULAR | Status: AC
  Administered 2021-03-18: 25 mg via INTRAVENOUS
  Filled 2021-03-18: qty 1

## 2021-03-18 NOTE — Patient Instructions (Signed)
[facility]  Discharge Instructions: °Thank you for choosing [facility] to provide your oncology and hematology care.  ° °If you have a lab appointment with the Cancer Center, please go directly to the Cancer Center and check in at the registration area. °  °Wear comfortable clothing and clothing appropriate for easy access to any Portacath or PICC line.  ° °We strive to give you quality time with your provider. You may need to reschedule your appointment if you arrive late (15 or more minutes).  Arriving late affects you and other patients whose appointments are after yours.  Also, if you miss three or more appointments without notifying the office, you may be dismissed from the clinic at the provider’s discretion.    °  °For prescription refill requests, have your pharmacy contact our office and allow 72 hours for refills to be completed.   ° °Today you received the following chemotherapy and/or immunotherapy agents     °  °To help prevent nausea and vomiting after your treatment, we encourage you to take your nausea medication as directed. ° °BELOW ARE SYMPTOMS THAT SHOULD BE REPORTED IMMEDIATELY: °*FEVER GREATER THAN 100.4 F (38 °C) OR HIGHER °*CHILLS OR SWEATING °*NAUSEA AND VOMITING THAT IS NOT CONTROLLED WITH YOUR NAUSEA MEDICATION °*UNUSUAL SHORTNESS OF BREATH °*UNUSUAL BRUISING OR BLEEDING °*URINARY PROBLEMS (pain or burning when urinating, or frequent urination) °*BOWEL PROBLEMS (unusual diarrhea, constipation, pain near the anus) °TENDERNESS IN MOUTH AND THROAT WITH OR WITHOUT PRESENCE OF ULCERS (sore throat, sores in mouth, or a toothache) °UNUSUAL RASH, SWELLING OR PAIN  °UNUSUAL VAGINAL DISCHARGE OR ITCHING  ° °Items with * indicate a potential emergency and should be followed up as soon as possible or go to the Emergency Department if any problems should occur. ° °Please show the CHEMOTHERAPY ALERT CARD or IMMUNOTHERAPY ALERT CARD at check-in to the  Emergency Department and triage nurse. ° °Should you have questions after your visit or need to cancel or reschedule your appointment, please contact [facility]  Dept: [phone]  and follow the prompts.  Office hours are 8:00 a.m. to 4:30 p.m. Monday - Friday. Please note that voicemails left after 4:00 p.m. may not be returned until the following business day.  We are closed weekends and major holidays. You have access to a nurse at all times for urgent questions. Please call the main number to the clinic Dept: [phone] and follow the prompts. ° ° °For any non-urgent questions, you may also contact your provider using MyChart. We now offer e-Visits for anyone 18 and older to request care online for non-urgent symptoms. For details visit [email or web address]. °  °Also download the MyChart app! Go to the app store, search "MyChart", open the app, select [facility], and log in with your MyChart username and password. ° °Due to Covid, a mask is required upon entering the hospital/clinic. If you do not have a mask, one will be given to you upon arrival. For doctor visits, patients may have 1 support person aged 18 or older with them. For treatment visits, patients cannot have anyone with them due to current Covid guidelines and our immunocompromised population.  ° °

## 2021-03-18 NOTE — Progress Notes (Signed)
Patient ok to d/c home per Dr. Grayland Ormond.  He is aware of her increase in BP.  Patients states she is feeling fine.

## 2021-03-19 ENCOUNTER — Encounter: Payer: Self-pay | Admitting: Oncology

## 2021-03-22 ENCOUNTER — Encounter: Payer: Self-pay | Admitting: Licensed Clinical Social Worker

## 2021-03-22 NOTE — Progress Notes (Signed)
[facility]  Clinical Social Work was referred by Sharion Dove NP for assessment of psychosocial needs.  Clinical Social Worker contacted patient by phone  to offer support and assess for needs.  Patient's phone is currently not in service.  This is the only contact number in the patient chart. CSW called to follow-up on breast cancer assistance information CSW emailed patient.   Adelene Amas, [place]  Second attempt

## 2021-03-23 NOTE — Progress Notes (Signed)
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  INTERVAL HISTORY: Patient returns to clinic today for further evaluation and consideration of cycle 2 of 12 of weekly Taxol.  She tolerated her first infusion well without significant side effects.  She currently feels well and is asymptomatic.  She has no neurologic complaints.  She denies any recent fevers or illnesses.  She has a fair appetite, but denies weight loss.  She has no chest pain, shortness of breath, cough, or hemoptysis.  She denies any nausea, vomiting, constipation, or diarrhea.  She has no urinary complaints.  Patient offers no specific complaints today.  REVIEW OF SYSTEMS:   Review of Systems  Constitutional: Negative.  Negative for chills, fever and malaise/fatigue.  Respiratory: Negative.  Negative for cough, hemoptysis and shortness of breath.   Cardiovascular: Negative.  Negative for chest pain and leg swelling.  Gastrointestinal: Negative.  Negative for abdominal pain and nausea.  Genitourinary: Negative.  Negative for dysuria.  Musculoskeletal: Negative.  Negative for back pain.  Skin: Negative.  Negative for rash.  Neurological: Negative.  Negative for dizziness, focal weakness, weakness and headaches.  Psychiatric/Behavioral: Negative.  The patient is not nervous/anxious and does not have insomnia.    As per HPI. Otherwise, a complete review of systems is negative.  PAST MEDICAL HISTORY: Past Medical History:  Diagnosis Date   Anxiety    Headache    Hypertension    Noncompliance     PAST SURGICAL HISTORY: Past Surgical History:  Procedure Laterality Date   BREAST BIOPSY Right  12/24/2020   mass, 9:00 8 cmfn venus marker, path pending   BREAST BIOPSY Left 12/24/2020   mass 3:00 retroareolar, ribbon, path pending   BREAST BIOPSY Left 12/24/2020   mass 3:00 5cmfn, heart marker, path pending   BREAST BIOPSY Left 12/24/2020   axilla LN, hyrdomarker shape 3 coil, path pending   PORTACATH PLACEMENT Right 01/01/2021   Procedure: INSERTION PORT-A-CATH;  Surgeon: Ronny Bacon, MD;  Location: [facility] ORS;  Service: General;  Laterality: Right;   TUBAL LIGATION      FAMILY HISTORY: Family History  Problem Relation Age of Onset   COPD Mother    Hypertension Mother    Heart disease Father     ADVANCED DIRECTIVES (Y/N):  N  HEALTH MAINTENANCE: Social History   Tobacco Use   Smoking status: Every Day    Packs/day: 1.50    Types: Cigarettes   Smokeless tobacco: Never  Vaping Use   Vaping Use: Never used  Substance Use Topics   Alcohol use: Yes    Alcohol/week: 14.0 standard drinks    Types: 14 Cans of beer per week    Comment: at least 2 beers daily   Drug use: No     Colonoscopy:  PAP:  Bone density:  Lipid panel:  Allergies  Allergen Reactions   Codeine Itching and Nausea And Vomiting    Current Outpatient Medications  Medication Sig Dispense Refill   acetaminophen (TYLENOL) 325 MG tablet Take 650 mg by mouth every 6 (six) hours as needed.     ALPRAZolam (XANAX) 0.25 MG tablet Take 1 tablet (0.25 mg total) by mouth 2 (two) times daily as needed for anxiety.  60 tablet 2   bacitracin ointment Apply 1 application topically 2 (two) times daily. 120 g 0   diphenhydrAMINE HCl (BENADRYL ALLERGY PO) Take by mouth.     diphenhydrAMINE-zinc acetate (BENADRYL EXTRA STRENGTH) cream Apply 1 application topically 3 (three) times daily as needed for itching. 28.4 g 0   labetalol (NORMODYNE) 100 MG tablet Take 1 tablet (100 mg total) by mouth 2 (two) times daily. 60 tablet 1   loratadine (CLARITIN) 10 MG tablet Take 10 mg by mouth daily.     magic mouthwash  w/lidocaine SOLN Take 5 mLs by mouth 2 (two) times daily as needed for mouth pain. 5 mL 0   ondansetron (ZOFRAN) 8 MG tablet Take 1 tablet (8 mg total) by mouth 2 (two) times daily. 60 tablet 2   prednisoLONE (ORAPRED) 15 MG/5ML solution Take by mouth.     prochlorperazine (COMPAZINE) 10 MG tablet Take 1 tablet (10 mg total) by mouth every 6 (six) hours as needed for nausea or vomiting. 60 tablet 2   ibuprofen (ADVIL) 800 MG tablet Take 1 tablet (800 mg total) by mouth every 8 (eight) hours as needed. 30 tablet 0   lidocaine-prilocaine (EMLA) cream Apply to affected area once 30 g 3   No current facility-administered medications for this visit.   Facility-Administered Medications Ordered in Other Visits  Medication Dose Route Frequency Provider Last Rate Last Admin   dexamethasone (DECADRON) 10 mg in sodium chloride 0.9 % 50 mL IVPB  10 mg Intravenous Once Lloyd Huger, MD       diphenhydrAMINE (BENADRYL) injection 25 mg  25 mg Intravenous Once Lloyd Huger, MD       famotidine (PEPCID) IVPB 20 mg premix  20 mg Intravenous Once Lloyd Huger, MD       PACLitaxel (TAXOL) 132 mg in sodium chloride 0.9 % 250 mL chemo infusion (</= 63m/m2)  80 mg/m2 (Treatment Plan Recorded) Intravenous Once FLloyd Huger MD        OBJECTIVE: Vitals:   03/25/21 0851  BP: (!) 150/106  Pulse: 92  Resp: 16  Temp: (!) 96.9 F (36.1 C)  SpO2: 99%     Body mass index is 23.4 kg/m.    ECOG FS:0 - Asymptomatic  General: Well-developed, well-nourished, no acute distress. Eyes: Pink conjunctiva, anicteric sclera. HEENT: Normocephalic, moist mucous membranes. Lungs: No audible wheezing or coughing. Heart: Regular rate and rhythm. Abdomen: Soft, nontender, no obvious distention. Musculoskeletal: No edema, cyanosis, or clubbing. Neuro: Alert, answering all questions appropriately. Cranial nerves grossly intact. Skin: No rashes or petechiae noted. Psych: Normal affect.  LAB  RESULTS:  Lab Results  Component Value Date   NA 138 03/18/2021   K 3.6 03/18/2021   CL 101 03/18/2021   CO2 24 03/18/2021   GLUCOSE 119 (H) 03/18/2021   BUN <5 (L) 03/18/2021   CREATININE 0.77 03/18/2021   CALCIUM 9.2 03/18/2021   PROT 7.5 03/18/2021   ALBUMIN 3.9 03/18/2021   AST 15 03/18/2021   ALT 10 03/18/2021   ALKPHOS 138 (H) 03/18/2021   BILITOT 0.4 03/18/2021   GFRNONAA >60 03/18/2021   GFRAA >60 12/01/2017    Lab Results  Component Value Date   WBC 4.6 03/25/2021   NEUTROABS 2.7 03/25/2021   HGB 10.7 (L) 03/25/2021   HCT 33.0 (L) 03/25/2021   MCV 97.1 03/25/2021   PLT 411 (H) 03/25/2021     STUDIES: No results found.  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  PLAN:  1.  Bilateral breast cancer: Case discussed with pathology and this appears to be 2 distinct breast cancer primaries in her right and left breast both of which are ER/PR, HER2 negative malignancies.  CT scan completed on December 30, 2020 reviewed independently and reported as above did not reveal any metastatic disease.  MUGA scan on January 08, 2021 reported an EF of 57.3%.  Patient has also had port placement.  Previously, surgery recommended upfront systemic therapy. Given patient's age, she will also require genetic testing.  Plan to give neoadjuvant Adriamycin plus Cytoxan every 2 weeks with Neulasta support for 4 cycles followed by weekly Taxol for 12 cycles.  Patient also may benefit from adjuvant Xeloda.  Patient completed cycle 4 of Adriamycin and Cytoxan on March 05, 2021.  Proceed with cycle 2 of 12 of weekly Taxol today.  Return to clinic in 1 week for further evaluation and consideration of cycle 3.  2.  Nausea: Resolved.  Continue Compazine as needed. 3.  Anxiety/insomnia: Continue Xanax as needed.   4.  Leukocytosis: Resolved. 5.  Anemia: Chronic and unchanged.  Patient's hemoglobin is 10.7 today. 6.  Thrombocytosis: Likely reactive.  Monitor.   7.  Pain: Patient was  given a prescription for ibuprofen today.  Patient expressed understanding and was in agreement with this plan. She also understands that She can call clinic at any time with any questions, concerns, or complaints.    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[place]) Staging form: Breast, AJCC 8th Edition - Clinical stage from 01/12/2021: Stage IIB (cT2, cN1, cM0, G3, ER+, PR+, HER2-) - Signed by Lloyd Huger, MD on 01/12/2021 Stage prefix: Initial diagnosis Histologic grading system: 3 grade system  Invasive ductal carcinoma of right breast [facility]) Staging form: Breast, AJCC 8th Edition - Clinical stage from 01/12/2021: Stage IB (cT2, cN0, cM0, G2, ER+, PR+, HER2-) - Signed by Lloyd Huger, MD on 01/12/2021 Stage prefix: Initial diagnosis Histologic grading system: 3 grade system  Lloyd Huger, MD   03/25/2021 9:39 AM

## 2021-03-25 ENCOUNTER — Inpatient Hospital Stay (HOSPITAL_BASED_OUTPATIENT_CLINIC_OR_DEPARTMENT_OTHER): Payer: Medicaid Other | Admitting: Oncology

## 2021-03-25 ENCOUNTER — Inpatient Hospital Stay: Payer: Medicaid Other | Attending: Oncology

## 2021-03-25 ENCOUNTER — Other Ambulatory Visit: Payer: Self-pay

## 2021-03-25 ENCOUNTER — Inpatient Hospital Stay: Payer: Medicaid Other

## 2021-03-25 VITALS — BP 150/106 | HR 92 | Temp 96.9°F | Resp 16 | Wt 136.3 lb

## 2021-03-25 DIAGNOSIS — Z79899 Other long term (current) drug therapy: Secondary | ICD-10-CM | POA: Insufficient documentation

## 2021-03-25 DIAGNOSIS — G4700 Insomnia, unspecified: Secondary | ICD-10-CM | POA: Diagnosis not present

## 2021-03-25 DIAGNOSIS — D649 Anemia, unspecified: Secondary | ICD-10-CM | POA: Insufficient documentation

## 2021-03-25 DIAGNOSIS — C50811 Malignant neoplasm of overlapping sites of right female breast: Secondary | ICD-10-CM | POA: Diagnosis present

## 2021-03-25 DIAGNOSIS — C50911 Malignant neoplasm of unspecified site of right female breast: Secondary | ICD-10-CM

## 2021-03-25 DIAGNOSIS — Z5111 Encounter for antineoplastic chemotherapy: Secondary | ICD-10-CM | POA: Insufficient documentation

## 2021-03-25 DIAGNOSIS — C50812 Malignant neoplasm of overlapping sites of left female breast: Secondary | ICD-10-CM | POA: Diagnosis present

## 2021-03-25 DIAGNOSIS — Z170 Estrogen receptor positive status [ER+]: Secondary | ICD-10-CM | POA: Insufficient documentation

## 2021-03-25 DIAGNOSIS — F17210 Nicotine dependence, cigarettes, uncomplicated: Secondary | ICD-10-CM | POA: Insufficient documentation

## 2021-03-25 DIAGNOSIS — E876 Hypokalemia: Secondary | ICD-10-CM | POA: Diagnosis not present

## 2021-03-25 DIAGNOSIS — C50912 Malignant neoplasm of unspecified site of left female breast: Secondary | ICD-10-CM

## 2021-03-25 DIAGNOSIS — Z95828 Presence of other vascular implants and grafts: Secondary | ICD-10-CM

## 2021-03-25 DIAGNOSIS — F419 Anxiety disorder, unspecified: Secondary | ICD-10-CM | POA: Insufficient documentation

## 2021-03-25 LAB — COMPREHENSIVE METABOLIC PANEL
ALT: 10 U/L (ref 0–44)
AST: 16 U/L (ref 15–41)
Albumin: 4 g/dL (ref 3.5–5.0)
Alkaline Phosphatase: 74 U/L (ref 38–126)
Anion gap: 6 (ref 5–15)
BUN: 6 mg/dL (ref 6–20)
CO2: 24 mmol/L (ref 22–32)
Calcium: 9.1 mg/dL (ref 8.9–10.3)
Chloride: 102 mmol/L (ref 98–111)
Creatinine, Ser: 0.64 mg/dL (ref 0.44–1.00)
GFR, Estimated: >60 mL/min (ref >60)
Glucose, Bld: 97 mg/dL (ref 70–99)
Potassium: 3.6 mmol/L (ref 3.5–5.1)
Sodium: 132 mmol/L — ABNORMAL LOW (ref 135–145)
Total Bilirubin: 0.2 mg/dL — ABNORMAL LOW (ref 0.3–1.2)
Total Protein: 7.3 g/dL (ref 6.5–8.1)

## 2021-03-25 LAB — CBC WITH DIFFERENTIAL/PLATELET
Abs Immature Granulocytes: 0.03 10*3/uL (ref 0.00–0.07)
Basophils Absolute: 0 10*3/uL (ref 0.0–0.1)
Basophils Relative: 1 %
Eosinophils Absolute: 0 10*3/uL (ref 0.0–0.5)
Eosinophils Relative: 1 %
HCT: 33 % — ABNORMAL LOW (ref 36.0–46.0)
Hemoglobin: 10.7 g/dL — ABNORMAL LOW (ref 12.0–15.0)
Immature Granulocytes: 1 %
Lymphocytes Relative: 23 %
Lymphs Abs: 1.1 10*3/uL (ref 0.7–4.0)
MCH: 31.5 pg (ref 26.0–34.0)
MCHC: 32.4 g/dL (ref 30.0–36.0)
MCV: 97.1 fL (ref 80.0–100.0)
Monocytes Absolute: 0.8 10*3/uL (ref 0.1–1.0)
Monocytes Relative: 18 %
Neutro Abs: 2.7 10*3/uL (ref 1.7–7.7)
Neutrophils Relative %: 56 %
Platelets: 411 10*3/uL — ABNORMAL HIGH (ref 150–400)
RBC: 3.4 MIL/uL — ABNORMAL LOW (ref 3.87–5.11)
RDW: 20.4 % — ABNORMAL HIGH (ref 11.5–15.5)
WBC: 4.6 10*3/uL (ref 4.0–10.5)
nRBC: 0 % (ref 0.0–0.2)

## 2021-03-25 MED ORDER — DIPHENHYDRAMINE HCL 50 MG/ML IJ SOLN
25.0000 mg | Freq: Once | INTRAMUSCULAR | Status: AC
  Administered 2021-03-25: 25 mg via INTRAVENOUS
  Filled 2021-03-25: qty 1

## 2021-03-25 MED ORDER — SODIUM CHLORIDE 0.9 % IV SOLN
10.0000 mg | Freq: Once | INTRAVENOUS | Status: AC
  Administered 2021-03-25: 10 mg via INTRAVENOUS
  Filled 2021-03-25: qty 10

## 2021-03-25 MED ORDER — HEPARIN SOD (PORK) LOCK FLUSH 100 UNIT/ML IV SOLN
500.0000 [IU] | Freq: Once | INTRAVENOUS | Status: AC
  Administered 2021-03-25: 500 [IU] via INTRAVENOUS
  Filled 2021-03-25: qty 5

## 2021-03-25 MED ORDER — LIDOCAINE-PRILOCAINE 2.5-2.5 % EX CREA: TOPICAL_CREAM | CUTANEOUS | 3 refills | Status: DC

## 2021-03-25 MED ORDER — SODIUM CHLORIDE 0.9 % IV SOLN
Freq: Once | INTRAVENOUS | Status: AC
  Filled 2021-03-25: qty 250

## 2021-03-25 MED ORDER — FAMOTIDINE IN NACL 20-0.9 MG/50ML-% IV SOLN
20.0000 mg | Freq: Once | INTRAVENOUS | Status: AC
  Administered 2021-03-25: 20 mg via INTRAVENOUS
  Filled 2021-03-25: qty 50

## 2021-03-25 MED ORDER — SODIUM CHLORIDE 0.9 % IV SOLN
80.0000 mg/m2 | Freq: Once | INTRAVENOUS | Status: AC
  Administered 2021-03-25: 132 mg via INTRAVENOUS
  Filled 2021-03-25: qty 22

## 2021-03-25 MED ORDER — IBUPROFEN 800 MG PO TABS: 800.0000 mg | ORAL_TABLET | Freq: Three times a day (TID) | ORAL | 0 refills | Status: DC | PRN

## 2021-03-25 NOTE — Progress Notes (Signed)
Pt requesting refill of EMLA cream and ibuprofen. No concerns/complaints at this time.

## 2021-03-25 NOTE — Patient Instructions (Signed)
[facility] CANCER CTR AT [place]  Discharge Instructions: Thank you for choosing [facility] to provide your oncology and hematology care.  If you have a lab appointment with the [facility], please go directly to the [facility] and check in at the registration area.  Wear comfortable clothing and clothing appropriate for easy access to any Portacath or PICC line.   We strive to give you quality time with your provider. You may need to reschedule your appointment if you arrive late (15 or more minutes).  Arriving late affects you and other patients whose appointments are after yours.  Also, if you miss three or more appointments without notifying the office, you may be dismissed from the clinic at the providers discretion.      For prescription refill requests, have your pharmacy contact our office and allow 72 hours for refills to be completed.    Today you received the following chemotherapy and/or immunotherapy agents TAXOL      To help prevent nausea and vomiting after your treatment, we encourage you to take your nausea medication as directed.  BELOW ARE SYMPTOMS THAT SHOULD BE REPORTED IMMEDIATELY: *FEVER GREATER THAN 100.4 F (38 C) OR HIGHER *CHILLS OR SWEATING *NAUSEA AND VOMITING THAT IS NOT CONTROLLED WITH YOUR NAUSEA MEDICATION *UNUSUAL SHORTNESS OF BREATH *UNUSUAL BRUISING OR BLEEDING *URINARY PROBLEMS (pain or burning when urinating, or frequent urination) *BOWEL PROBLEMS (unusual diarrhea, constipation, pain near the anus) TENDERNESS IN MOUTH AND THROAT WITH OR WITHOUT PRESENCE OF ULCERS (sore throat, sores in mouth, or a toothache) UNUSUAL RASH, SWELLING OR PAIN  UNUSUAL VAGINAL DISCHARGE OR ITCHING   Items with * indicate a potential emergency and should be followed up as soon as possible or go to the Emergency Department if any problems should occur.  Please show the CHEMOTHERAPY ALERT CARD or IMMUNOTHERAPY ALERT CARD at check-in to the  Emergency Department and triage nurse.  Should you have questions after your visit or need to cancel or reschedule your appointment, please contact [facility] CANCER [place] AT [place]  [phone] and follow the prompts.  Office hours are 8:00 a.m. to 4:30 p.m. Monday - Friday. Please note that voicemails left after 4:00 p.m. may not be returned until the following business day.  We are closed weekends and major holidays. You have access to a nurse at all times for urgent questions. Please call the main number to the clinic [phone] and follow the prompts.  For any non-urgent questions, you may also contact your provider using MyChart. We now offer e-Visits for anyone 77 and older to request care online for non-urgent symptoms. For details visit [email or web address].   Also download the MyChart app! Go to the app store, search "MyChart", open the app, select [facility], and log in with your MyChart username and password.  Due to Covid, a mask is required upon entering the hospital/clinic. If you do not have a mask, one will be given to you upon arrival. For doctor visits, patients may have 1 support person aged 55 or older with them. For treatment visits, patients cannot have anyone with them due to current Covid guidelines and our immunocompromised population.   Paclitaxel injection What is this medication? PACLITAXEL (PAK li TAX el) is a chemotherapy drug. It targets fast dividing cells, like cancer cells, and causes these cells to die. This medicine is used to treat ovarian cancer, breast cancer, lung cancer, Kaposi's sarcoma, and other cancers. This medicine may be used for other purposes; ask  your health care provider or pharmacist if you have questions. COMMON BRAND NAME(S): Onxol, Taxol What should I tell my care team before I take this medication? They need to know if you have any of these conditions: history of irregular heartbeat liver disease low blood counts, like low  white cell, platelet, or red cell counts lung or breathing disease, like asthma tingling of the fingers or toes, or other nerve disorder an unusual or allergic reaction to paclitaxel, alcohol, polyoxyethylated castor oil, other chemotherapy, other medicines, foods, dyes, or preservatives pregnant or trying to get pregnant breast-feeding How should I use this medication? This drug is given as an infusion into a vein. It is administered in a hospital or clinic by a specially trained health care professional. Talk to your pediatrician regarding the use of this medicine in children. Special care may be needed. Overdosage: If you think you have taken too much of this medicine contact a poison control center or emergency room at once. NOTE: This medicine is only for you. Do not share this medicine with others. What if I miss a dose? It is important not to miss your dose. Call your doctor or health care professional if you are unable to keep an appointment. What may interact with this medication? Do not take this medicine with any of the following medications: live virus vaccines This medicine may also interact with the following medications: antiviral medicines for hepatitis, HIV or AIDS certain antibiotics like erythromycin and clarithromycin certain medicines for fungal infections like ketoconazole and itraconazole certain medicines for seizures like carbamazepine, phenobarbital, phenytoin gemfibrozil nefazodone rifampin St. John's wort This list may not describe all possible interactions. Give your health care provider a list of all the medicines, herbs, non-prescription drugs, or dietary supplements you use. Also tell them if you smoke, drink alcohol, or use illegal drugs. Some items may interact with your medicine. What should I watch for while using this medication? Your condition will be monitored carefully while you are receiving this medicine. You will need important blood work done  while you are taking this medicine. This medicine can cause serious allergic reactions. To reduce your risk you will need to take other medicine(s) before treatment with this medicine. If you experience allergic reactions like skin rash, itching or hives, swelling of the face, lips, or tongue, tell your doctor or health care professional right away. In some cases, you may be given additional medicines to help with side effects. Follow all directions for their use. This drug may make you feel generally unwell. This is not uncommon, as chemotherapy can affect healthy cells as well as cancer cells. Report any side effects. Continue your course of treatment even though you feel ill unless your doctor tells you to stop. Call your doctor or health care professional for advice if you get a fever, chills or sore throat, or other symptoms of a cold or flu. Do not treat yourself. This drug decreases your body's ability to fight infections. Try to avoid being around people who are sick. This medicine may increase your risk to bruise or bleed. Call your doctor or health care professional if you notice any unusual bleeding. Be careful brushing and flossing your teeth or using a toothpick because you may get an infection or bleed more easily. If you have any dental work done, tell your dentist you are receiving this medicine. Avoid taking products that contain aspirin, acetaminophen, ibuprofen, naproxen, or ketoprofen unless instructed by your doctor. These medicines may hide a  fever. Do not become pregnant while taking this medicine. Women should inform their doctor if they wish to become pregnant or think they might be pregnant. There is a potential for serious side effects to an unborn child. Talk to your health care professional or pharmacist for more information. Do not breast-feed an infant while taking this medicine. Men are advised not to father a child while receiving this medicine. This product may contain  alcohol. Ask your pharmacist or healthcare provider if this medicine contains alcohol. Be sure to tell all healthcare providers you are taking this medicine. Certain medicines, like metronidazole and disulfiram, can cause an unpleasant reaction when taken with alcohol. The reaction includes flushing, headache, nausea, vomiting, sweating, and increased thirst. The reaction can last from 30 minutes to several hours. What side effects may I notice from receiving this medication? Side effects that you should report to your doctor or health care professional as soon as possible: allergic reactions like skin rash, itching or hives, swelling of the face, lips, or tongue breathing problems changes in vision fast, irregular heartbeat high or low blood pressure mouth sores pain, tingling, numbness in the hands or feet signs of decreased platelets or bleeding - bruising, pinpoint red spots on the skin, black, tarry stools, blood in the urine signs of decreased red blood cells - unusually weak or tired, feeling faint or lightheaded, falls signs of infection - fever or chills, cough, sore throat, pain or difficulty passing urine signs and symptoms of liver injury like dark yellow or brown urine; general ill feeling or flu-like symptoms; light-colored stools; loss of appetite; nausea; right upper belly pain; unusually weak or tired; yellowing of the eyes or skin swelling of the ankles, feet, hands unusually slow heartbeat Side effects that usually do not require medical attention (report to your doctor or health care professional if they continue or are bothersome): diarrhea hair loss loss of appetite muscle or joint pain nausea, vomiting pain, redness, or irritation at site where injected tiredness This list may not describe all possible side effects. Call your doctor for medical advice about side effects. You may report side effects to FDA at 1-800-FDA-1088. Where should I keep my medication? This drug  is given in a hospital or clinic and will not be stored at home. NOTE: This sheet is a summary. It may not cover all possible information. If you have questions about this medicine, talk to your doctor, pharmacist, or health care provider.  2022 Elsevier/Gold Standard (2020-10-27 00:00:00)

## 2021-03-30 ENCOUNTER — Encounter: Payer: Self-pay | Admitting: Licensed Clinical Social Worker

## 2021-03-30 NOTE — Progress Notes (Signed)
[facility]  Clinical Social Work was referred by Sandre Kitty NP for assessment of psychosocial needs.  Clinical Social Worker contacted patient by phone  to offer support and assess for needs.  CSW was unable to leave voicemail because patient's voicemail is not set-up.   Adelene Amas, [place]       Third Attempt

## 2021-04-01 ENCOUNTER — Inpatient Hospital Stay: Payer: Medicaid Other

## 2021-04-01 ENCOUNTER — Inpatient Hospital Stay (HOSPITAL_BASED_OUTPATIENT_CLINIC_OR_DEPARTMENT_OTHER): Payer: Medicaid Other | Admitting: Oncology

## 2021-04-01 ENCOUNTER — Encounter: Payer: Self-pay | Admitting: Oncology

## 2021-04-01 ENCOUNTER — Other Ambulatory Visit: Payer: Self-pay

## 2021-04-01 VITALS — BP 155/105 | HR 92 | Temp 97.3°F | Resp 16 | Wt 139.3 lb

## 2021-04-01 DIAGNOSIS — C50911 Malignant neoplasm of unspecified site of right female breast: Secondary | ICD-10-CM

## 2021-04-01 DIAGNOSIS — Z5111 Encounter for antineoplastic chemotherapy: Secondary | ICD-10-CM | POA: Diagnosis not present

## 2021-04-01 DIAGNOSIS — C50912 Malignant neoplasm of unspecified site of left female breast: Secondary | ICD-10-CM

## 2021-04-01 LAB — CBC WITH DIFFERENTIAL/PLATELET
Abs Immature Granulocytes: 0.02 10*3/uL (ref 0.00–0.07)
Basophils Absolute: 0 10*3/uL (ref 0.0–0.1)
Basophils Relative: 1 %
Eosinophils Absolute: 0 10*3/uL (ref 0.0–0.5)
Eosinophils Relative: 1 %
HCT: 31.9 % — ABNORMAL LOW (ref 36.0–46.0)
Hemoglobin: 10.4 g/dL — ABNORMAL LOW (ref 12.0–15.0)
Immature Granulocytes: 1 %
Lymphocytes Relative: 28 %
Lymphs Abs: 0.9 10*3/uL (ref 0.7–4.0)
MCH: 32.4 pg (ref 26.0–34.0)
MCHC: 32.6 g/dL (ref 30.0–36.0)
MCV: 99.4 fL (ref 80.0–100.0)
Monocytes Absolute: 0.5 10*3/uL (ref 0.1–1.0)
Monocytes Relative: 16 %
Neutro Abs: 1.7 10*3/uL (ref 1.7–7.7)
Neutrophils Relative %: 53 %
Platelets: 259 10*3/uL (ref 150–400)
RBC: 3.21 MIL/uL — ABNORMAL LOW (ref 3.87–5.11)
RDW: 20.3 % — ABNORMAL HIGH (ref 11.5–15.5)
WBC: 3.3 10*3/uL — ABNORMAL LOW (ref 4.0–10.5)
nRBC: 0 % (ref 0.0–0.2)

## 2021-04-01 LAB — COMPREHENSIVE METABOLIC PANEL
ALT: 11 U/L (ref 0–44)
AST: 20 U/L (ref 15–41)
Albumin: 3.8 g/dL (ref 3.5–5.0)
Alkaline Phosphatase: 66 U/L (ref 38–126)
Anion gap: 11 (ref 5–15)
BUN: 7 mg/dL (ref 6–20)
CO2: 23 mmol/L (ref 22–32)
Calcium: 9.3 mg/dL (ref 8.9–10.3)
Chloride: 102 mmol/L (ref 98–111)
Creatinine, Ser: 0.76 mg/dL (ref 0.44–1.00)
GFR, Estimated: >60 mL/min (ref >60)
Glucose, Bld: 120 mg/dL — ABNORMAL HIGH (ref 70–99)
Potassium: 3.2 mmol/L — ABNORMAL LOW (ref 3.5–5.1)
Sodium: 136 mmol/L (ref 135–145)
Total Bilirubin: 0.1 mg/dL — ABNORMAL LOW (ref 0.3–1.2)
Total Protein: 7.3 g/dL (ref 6.5–8.1)

## 2021-04-01 MED ORDER — SODIUM CHLORIDE 0.9 % IV SOLN
10.0000 mg | Freq: Once | INTRAVENOUS | Status: AC
  Administered 2021-04-01: 10 mg via INTRAVENOUS
  Filled 2021-04-01: qty 10

## 2021-04-01 MED ORDER — SODIUM CHLORIDE 0.9 % IV SOLN
80.0000 mg/m2 | Freq: Once | INTRAVENOUS | Status: AC
  Administered 2021-04-01: 132 mg via INTRAVENOUS
  Filled 2021-04-01: qty 22

## 2021-04-01 MED ORDER — SODIUM CHLORIDE 0.9 % IV SOLN
Freq: Once | INTRAVENOUS | Status: AC
  Filled 2021-04-01: qty 250

## 2021-04-01 MED ORDER — SODIUM CHLORIDE 0.9% FLUSH
10.0000 mL | INTRAVENOUS | Status: DC | PRN
  Filled 2021-04-01: qty 10

## 2021-04-01 MED ORDER — DIPHENHYDRAMINE HCL 50 MG/ML IJ SOLN
25.0000 mg | Freq: Once | INTRAMUSCULAR | Status: AC
  Administered 2021-04-01: 25 mg via INTRAVENOUS
  Filled 2021-04-01: qty 1

## 2021-04-01 MED ORDER — HEPARIN SOD (PORK) LOCK FLUSH 100 UNIT/ML IV SOLN
INTRAVENOUS | Status: AC
  Administered 2021-04-01: 500 [IU]
  Filled 2021-04-01: qty 5

## 2021-04-01 MED ORDER — FAMOTIDINE IN NACL 20-0.9 MG/50ML-% IV SOLN
20.0000 mg | Freq: Once | INTRAVENOUS | Status: AC
  Administered 2021-04-01: 20 mg via INTRAVENOUS
  Filled 2021-04-01: qty 50

## 2021-04-01 MED ORDER — HEPARIN SOD (PORK) LOCK FLUSH 100 UNIT/ML IV SOLN
500.0000 [IU] | Freq: Once | INTRAVENOUS | Status: AC | PRN
  Filled 2021-04-01: qty 5

## 2021-04-01 NOTE — Patient Instructions (Signed)
[facility] CANCER CTR AT [place]  Discharge Instructions: Thank you for choosing [facility] to provide your oncology and hematology care.  If you have a lab appointment with the [facility], please go directly to the [place] and check in at the registration area.  Wear comfortable clothing and clothing appropriate for easy access to any Portacath or PICC line.   We strive to give you quality time with your provider. You may need to reschedule your appointment if you arrive late (15 or more minutes).  Arriving late affects you and other patients whose appointments are after yours.  Also, if you miss three or more appointments without notifying the office, you may be dismissed from the clinic at the providers discretion.      For prescription refill requests, have your pharmacy contact our office and allow 72 hours for refills to be completed.    Today you received the following chemotherapy and/or immunotherapy agents - paclitaxel      To help prevent nausea and vomiting after your treatment, we encourage you to take your nausea medication as directed.  BELOW ARE SYMPTOMS THAT SHOULD BE REPORTED IMMEDIATELY: *FEVER GREATER THAN 100.4 F (38 C) OR HIGHER *CHILLS OR SWEATING *NAUSEA AND VOMITING THAT IS NOT CONTROLLED WITH YOUR NAUSEA MEDICATION *UNUSUAL SHORTNESS OF BREATH *UNUSUAL BRUISING OR BLEEDING *URINARY PROBLEMS (pain or burning when urinating, or frequent urination) *BOWEL PROBLEMS (unusual diarrhea, constipation, pain near the anus) TENDERNESS IN MOUTH AND THROAT WITH OR WITHOUT PRESENCE OF ULCERS (sore throat, sores in mouth, or a toothache) UNUSUAL RASH, SWELLING OR PAIN  UNUSUAL VAGINAL DISCHARGE OR ITCHING   Items with * indicate a potential emergency and should be followed up as soon as possible or go to the Emergency Department if any problems should occur.  Please show the CHEMOTHERAPY ALERT CARD or IMMUNOTHERAPY ALERT CARD at check-in  to the Emergency Department and triage nurse.  Should you have questions after your visit or need to cancel or reschedule your appointment, please contact [facility] CANCER [place] AT [place]  [phone] and follow the prompts.  Office hours are 8:00 a.m. to 4:30 p.m. Monday - Friday. Please note that voicemails left after 4:00 p.m. may not be returned until the following business day.  We are closed weekends and major holidays. You have access to a nurse at all times for urgent questions. Please call the main number to the clinic [phone] and follow the prompts.  For any non-urgent questions, you may also contact your provider using MyChart. We now offer e-Visits for anyone 37 and older to request care online for non-urgent symptoms. For details visit [email or web address].   Also download the MyChart app! Go to the app store, search "MyChart", open the app, select [facility], and log in with your MyChart username and password.  Due to Covid, a mask is required upon entering the hospital/clinic. If you do not have a mask, one will be given to you upon arrival. For doctor visits, patients may have 1 support person aged 70 or older with them. For treatment visits, patients cannot have anyone with them due to current Covid guidelines and our immunocompromised population.   Paclitaxel injection What is this medication? PACLITAXEL (PAK li TAX el) is a chemotherapy drug. It targets fast dividing cells, like cancer cells, and causes these cells to die. This medicine is used to treat ovarian cancer, breast cancer, lung cancer, Kaposi's sarcoma, and other cancers. This medicine may be used for other purposes;  ask your health care provider or pharmacist if you have questions. COMMON BRAND NAME(S): Onxol, Taxol What should I tell my care team before I take this medication? They need to know if you have any of these conditions: history of irregular heartbeat liver disease low blood counts,  like low white cell, platelet, or red cell counts lung or breathing disease, like asthma tingling of the fingers or toes, or other nerve disorder an unusual or allergic reaction to paclitaxel, alcohol, polyoxyethylated castor oil, other chemotherapy, other medicines, foods, dyes, or preservatives pregnant or trying to get pregnant breast-feeding How should I use this medication? This drug is given as an infusion into a vein. It is administered in a hospital or clinic by a specially trained health care professional. Talk to your pediatrician regarding the use of this medicine in children. Special care may be needed. Overdosage: If you think you have taken too much of this medicine contact a poison control center or emergency room at once. NOTE: This medicine is only for you. Do not share this medicine with others. What if I miss a dose? It is important not to miss your dose. Call your doctor or health care professional if you are unable to keep an appointment. What may interact with this medication? Do not take this medicine with any of the following medications: live virus vaccines This medicine may also interact with the following medications: antiviral medicines for hepatitis, HIV or AIDS certain antibiotics like erythromycin and clarithromycin certain medicines for fungal infections like ketoconazole and itraconazole certain medicines for seizures like carbamazepine, phenobarbital, phenytoin gemfibrozil nefazodone rifampin St. John's wort This list may not describe all possible interactions. Give your health care provider a list of all the medicines, herbs, non-prescription drugs, or dietary supplements you use. Also tell them if you smoke, drink alcohol, or use illegal drugs. Some items may interact with your medicine. What should I watch for while using this medication? Your condition will be monitored carefully while you are receiving this medicine. You will need important blood work  done while you are taking this medicine. This medicine can cause serious allergic reactions. To reduce your risk you will need to take other medicine(s) before treatment with this medicine. If you experience allergic reactions like skin rash, itching or hives, swelling of the face, lips, or tongue, tell your doctor or health care professional right away. In some cases, you may be given additional medicines to help with side effects. Follow all directions for their use. This drug may make you feel generally unwell. This is not uncommon, as chemotherapy can affect healthy cells as well as cancer cells. Report any side effects. Continue your course of treatment even though you feel ill unless your doctor tells you to stop. Call your doctor or health care professional for advice if you get a fever, chills or sore throat, or other symptoms of a cold or flu. Do not treat yourself. This drug decreases your body's ability to fight infections. Try to avoid being around people who are sick. This medicine may increase your risk to bruise or bleed. Call your doctor or health care professional if you notice any unusual bleeding. Be careful brushing and flossing your teeth or using a toothpick because you may get an infection or bleed more easily. If you have any dental work done, tell your dentist you are receiving this medicine. Avoid taking products that contain aspirin, acetaminophen, ibuprofen, naproxen, or ketoprofen unless instructed by your doctor. These medicines may hide  a fever. Do not become pregnant while taking this medicine. Women should inform their doctor if they wish to become pregnant or think they might be pregnant. There is a potential for serious side effects to an unborn child. Talk to your health care professional or pharmacist for more information. Do not breast-feed an infant while taking this medicine. Men are advised not to father a child while receiving this medicine. This product may  contain alcohol. Ask your pharmacist or healthcare provider if this medicine contains alcohol. Be sure to tell all healthcare providers you are taking this medicine. Certain medicines, like metronidazole and disulfiram, can cause an unpleasant reaction when taken with alcohol. The reaction includes flushing, headache, nausea, vomiting, sweating, and increased thirst. The reaction can last from 30 minutes to several hours. What side effects may I notice from receiving this medication? Side effects that you should report to your doctor or health care professional as soon as possible: allergic reactions like skin rash, itching or hives, swelling of the face, lips, or tongue breathing problems changes in vision fast, irregular heartbeat high or low blood pressure mouth sores pain, tingling, numbness in the hands or feet signs of decreased platelets or bleeding - bruising, pinpoint red spots on the skin, black, tarry stools, blood in the urine signs of decreased red blood cells - unusually weak or tired, feeling faint or lightheaded, falls signs of infection - fever or chills, cough, sore throat, pain or difficulty passing urine signs and symptoms of liver injury like dark yellow or brown urine; general ill feeling or flu-like symptoms; light-colored stools; loss of appetite; nausea; right upper belly pain; unusually weak or tired; yellowing of the eyes or skin swelling of the ankles, feet, hands unusually slow heartbeat Side effects that usually do not require medical attention (report to your doctor or health care professional if they continue or are bothersome): diarrhea hair loss loss of appetite muscle or joint pain nausea, vomiting pain, redness, or irritation at site where injected tiredness This list may not describe all possible side effects. Call your doctor for medical advice about side effects. You may report side effects to FDA at 1-800-FDA-1088. Where should I keep my  medication? This drug is given in a hospital or clinic and will not be stored at home. NOTE: This sheet is a summary. It may not cover all possible information. If you have questions about this medicine, talk to your doctor, pharmacist, or health care provider.  2022 Elsevier/Gold Standard (2020-10-27 00:00:00)

## 2021-04-01 NOTE — Progress Notes (Signed)
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  INTERVAL HISTORY: Patient returns to clinic today for further evaluation and consideration of cycle 3 of 12 of weekly Taxol.  She continues to tolerate her treatments well without significant side effects.  Currently feels well and is asymptomatic.  She has no neurologic complaints.  She denies any recent fevers or illnesses.  She has a fair appetite, but denies weight loss.  She has no chest pain, shortness of breath, cough, or hemoptysis.  She denies any nausea, vomiting, constipation, or diarrhea.  She has no urinary complaints.  Patient offers no specific complaints today.  REVIEW OF SYSTEMS:   Review of Systems  Constitutional: Negative.  Negative for chills, fever and malaise/fatigue.  Respiratory: Negative.  Negative for cough, hemoptysis and shortness of breath.   Cardiovascular: Negative.  Negative for chest pain and leg swelling.  Gastrointestinal: Negative.  Negative for abdominal pain and nausea.  Genitourinary: Negative.  Negative for dysuria.  Musculoskeletal: Negative.  Negative for back pain.  Skin: Negative.  Negative for rash.  Neurological: Negative.  Negative for dizziness, focal weakness, weakness and headaches.  Psychiatric/Behavioral: Negative.  The patient is not nervous/anxious and does not have insomnia.    As per HPI. Otherwise, a complete review of systems is negative.  PAST MEDICAL HISTORY: Past Medical History:  Diagnosis Date   Anxiety    Headache    Hypertension    Noncompliance     PAST SURGICAL HISTORY: Past Surgical History:  Procedure Laterality Date   BREAST BIOPSY Right  12/24/2020   mass, 9:00 8 cmfn venus marker, path pending   BREAST BIOPSY Left 12/24/2020   mass 3:00 retroareolar, ribbon, path pending   BREAST BIOPSY Left 12/24/2020   mass 3:00 5cmfn, heart marker, path pending   BREAST BIOPSY Left 12/24/2020   axilla LN, hyrdomarker shape 3 coil, path pending   PORTACATH PLACEMENT Right 01/01/2021   Procedure: INSERTION PORT-A-CATH;  Surgeon: Ronny Bacon, MD;  Location: [facility] ORS;  Service: General;  Laterality: Right;   TUBAL LIGATION      FAMILY HISTORY: Family History  Problem Relation Age of Onset   COPD Mother    Hypertension Mother    Heart disease Father     ADVANCED DIRECTIVES (Y/N):  N  HEALTH MAINTENANCE: Social History   Tobacco Use   Smoking status: Every Day    Packs/day: 1.50    Types: Cigarettes   Smokeless tobacco: Never  Vaping Use   Vaping Use: Never used  Substance Use Topics   Alcohol use: Yes    Alcohol/week: 14.0 standard drinks    Types: 14 Cans of beer per week    Comment: at least 2 beers daily   Drug use: No     Colonoscopy:  PAP:  Bone density:  Lipid panel:  Allergies  Allergen Reactions   Codeine Itching and Nausea And Vomiting    Current Outpatient Medications  Medication Sig Dispense Refill   acetaminophen (TYLENOL) 325 MG tablet Take 650 mg by mouth every 6 (six) hours as needed.     ALPRAZolam (XANAX) 0.25 MG tablet Take 1 tablet (0.25 mg total) by mouth 2 (two) times daily as needed for anxiety.  60 tablet 2   bacitracin ointment Apply 1 application topically 2 (two) times daily. 120 g 0   diphenhydrAMINE HCl (BENADRYL ALLERGY PO) Take by mouth.     diphenhydrAMINE-zinc acetate (BENADRYL EXTRA STRENGTH) cream Apply 1 application topically 3 (three) times daily as needed for itching. 28.4 g 0   ibuprofen (ADVIL) 800 MG tablet Take 1 tablet (800 mg total) by mouth every 8 (eight) hours as needed. 30 tablet 0   labetalol (NORMODYNE) 100 MG tablet Take 1 tablet (100 mg total) by mouth 2  (two) times daily. 60 tablet 1   lidocaine-prilocaine (EMLA) cream Apply to affected area once 30 g 3   loratadine (CLARITIN) 10 MG tablet Take 10 mg by mouth daily.     magic mouthwash w/lidocaine SOLN Take 5 mLs by mouth 2 (two) times daily as needed for mouth pain. 5 mL 0   ondansetron (ZOFRAN) 8 MG tablet Take 1 tablet (8 mg total) by mouth 2 (two) times daily. 60 tablet 2   prednisoLONE (ORAPRED) 15 MG/5ML solution Take by mouth.     prochlorperazine (COMPAZINE) 10 MG tablet Take 1 tablet (10 mg total) by mouth every 6 (six) hours as needed for nausea or vomiting. 60 tablet 2   No current facility-administered medications for this visit.   Facility-Administered Medications Ordered in Other Visits  Medication Dose Route Frequency Provider Last Rate Last Admin   sodium chloride flush (NS) 0.9 % injection 10 mL  10 mL Intracatheter PRN Lloyd Huger, MD        OBJECTIVE: Vitals:   04/01/21 0859  BP: (!) 155/105  Pulse: 92  Resp: 16  Temp: (!) 97.3 F (36.3 C)     Body mass index is 23.91 kg/m.    ECOG FS:0 - Asymptomatic  General: Well-developed, well-nourished, no acute distress. Eyes: Pink conjunctiva, anicteric sclera. HEENT: Normocephalic, moist mucous membranes. Lungs: No audible wheezing or coughing. Heart: Regular rate and rhythm. Abdomen: Soft, nontender, no obvious distention. Musculoskeletal: No edema, cyanosis, or clubbing. Neuro: Alert, answering all questions appropriately. Cranial nerves grossly intact. Skin: No rashes or petechiae noted. Psych: Normal affect.  LAB RESULTS:  Lab Results  Component Value Date   NA 136 04/01/2021   K 3.2 (L) 04/01/2021   CL 102 04/01/2021   CO2 23 04/01/2021   GLUCOSE 120 (H) 04/01/2021   BUN 7 04/01/2021   CREATININE 0.76 04/01/2021   CALCIUM 9.3 04/01/2021   PROT 7.3 04/01/2021   ALBUMIN 3.8 04/01/2021   AST 20 04/01/2021   ALT 11 04/01/2021   ALKPHOS 66 04/01/2021   BILITOT 0.1 (L) 04/01/2021   GFRNONAA  >60 04/01/2021   GFRAA >60 12/01/2017    Lab Results  Component Value Date   WBC 3.3 (L) 04/01/2021   NEUTROABS 1.7 04/01/2021   HGB 10.4 (L) 04/01/2021   HCT 31.9 (L) 04/01/2021   MCV 99.4 04/01/2021   PLT 259 04/01/2021     STUDIES: No results found.  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  PLAN:    1.  Bilateral breast cancer: Case discussed with pathology and this appears to be 2 distinct breast cancer primaries in her right and left breast both of which are ER/PR, HER2 negative malignancies.  CT scan completed on December 30, 2020 reviewed independently and reported as above did not reveal any metastatic disease.  MUGA scan on January 08, 2021 reported an EF of 57.3%.  Patient has also had port placement.  Previously, surgery recommended upfront systemic  therapy. Given patient's age, she will also require genetic testing.  Plan to give neoadjuvant Adriamycin plus Cytoxan every 2 weeks with Neulasta support for 4 cycles followed by weekly Taxol for 12 cycles.  Patient also may benefit from adjuvant Xeloda.  Patient completed cycle 4 of Adriamycin and Cytoxan on March 05, 2021.  Proceed with cycle 3 of 12 of weekly Taxol today.  Return to clinic in 1 week for further evaluation and consideration of cycle 4.   2.  Nausea: Resolved.  Continue Compazine as needed. 3.  Anxiety/insomnia: Continue Xanax as needed.   4.  Leukopenia: Mild, monitor. 5.  Anemia: Chronic and unchanged.  Patient's hemoglobin is 10.4 today. 6.  Thrombocytosis: Resolved. 7.  Pain: Continue ibuprofen sparingly as needed. 8.  Hypokalemia: Patient was given dietary recommendations.   Patient expressed understanding and was in agreement with this plan. She also understands that She can call clinic at any time with any questions, concerns, or complaints.    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HCC) Staging form: Breast, AJCC 8th Edition - Clinical stage from 01/12/2021: Stage  IIB (cT2, cN1, cM0, G3, ER+, PR+, HER2-) - Signed by Lloyd Huger, MD on 01/12/2021 Stage prefix: Initial diagnosis Histologic grading system: 3 grade system  Invasive ductal carcinoma of right breast [facility]) Staging form: Breast, AJCC 8th Edition - Clinical stage from 01/12/2021: Stage IB (cT2, cN0, cM0, G2, ER+, PR+, HER2-) - Signed by Lloyd Huger, MD on 01/12/2021 Stage prefix: Initial diagnosis Histologic grading system: 3 grade system  Lloyd Huger, MD   04/01/2021 1:34 PM

## 2021-04-01 NOTE — Progress Notes (Signed)
Patient here for pre treatment check no complaints or questions today her BP is elevated which she reports is normal for her she is on medication and monitors BP at home.

## 2021-04-05 NOTE — Progress Notes (Signed)
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  INTERVAL HISTORY: Patient returns to clinic today for further evaluation and consideration of cycle 4 of 12 of weekly Taxol.  She currently feels well and is asymptomatic.  She continues to tolerate her treatments without any side effects.  She has no neurologic complaints.  She denies any recent fevers or illnesses.  She has a fair appetite, but denies weight loss.  She has no chest pain, shortness of breath, cough, or hemoptysis.  She denies any nausea, vomiting, constipation, or diarrhea.  She has no urinary complaints.  Patient offers no specific complaints today.  REVIEW OF SYSTEMS:   Review of Systems  Constitutional: Negative.  Negative for chills, fever and malaise/fatigue.  Respiratory: Negative.  Negative for cough, hemoptysis and shortness of breath.   Cardiovascular: Negative.  Negative for chest pain and leg swelling.  Gastrointestinal: Negative.  Negative for abdominal pain and nausea.  Genitourinary: Negative.  Negative for dysuria.  Musculoskeletal: Negative.  Negative for back pain.  Skin: Negative.  Negative for rash.  Neurological: Negative.  Negative for dizziness, focal weakness, weakness and headaches.  Psychiatric/Behavioral: Negative.  The patient is not nervous/anxious and does not have insomnia.    As per HPI. Otherwise, a complete review of systems is negative.  PAST MEDICAL HISTORY: Past Medical History:  Diagnosis Date   Anxiety    Headache    Hypertension    Noncompliance     PAST SURGICAL HISTORY: Past Surgical History:  Procedure Laterality Date   BREAST BIOPSY Right  12/24/2020   mass, 9:00 8 cmfn venus marker, path pending   BREAST BIOPSY Left 12/24/2020   mass 3:00 retroareolar, ribbon, path pending   BREAST BIOPSY Left 12/24/2020   mass 3:00 5cmfn, heart marker, path pending   BREAST BIOPSY Left 12/24/2020   axilla LN, hyrdomarker shape 3 coil, path pending   PORTACATH PLACEMENT Right 01/01/2021   Procedure: INSERTION PORT-A-CATH;  Surgeon: Ronny Bacon, MD;  Location: [facility] ORS;  Service: General;  Laterality: Right;   TUBAL LIGATION      FAMILY HISTORY: Family History  Problem Relation Age of Onset   COPD Mother    Hypertension Mother    Heart disease Father     ADVANCED DIRECTIVES (Y/N):  N  HEALTH MAINTENANCE: Social History   Tobacco Use   Smoking status: Every Day    Packs/day: 1.50    Types: Cigarettes   Smokeless tobacco: Never  Vaping Use   Vaping Use: Never used  Substance Use Topics   Alcohol use: Yes    Alcohol/week: 14.0 standard drinks    Types: 14 Cans of beer per week    Comment: at least 2 beers daily   Drug use: No     Colonoscopy:  PAP:  Bone density:  Lipid panel:  Allergies  Allergen Reactions   Codeine Itching and Nausea And Vomiting    Current Outpatient Medications  Medication Sig Dispense Refill   acetaminophen (TYLENOL) 325 MG tablet Take 650 mg by mouth every 6 (six) hours as needed.     ALPRAZolam (XANAX) 0.25 MG tablet Take 1 tablet (0.25 mg total) by mouth 2 (two) times daily as needed for  anxiety. 60 tablet 2   bacitracin ointment Apply 1 application topically 2 (two) times daily. 120 g 0   diphenhydrAMINE HCl (BENADRYL ALLERGY PO) Take by mouth.     diphenhydrAMINE-zinc acetate (BENADRYL EXTRA STRENGTH) cream Apply 1 application topically 3 (three) times daily as needed for itching. 28.4 g 0   ibuprofen (ADVIL) 800 MG tablet Take 1 tablet (800 mg total) by mouth every 8 (eight) hours as needed. 30 tablet 0   labetalol (NORMODYNE) 100 MG tablet Take 1 tablet (100 mg total) by mouth 2  (two) times daily. 60 tablet 1   lidocaine-prilocaine (EMLA) cream Apply to affected area once 30 g 3   loratadine (CLARITIN) 10 MG tablet Take 10 mg by mouth daily.     ondansetron (ZOFRAN) 8 MG tablet Take 1 tablet (8 mg total) by mouth 2 (two) times daily. 60 tablet 2   prednisoLONE (ORAPRED) 15 MG/5ML solution Take by mouth.     prochlorperazine (COMPAZINE) 10 MG tablet Take 1 tablet (10 mg total) by mouth every 6 (six) hours as needed for nausea or vomiting. 60 tablet 2   No current facility-administered medications for this visit.    OBJECTIVE: Vitals:   04/08/21 0846  BP: (!) 147/112  Pulse: 93  Resp: 16  Temp: (!) 97.2 F (36.2 C)  SpO2: 99%     Body mass index is 24 kg/m.    ECOG FS:0 - Asymptomatic  General: Well-developed, well-nourished, no acute distress. Eyes: Pink conjunctiva, anicteric sclera. HEENT: Normocephalic, moist mucous membranes. Lungs: No audible wheezing or coughing. Heart: Regular rate and rhythm. Abdomen: Soft, nontender, no obvious distention. Musculoskeletal: No edema, cyanosis, or clubbing. Neuro: Alert, answering all questions appropriately. Cranial nerves grossly intact. Skin: No rashes or petechiae noted. Psych: Normal affect.   LAB RESULTS:  Lab Results  Component Value Date   NA 135 04/08/2021   K 3.5 04/08/2021   CL 106 04/08/2021   CO2 23 04/08/2021   GLUCOSE 109 (H) 04/08/2021   BUN 7 04/08/2021   CREATININE 0.78 04/08/2021   CALCIUM 9.0 04/08/2021   PROT 7.3 04/08/2021   ALBUMIN 4.0 04/08/2021   AST 16 04/08/2021   ALT 11 04/08/2021   ALKPHOS 68 04/08/2021   BILITOT 0.1 (L) 04/08/2021   GFRNONAA >60 04/08/2021   GFRAA >60 12/01/2017    Lab Results  Component Value Date   WBC 4.2 04/08/2021   NEUTROABS 2.5 04/08/2021   HGB 11.1 (L) 04/08/2021   HCT 34.7 (L) 04/08/2021   MCV 101.5 (H) 04/08/2021   PLT 261 04/08/2021     STUDIES: No results found.  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  PLAN:     1.  Bilateral breast cancer: Case discussed with pathology and this appears to be 2 distinct breast cancer primaries in her right and left breast both of which are ER/PR, HER2 negative malignancies.  CT scan completed on December 30, 2020 reviewed independently and reported as above did not reveal any metastatic disease.  MUGA scan on January 08, 2021 reported an EF of 57.3%.  Patient has also had port placement.  Previously, surgery recommended upfront systemic therapy. Given patient's age, she will also require genetic testing.  Plan to give neoadjuvant Adriamycin plus Cytoxan every 2 weeks with Neulasta support for 4 cycles followed by weekly Taxol for 12 cycles.  Patient also may benefit from adjuvant Xeloda.  Patient completed cycle 4 of Adriamycin and Cytoxan on March 05, 2021.  Proceed with cycle 4 of  12 of weekly Taxol today.  Return to clinic in 1 week for further evaluation and consideration of cycle 5. 2.  Nausea: Resolved.  Continue Compazine as needed. 3.  Anxiety/insomnia: Patient does not complain of this today.  Continue Xanax as needed.   4.  Leukopenia: Resolved. 5.  Anemia: Hemoglobin is trended up to 11.1, monitor. 6.  Pain: Patient does not complain of this today.  Continue ibuprofen sparingly as needed. 8.  Hypokalemia: Resolved.  Patient expressed understanding and was in agreement with this plan. She also understands that She can call clinic at any time with any questions, concerns, or complaints.    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HCC) Staging form: Breast, AJCC 8th Edition - Clinical stage from 01/12/2021: Stage IIB (cT2, cN1, cM0, G3, ER+, PR+, HER2-) - Signed by Lloyd Huger, MD on 01/12/2021 Stage prefix: Initial diagnosis Histologic grading system: 3 grade system  Invasive ductal carcinoma of right breast [facility]) Staging form: Breast, AJCC 8th Edition - Clinical stage from 01/12/2021: Stage IB (cT2, cN0, cM0, G2, ER+, PR+, HER2-) -  Signed by Lloyd Huger, MD on 01/12/2021 Stage prefix: Initial diagnosis Histologic grading system: 3 grade system  Lloyd Huger, MD   04/09/2021 9:47 AM

## 2021-04-08 ENCOUNTER — Other Ambulatory Visit: Payer: Self-pay

## 2021-04-08 ENCOUNTER — Inpatient Hospital Stay (HOSPITAL_BASED_OUTPATIENT_CLINIC_OR_DEPARTMENT_OTHER): Payer: Medicaid Other | Admitting: Oncology

## 2021-04-08 ENCOUNTER — Inpatient Hospital Stay: Payer: Medicaid Other

## 2021-04-08 VITALS — BP 151/112 | HR 92

## 2021-04-08 VITALS — BP 147/112 | HR 93 | Temp 97.2°F | Resp 16 | Wt 139.8 lb

## 2021-04-08 DIAGNOSIS — C50912 Malignant neoplasm of unspecified site of left female breast: Secondary | ICD-10-CM

## 2021-04-08 DIAGNOSIS — Z5111 Encounter for antineoplastic chemotherapy: Secondary | ICD-10-CM | POA: Diagnosis not present

## 2021-04-08 DIAGNOSIS — C50911 Malignant neoplasm of unspecified site of right female breast: Secondary | ICD-10-CM

## 2021-04-08 LAB — COMPREHENSIVE METABOLIC PANEL
ALT: 11 U/L (ref 0–44)
AST: 16 U/L (ref 15–41)
Albumin: 4 g/dL (ref 3.5–5.0)
Alkaline Phosphatase: 68 U/L (ref 38–126)
Anion gap: 6 (ref 5–15)
BUN: 7 mg/dL (ref 6–20)
CO2: 23 mmol/L (ref 22–32)
Calcium: 9 mg/dL (ref 8.9–10.3)
Chloride: 106 mmol/L (ref 98–111)
Creatinine, Ser: 0.78 mg/dL (ref 0.44–1.00)
GFR, Estimated: >60 mL/min (ref >60)
Glucose, Bld: 109 mg/dL — ABNORMAL HIGH (ref 70–99)
Potassium: 3.5 mmol/L (ref 3.5–5.1)
Sodium: 135 mmol/L (ref 135–145)
Total Bilirubin: 0.1 mg/dL — ABNORMAL LOW (ref 0.3–1.2)
Total Protein: 7.3 g/dL (ref 6.5–8.1)

## 2021-04-08 LAB — CBC WITH DIFFERENTIAL/PLATELET
Abs Immature Granulocytes: 0.01 10*3/uL (ref 0.00–0.07)
Basophils Absolute: 0 10*3/uL (ref 0.0–0.1)
Basophils Relative: 1 %
Eosinophils Absolute: 0.2 10*3/uL (ref 0.0–0.5)
Eosinophils Relative: 4 %
HCT: 34.7 % — ABNORMAL LOW (ref 36.0–46.0)
Hemoglobin: 11.1 g/dL — ABNORMAL LOW (ref 12.0–15.0)
Immature Granulocytes: 0 %
Lymphocytes Relative: 25 %
Lymphs Abs: 1 10*3/uL (ref 0.7–4.0)
MCH: 32.5 pg (ref 26.0–34.0)
MCHC: 32 g/dL (ref 30.0–36.0)
MCV: 101.5 fL — ABNORMAL HIGH (ref 80.0–100.0)
Monocytes Absolute: 0.5 10*3/uL (ref 0.1–1.0)
Monocytes Relative: 12 %
Neutro Abs: 2.5 10*3/uL (ref 1.7–7.7)
Neutrophils Relative %: 58 %
Platelets: 261 10*3/uL (ref 150–400)
RBC: 3.42 MIL/uL — ABNORMAL LOW (ref 3.87–5.11)
RDW: 19.5 % — ABNORMAL HIGH (ref 11.5–15.5)
WBC: 4.2 10*3/uL (ref 4.0–10.5)
nRBC: 0 % (ref 0.0–0.2)

## 2021-04-08 MED ORDER — SODIUM CHLORIDE 0.9 % IV SOLN
Freq: Once | INTRAVENOUS | Status: AC
  Filled 2021-04-08: qty 250

## 2021-04-08 MED ORDER — HEPARIN SOD (PORK) LOCK FLUSH 100 UNIT/ML IV SOLN
500.0000 [IU] | Freq: Once | INTRAVENOUS | Status: AC | PRN
  Administered 2021-04-08: 500 [IU]
  Filled 2021-04-08: qty 5

## 2021-04-08 MED ORDER — FAMOTIDINE IN NACL 20-0.9 MG/50ML-% IV SOLN
20.0000 mg | Freq: Once | INTRAVENOUS | Status: AC
  Administered 2021-04-08: 20 mg via INTRAVENOUS
  Filled 2021-04-08: qty 50

## 2021-04-08 MED ORDER — SODIUM CHLORIDE 0.9 % IV SOLN
10.0000 mg | Freq: Once | INTRAVENOUS | Status: AC
  Administered 2021-04-08: 10 mg via INTRAVENOUS
  Filled 2021-04-08: qty 10

## 2021-04-08 MED ORDER — DIPHENHYDRAMINE HCL 50 MG/ML IJ SOLN
25.0000 mg | Freq: Once | INTRAMUSCULAR | Status: AC
  Administered 2021-04-08: 25 mg via INTRAVENOUS
  Filled 2021-04-08: qty 1

## 2021-04-08 MED ORDER — SODIUM CHLORIDE 0.9 % IV SOLN
80.0000 mg/m2 | Freq: Once | INTRAVENOUS | Status: AC
  Administered 2021-04-08: 132 mg via INTRAVENOUS
  Filled 2021-04-08: qty 22

## 2021-04-08 NOTE — Patient Instructions (Signed)
[facility]  Discharge Instructions: ?Thank you for choosing [facility] to provide your oncology and hematology care.  ?If you have a lab appointment with the Cancer Center, please go directly to the Cancer Center and check in at the registration area. ? ?Wear comfortable clothing and clothing appropriate for easy access to any Portacath or PICC line.  ? ?We strive to give you quality time with your provider. You may need to reschedule your appointment if you arrive late (15 or more minutes).  Arriving late affects you and other patients whose appointments are after yours.  Also, if you miss three or more appointments without notifying the office, you may be dismissed from the clinic at the provider?s discretion.    ?  ?For prescription refill requests, have your pharmacy contact our office and allow 72 hours for refills to be completed.   ? ?  ?To help prevent nausea and vomiting after your treatment, we encourage you to take your nausea medication as directed. ? ?BELOW ARE SYMPTOMS THAT SHOULD BE REPORTED IMMEDIATELY: ?*FEVER GREATER THAN 100.4 F (38 ?C) OR HIGHER ?*CHILLS OR SWEATING ?*NAUSEA AND VOMITING THAT IS NOT CONTROLLED WITH YOUR NAUSEA MEDICATION ?*UNUSUAL SHORTNESS OF BREATH ?*UNUSUAL BRUISING OR BLEEDING ?*URINARY PROBLEMS (pain or burning when urinating, or frequent urination) ?*BOWEL PROBLEMS (unusual diarrhea, constipation, pain near the anus) ?TENDERNESS IN MOUTH AND THROAT WITH OR WITHOUT PRESENCE OF ULCERS (sore throat, sores in mouth, or a toothache) ?UNUSUAL RASH, SWELLING OR PAIN  ?UNUSUAL VAGINAL DISCHARGE OR ITCHING  ? ?Items with * indicate a potential emergency and should be followed up as soon as possible or go to the Emergency Department if any problems should occur. ? ?Please show the CHEMOTHERAPY ALERT CARD or IMMUNOTHERAPY ALERT CARD at check-in to the Emergency Department and triage nurse. ? ?Should you have questions after your visit  or need to cancel or reschedule your appointment, please contact [facility]  [phone] and follow the prompts.  Office hours are 8:00 a.m. to 4:30 p.m. Monday - Friday. Please note that voicemails left after 4:00 p.m. may not be returned until the following business day.  We are closed weekends and major holidays. You have access to a nurse at all times for urgent questions. Please call the main number to the clinic [phone] and follow the prompts. ? ?For any non-urgent questions, you may also contact your provider using MyChart. We now offer e-Visits for anyone 18 and older to request care online for non-urgent symptoms. For details visit [email or web address]. ?  ?Also download the MyChart app! Go to the app store, search "MyChart", open the app, select [place], and log in with your MyChart username and password. ? ?Due to Covid, a mask is required upon entering the hospital/clinic. If you do not have a mask, one will be given to you upon arrival. For doctor visits, patients may have 1 support person aged 18 or older with them. For treatment visits, patients cannot have anyone with them due to current Covid guidelines and our immunocompromised population.  ?

## 2021-04-08 NOTE — Progress Notes (Signed)
All labs and vitals reviewed by MD. Per Dr. Grayland Ormond, ok to proceed with treatment today.

## 2021-04-09 ENCOUNTER — Encounter: Payer: Self-pay | Admitting: Oncology

## 2021-04-12 NOTE — Progress Notes (Signed)
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  INTERVAL HISTORY: Patient returns to clinic today for further evaluation and consideration of cycle 5 of 12 of weekly Taxol.  She continues to tolerate her treatments well without significant side effects.  She currently feels well and is asymptomatic.  She has no neurologic complaints.  She denies any recent fevers or illnesses.  She has a good appetite and denies weight loss.  She has no chest pain, shortness of breath, cough, or hemoptysis.  She denies any nausea, vomiting, constipation, or diarrhea.  She has no urinary complaints.  Patient offers no specific complaints today.  REVIEW OF SYSTEMS:   Review of Systems  Constitutional: Negative.  Negative for chills, fever and malaise/fatigue.  Respiratory: Negative.  Negative for cough, hemoptysis and shortness of breath.   Cardiovascular: Negative.  Negative for chest pain and leg swelling.  Gastrointestinal: Negative.  Negative for abdominal pain and nausea.  Genitourinary: Negative.  Negative for dysuria.  Musculoskeletal: Negative.  Negative for back pain.  Skin: Negative.  Negative for rash.  Neurological: Negative.  Negative for dizziness, focal weakness, weakness and headaches.  Psychiatric/Behavioral: Negative.  The patient is not nervous/anxious and does not have insomnia.    As per HPI. Otherwise, a complete review of systems is negative.  PAST MEDICAL HISTORY: Past Medical History:  Diagnosis Date   Anxiety    Headache    Hypertension    Noncompliance     PAST SURGICAL HISTORY: Past Surgical History:  Procedure Laterality Date   BREAST BIOPSY  Right 12/24/2020   mass, 9:00 8 cmfn venus marker, path pending   BREAST BIOPSY Left 12/24/2020   mass 3:00 retroareolar, ribbon, path pending   BREAST BIOPSY Left 12/24/2020   mass 3:00 5cmfn, heart marker, path pending   BREAST BIOPSY Left 12/24/2020   axilla LN, hyrdomarker shape 3 coil, path pending   PORTACATH PLACEMENT Right 01/01/2021   Procedure: INSERTION PORT-A-CATH;  Surgeon: Ronny Bacon, MD;  Location: [facility] ORS;  Service: General;  Laterality: Right;   TUBAL LIGATION      FAMILY HISTORY: Family History  Problem Relation Age of Onset   COPD Mother    Hypertension Mother    Heart disease Father     ADVANCED DIRECTIVES (Y/N):  N  HEALTH MAINTENANCE: Social History   Tobacco Use   Smoking status: Every Day    Packs/day: 1.50    Types: Cigarettes   Smokeless tobacco: Never  Vaping Use   Vaping Use: Never used  Substance Use Topics   Alcohol use: Yes    Alcohol/week: 14.0 standard drinks    Types: 14 Cans of beer per week    Comment: at least 2 beers daily   Drug use: No     Colonoscopy:  PAP:  Bone density:  Lipid panel:  Allergies  Allergen Reactions   Codeine Itching and Nausea And Vomiting    Current Outpatient Medications  Medication Sig Dispense Refill   acetaminophen (TYLENOL) 325 MG tablet Take 650 mg by mouth every 6 (six) hours as needed.     ALPRAZolam (XANAX) 0.25 MG tablet Take 1 tablet (0.25 mg total) by mouth 2 (two) times daily as needed for  anxiety. 60 tablet 2   bacitracin ointment Apply 1 application topically 2 (two) times daily. 120 g 0   diphenhydrAMINE HCl (BENADRYL ALLERGY PO) Take by mouth.     diphenhydrAMINE-zinc acetate (BENADRYL EXTRA STRENGTH) cream Apply 1 application topically 3 (three) times daily as needed for itching. 28.4 g 0   ibuprofen (ADVIL) 800 MG tablet Take 1 tablet (800 mg total) by mouth every 8 (eight) hours as needed. 30 tablet 0   labetalol (NORMODYNE) 100 MG tablet Take 1 tablet (100 mg total) by  mouth 2 (two) times daily. 60 tablet 1   lidocaine-prilocaine (EMLA) cream Apply to affected area once 30 g 3   loratadine (CLARITIN) 10 MG tablet Take 10 mg by mouth daily.     ondansetron (ZOFRAN) 8 MG tablet Take 1 tablet (8 mg total) by mouth 2 (two) times daily. 60 tablet 2   prednisoLONE (ORAPRED) 15 MG/5ML solution Take by mouth.     prochlorperazine (COMPAZINE) 10 MG tablet Take 1 tablet (10 mg total) by mouth every 6 (six) hours as needed for nausea or vomiting. 60 tablet 2   No current facility-administered medications for this visit.   Facility-Administered Medications Ordered in Other Visits  Medication Dose Route Frequency Provider Last Rate Last Admin   heparin lock flush 100 unit/mL  500 Units Intracatheter Once PRN Lloyd Huger, MD        OBJECTIVE: Vitals:   04/15/21 0946  BP: (!) 147/111  Pulse: 92  Resp: 16  Temp: (!) 96.9 F (36.1 C)  SpO2: 98%     Body mass index is 23.69 kg/m.    ECOG FS:0 - Asymptomatic  General: Well-developed, well-nourished, no acute distress. Eyes: Pink conjunctiva, anicteric sclera. HEENT: Normocephalic, moist mucous membranes. Lungs: No audible wheezing or coughing. Heart: Regular rate and rhythm. Abdomen: Soft, nontender, no obvious distention. Musculoskeletal: No edema, cyanosis, or clubbing. Neuro: Alert, answering all questions appropriately. Cranial nerves grossly intact. Skin: No rashes or petechiae noted. Psych: Normal affect.   LAB RESULTS:  Lab Results  Component Value Date   NA 134 (L) 04/15/2021   K 3.8 04/15/2021   CL 105 04/15/2021   CO2 26 04/15/2021   GLUCOSE 105 (H) 04/15/2021   BUN 7 04/15/2021   CREATININE 0.67 04/15/2021   CALCIUM 9.2 04/15/2021   PROT 7.3 04/15/2021   ALBUMIN 4.0 04/15/2021   AST 17 04/15/2021   ALT 13 04/15/2021   ALKPHOS 65 04/15/2021   BILITOT 0.4 04/15/2021   GFRNONAA >60 04/15/2021   GFRAA >60 12/01/2017    Lab Results  Component Value Date   WBC 3.3 (L)  04/15/2021   NEUTROABS 1.8 04/15/2021   HGB 11.4 (L) 04/15/2021   HCT 34.9 (L) 04/15/2021   MCV 103.9 (H) 04/15/2021   PLT 230 04/15/2021     STUDIES: No results found.  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  PLAN:    1.  Bilateral breast cancer: Case discussed with pathology and this appears to be 2 distinct breast cancer primaries in her right and left breast both of which are ER/PR, HER2 negative malignancies.  CT scan completed on December 30, 2020 reviewed independently and reported as above did not reveal any metastatic disease.  MUGA scan on January 08, 2021 reported an EF of 57.3%.  Patient has also had port placement.  Previously, surgery recommended upfront systemic therapy. Given patient's age, she will also require genetic testing.  Plan to give neoadjuvant Adriamycin plus Cytoxan every 2 weeks  with Neulasta support for 4 cycles followed by weekly Taxol for 12 cycles.  Patient also may benefit from adjuvant Xeloda.  Patient completed cycle 4 of Adriamycin and Cytoxan on March 05, 2021.  Proceed with cycle 5 of treatment today.  Return to clinic in 1 week for further evaluation and consideration of cycle 6.   2.  Nausea: Resolved.  Continue Compazine as needed. 3.  Anxiety/insomnia: Patient does not complain of this today.  Continue Xanax as needed.   4.  Leukopenia: Mild, monitor.   5.  Anemia: Hemoglobin continues to trend up and is now 11.4. 6.  Pain: Patient does not complain of this today.  Continue ibuprofen sparingly as needed. 8.  Hypokalemia: Resolved.  Patient expressed understanding and was in agreement with this plan. She also understands that She can call clinic at any time with any questions, concerns, or complaints.    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

## 2021-04-15 ENCOUNTER — Inpatient Hospital Stay: Payer: Medicaid Other

## 2021-04-15 ENCOUNTER — Encounter: Payer: Self-pay | Admitting: Oncology

## 2021-04-15 ENCOUNTER — Inpatient Hospital Stay (HOSPITAL_BASED_OUTPATIENT_CLINIC_OR_DEPARTMENT_OTHER): Payer: Medicaid Other | Admitting: Oncology

## 2021-04-15 ENCOUNTER — Other Ambulatory Visit: Payer: Self-pay

## 2021-04-15 VITALS — BP 147/111 | HR 92 | Temp 96.9°F | Resp 16 | Ht 64.0 in | Wt 138.0 lb

## 2021-04-15 DIAGNOSIS — C50911 Malignant neoplasm of unspecified site of right female breast: Secondary | ICD-10-CM

## 2021-04-15 DIAGNOSIS — Z5111 Encounter for antineoplastic chemotherapy: Secondary | ICD-10-CM | POA: Diagnosis not present

## 2021-04-15 DIAGNOSIS — C50912 Malignant neoplasm of unspecified site of left female breast: Secondary | ICD-10-CM | POA: Diagnosis not present

## 2021-04-15 LAB — COMPREHENSIVE METABOLIC PANEL
ALT: 13 U/L (ref 0–44)
AST: 17 U/L (ref 15–41)
Albumin: 4 g/dL (ref 3.5–5.0)
Alkaline Phosphatase: 65 U/L (ref 38–126)
Anion gap: 3 — ABNORMAL LOW (ref 5–15)
BUN: 7 mg/dL (ref 6–20)
CO2: 26 mmol/L (ref 22–32)
Calcium: 9.2 mg/dL (ref 8.9–10.3)
Chloride: 105 mmol/L (ref 98–111)
Creatinine, Ser: 0.67 mg/dL (ref 0.44–1.00)
GFR, Estimated: >60 mL/min (ref >60)
Glucose, Bld: 105 mg/dL — ABNORMAL HIGH (ref 70–99)
Potassium: 3.8 mmol/L (ref 3.5–5.1)
Sodium: 134 mmol/L — ABNORMAL LOW (ref 135–145)
Total Bilirubin: 0.4 mg/dL (ref 0.3–1.2)
Total Protein: 7.3 g/dL (ref 6.5–8.1)

## 2021-04-15 LAB — CBC WITH DIFFERENTIAL/PLATELET
Abs Immature Granulocytes: 0.01 10*3/uL (ref 0.00–0.07)
Basophils Absolute: 0 10*3/uL (ref 0.0–0.1)
Basophils Relative: 1 %
Eosinophils Absolute: 0.1 10*3/uL (ref 0.0–0.5)
Eosinophils Relative: 4 %
HCT: 34.9 % — ABNORMAL LOW (ref 36.0–46.0)
Hemoglobin: 11.4 g/dL — ABNORMAL LOW (ref 12.0–15.0)
Immature Granulocytes: 0 %
Lymphocytes Relative: 28 %
Lymphs Abs: 0.9 10*3/uL (ref 0.7–4.0)
MCH: 33.9 pg (ref 26.0–34.0)
MCHC: 32.7 g/dL (ref 30.0–36.0)
MCV: 103.9 fL — ABNORMAL HIGH (ref 80.0–100.0)
Monocytes Absolute: 0.4 10*3/uL (ref 0.1–1.0)
Monocytes Relative: 13 %
Neutro Abs: 1.8 10*3/uL (ref 1.7–7.7)
Neutrophils Relative %: 54 %
Platelets: 230 10*3/uL (ref 150–400)
RBC: 3.36 MIL/uL — ABNORMAL LOW (ref 3.87–5.11)
RDW: 18.2 % — ABNORMAL HIGH (ref 11.5–15.5)
WBC: 3.3 10*3/uL — ABNORMAL LOW (ref 4.0–10.5)
nRBC: 0 % (ref 0.0–0.2)

## 2021-04-15 MED ORDER — FAMOTIDINE IN NACL 20-0.9 MG/50ML-% IV SOLN
20.0000 mg | Freq: Once | INTRAVENOUS | Status: AC
  Administered 2021-04-15: 20 mg via INTRAVENOUS
  Filled 2021-04-15: qty 50

## 2021-04-15 MED ORDER — SODIUM CHLORIDE 0.9 % IV SOLN
Freq: Once | INTRAVENOUS | Status: AC
  Filled 2021-04-15: qty 250

## 2021-04-15 MED ORDER — SODIUM CHLORIDE 0.9 % IV SOLN
10.0000 mg | Freq: Once | INTRAVENOUS | Status: AC
  Administered 2021-04-15: 10 mg via INTRAVENOUS
  Filled 2021-04-15: qty 10

## 2021-04-15 MED ORDER — HEPARIN SOD (PORK) LOCK FLUSH 100 UNIT/ML IV SOLN
500.0000 [IU] | Freq: Once | INTRAVENOUS | Status: DC | PRN
  Filled 2021-04-15: qty 5

## 2021-04-15 MED ORDER — SODIUM CHLORIDE 0.9 % IV SOLN
80.0000 mg/m2 | Freq: Once | INTRAVENOUS | Status: AC
  Administered 2021-04-15: 132 mg via INTRAVENOUS
  Filled 2021-04-15: qty 22

## 2021-04-15 MED ORDER — HEPARIN SOD (PORK) LOCK FLUSH 100 UNIT/ML IV SOLN
INTRAVENOUS | Status: AC
  Administered 2021-04-15: 500 [IU]
  Filled 2021-04-15: qty 5

## 2021-04-15 MED ORDER — DIPHENHYDRAMINE HCL 50 MG/ML IJ SOLN
25.0000 mg | Freq: Once | INTRAMUSCULAR | Status: AC
  Administered 2021-04-15: 25 mg via INTRAVENOUS
  Filled 2021-04-15: qty 1

## 2021-04-15 NOTE — Patient Instructions (Signed)
[facility] [place]  Discharge Instructions: Thank you for choosing [facility] to provide your oncology and hematology care.  If you have a lab appointment with the [facility], please go directly to the [facility] and check in at the registration area.  Wear comfortable clothing and clothing appropriate for easy access to any Portacath or PICC line.   We strive to give you quality time with your provider. You may need to reschedule your appointment if you arrive late (15 or more minutes).  Arriving late affects you and other patients whose appointments are after yours.  Also, if you miss three or more appointments without notifying the office, you may be dismissed from the clinic at the providers discretion.      For prescription refill requests, have your pharmacy contact our office and allow 72 hours for refills to be completed.    Today you received the following chemotherapy and/or immunotherapy agents: Taxol      To help prevent nausea and vomiting after your treatment, we encourage you to take your nausea medication as directed.  BELOW ARE SYMPTOMS THAT SHOULD BE REPORTED IMMEDIATELY: *FEVER GREATER THAN 100.4 F (38 C) OR HIGHER *CHILLS OR SWEATING *NAUSEA AND VOMITING THAT IS NOT CONTROLLED WITH YOUR NAUSEA MEDICATION *UNUSUAL SHORTNESS OF BREATH *UNUSUAL BRUISING OR BLEEDING *URINARY PROBLEMS (pain or burning when urinating, or frequent urination) *BOWEL PROBLEMS (unusual diarrhea, constipation, pain near the anus) TENDERNESS IN MOUTH AND THROAT WITH OR WITHOUT PRESENCE OF ULCERS (sore throat, sores in mouth, or a toothache) UNUSUAL RASH, SWELLING OR PAIN  UNUSUAL VAGINAL DISCHARGE OR ITCHING   Items with * indicate a potential emergency and should be followed up as soon as possible or go to the Emergency Department if any problems should occur.  Please show the CHEMOTHERAPY ALERT CARD or IMMUNOTHERAPY ALERT CARD at check-in to the  Emergency Department and triage nurse.  Should you have questions after your visit or need to cancel or reschedule your appointment, please contact [facility] [place] [facility] [place]  [phone] and follow the prompts.  Office hours are 8:00 a.m. to 4:30 p.m. Monday - Friday. Please note that voicemails left after 4:00 p.m. may not be returned until the following business day.  We are closed weekends and major holidays. You have access to a nurse at all times for urgent questions. Please call the main number to the clinic [phone] and follow the prompts.  For any non-urgent questions, you may also contact your provider using MyChart. We now offer e-Visits for anyone 12 and older to request care online for non-urgent symptoms. For details visit [email or web address].   Also download the MyChart app! Go to the app store, search "MyChart", open the app, select [facility], and log in with your MyChart username and password.  Due to Covid, a mask is required upon entering the hospital/clinic. If you do not have a mask, one will be given to you upon arrival. For doctor visits, patients may have 1 support person aged 77 or older with them. For treatment visits, patients cannot have anyone with them due to current Covid guidelines and our immunocompromised population. Paclitaxel injection What is this medication? PACLITAXEL (PAK li TAX el) is a chemotherapy drug. It targets fast dividing cells, like cancer cells, and causes these cells to die. This medicine is used to treat ovarian cancer, breast cancer, lung cancer, Kaposi's sarcoma, and other cancers. This medicine may be used for other purposes; ask your health  care provider or pharmacist if you have questions. COMMON BRAND NAME(S): Onxol, Taxol What should I tell my care team before I take this medication? They need to know if you have any of these conditions: history of irregular heartbeat liver disease low blood counts, like low  white cell, platelet, or red cell counts lung or breathing disease, like asthma tingling of the fingers or toes, or other nerve disorder an unusual or allergic reaction to paclitaxel, alcohol, polyoxyethylated castor oil, other chemotherapy, other medicines, foods, dyes, or preservatives pregnant or trying to get pregnant breast-feeding How should I use this medication? This drug is given as an infusion into a vein. It is administered in a hospital or clinic by a specially trained health care professional. Talk to your pediatrician regarding the use of this medicine in children. Special care may be needed. Overdosage: If you think you have taken too much of this medicine contact a poison control center or emergency room at once. NOTE: This medicine is only for you. Do not share this medicine with others. What if I miss a dose? It is important not to miss your dose. Call your doctor or health care professional if you are unable to keep an appointment. What may interact with this medication? Do not take this medicine with any of the following medications: live virus vaccines This medicine may also interact with the following medications: antiviral medicines for hepatitis, HIV or AIDS certain antibiotics like erythromycin and clarithromycin certain medicines for fungal infections like ketoconazole and itraconazole certain medicines for seizures like carbamazepine, phenobarbital, phenytoin gemfibrozil nefazodone rifampin St. John's wort This list may not describe all possible interactions. Give your health care provider a list of all the medicines, herbs, non-prescription drugs, or dietary supplements you use. Also tell them if you smoke, drink alcohol, or use illegal drugs. Some items may interact with your medicine. What should I watch for while using this medication? Your condition will be monitored carefully while you are receiving this medicine. You will need important blood work done  while you are taking this medicine. This medicine can cause serious allergic reactions. To reduce your risk you will need to take other medicine(s) before treatment with this medicine. If you experience allergic reactions like skin rash, itching or hives, swelling of the face, lips, or tongue, tell your doctor or health care professional right away. In some cases, you may be given additional medicines to help with side effects. Follow all directions for their use. This drug may make you feel generally unwell. This is not uncommon, as chemotherapy can affect healthy cells as well as cancer cells. Report any side effects. Continue your course of treatment even though you feel ill unless your doctor tells you to stop. Call your doctor or health care professional for advice if you get a fever, chills or sore throat, or other symptoms of a cold or flu. Do not treat yourself. This drug decreases your body's ability to fight infections. Try to avoid being around people who are sick. This medicine may increase your risk to bruise or bleed. Call your doctor or health care professional if you notice any unusual bleeding. Be careful brushing and flossing your teeth or using a toothpick because you may get an infection or bleed more easily. If you have any dental work done, tell your dentist you are receiving this medicine. Avoid taking products that contain aspirin, acetaminophen, ibuprofen, naproxen, or ketoprofen unless instructed by your doctor. These medicines may hide a fever. Do  not become pregnant while taking this medicine. Women should inform their doctor if they wish to become pregnant or think they might be pregnant. There is a potential for serious side effects to an unborn child. Talk to your health care professional or pharmacist for more information. Do not breast-feed an infant while taking this medicine. Men are advised not to father a child while receiving this medicine. This product may contain  alcohol. Ask your pharmacist or healthcare provider if this medicine contains alcohol. Be sure to tell all healthcare providers you are taking this medicine. Certain medicines, like metronidazole and disulfiram, can cause an unpleasant reaction when taken with alcohol. The reaction includes flushing, headache, nausea, vomiting, sweating, and increased thirst. The reaction can last from 30 minutes to several hours. What side effects may I notice from receiving this medication? Side effects that you should report to your doctor or health care professional as soon as possible: allergic reactions like skin rash, itching or hives, swelling of the face, lips, or tongue breathing problems changes in vision fast, irregular heartbeat high or low blood pressure mouth sores pain, tingling, numbness in the hands or feet signs of decreased platelets or bleeding - bruising, pinpoint red spots on the skin, black, tarry stools, blood in the urine signs of decreased red blood cells - unusually weak or tired, feeling faint or lightheaded, falls signs of infection - fever or chills, cough, sore throat, pain or difficulty passing urine signs and symptoms of liver injury like dark yellow or brown urine; general ill feeling or flu-like symptoms; light-colored stools; loss of appetite; nausea; right upper belly pain; unusually weak or tired; yellowing of the eyes or skin swelling of the ankles, feet, hands unusually slow heartbeat Side effects that usually do not require medical attention (report to your doctor or health care professional if they continue or are bothersome): diarrhea hair loss loss of appetite muscle or joint pain nausea, vomiting pain, redness, or irritation at site where injected tiredness This list may not describe all possible side effects. Call your doctor for medical advice about side effects. You may report side effects to FDA at 1-800-FDA-1088. Where should I keep my medication? This drug  is given in a hospital or clinic and will not be stored at home. NOTE: This sheet is a summary. It may not cover all possible information. If you have questions about this medicine, talk to your doctor, pharmacist, or health care provider.  2022 Elsevier/Gold Standard (2020-10-27 00:00:00)

## 2021-04-16 NOTE — Progress Notes (Signed)
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  INTERVAL HISTORY: Patient returns to clinic today for further evaluation and consideration of cycle 6 of weekly Taxol.  She has some mild tenderness in her fingernails, but denies peripheral neuropathy.  She is also having difficulty sleeping secondary to "nightmares".  She otherwise feels well.  She has no neurologic complaints.  She denies any recent fevers or illnesses.  She has a good appetite and denies weight loss.  She has no chest pain, shortness of breath, cough, or hemoptysis.  She denies any nausea, vomiting, constipation, or diarrhea.  She has no urinary complaints.  Patient offers no further specific complaints today.  REVIEW OF SYSTEMS:   Review of Systems  Constitutional: Negative.  Negative for chills, fever and malaise/fatigue.  Respiratory: Negative.  Negative for cough, hemoptysis and shortness of breath.   Cardiovascular: Negative.  Negative for chest pain and leg swelling.  Gastrointestinal: Negative.  Negative for abdominal pain and nausea.  Genitourinary: Negative.  Negative for dysuria.  Musculoskeletal: Negative.  Negative for back pain.  Skin: Negative.  Negative for rash.  Neurological: Negative.  Negative for dizziness, focal weakness, weakness and headaches.  Psychiatric/Behavioral: Negative.  The patient is not nervous/anxious and does not have insomnia.    As per HPI. Otherwise, a complete review of systems is negative.  PAST MEDICAL HISTORY: Past Medical History:  Diagnosis Date   Anxiety    Headache    Hypertension    Noncompliance     PAST SURGICAL HISTORY: Past Surgical  History:  Procedure Laterality Date   BREAST BIOPSY Right 12/24/2020   mass, 9:00 8 cmfn venus marker, path pending   BREAST BIOPSY Left 12/24/2020   mass 3:00 retroareolar, ribbon, path pending   BREAST BIOPSY Left 12/24/2020   mass 3:00 5cmfn, heart marker, path pending   BREAST BIOPSY Left 12/24/2020   axilla LN, hyrdomarker shape 3 coil, path pending   PORTACATH PLACEMENT Right 01/01/2021   Procedure: INSERTION PORT-A-CATH;  Surgeon: Ronny Bacon, MD;  Location: [facility] ORS;  Service: General;  Laterality: Right;   TUBAL LIGATION      FAMILY HISTORY: Family History  Problem Relation Age of Onset   COPD Mother    Hypertension Mother    Heart disease Father     ADVANCED DIRECTIVES (Y/N):  N  HEALTH MAINTENANCE: Social History   Tobacco Use   Smoking status: Every Day    Packs/day: 1.50    Types: Cigarettes   Smokeless tobacco: Never  Vaping Use   Vaping Use: Never used  Substance Use Topics   Alcohol use: Yes    Alcohol/week: 14.0 standard drinks    Types: 14 Cans of beer per week    Comment: at least 2 beers daily   Drug use: No     Colonoscopy:  PAP:  Bone density:  Lipid panel:  Allergies  Allergen Reactions   Codeine Itching and Nausea And Vomiting    Current Outpatient Medications  Medication Sig Dispense Refill   acetaminophen (TYLENOL) 325 MG tablet Take 650 mg by mouth every 6 (six) hours as needed.     ALPRAZolam (XANAX) 0.25 MG tablet Take 1 tablet (0.25 mg total) by mouth  2 (two) times daily as needed for anxiety. 60 tablet 2   bacitracin ointment Apply 1 application topically 2 (two) times daily. 120 g 0   diphenhydrAMINE HCl (BENADRYL ALLERGY PO) Take by mouth.     diphenhydrAMINE-zinc acetate (BENADRYL EXTRA STRENGTH) cream Apply 1 application topically 3 (three) times daily as needed for itching. 28.4 g 0   ibuprofen (ADVIL) 800 MG tablet Take 1 tablet (800 mg total) by mouth every 8 (eight) hours as needed. 30 tablet 0   labetalol  (NORMODYNE) 100 MG tablet Take 1 tablet (100 mg total) by mouth 2 (two) times daily. 60 tablet 1   lidocaine-prilocaine (EMLA) cream Apply to affected area once 30 g 3   loratadine (CLARITIN) 10 MG tablet Take 10 mg by mouth daily.     ondansetron (ZOFRAN) 8 MG tablet Take 1 tablet (8 mg total) by mouth 2 (two) times daily. 60 tablet 2   prednisoLONE (ORAPRED) 15 MG/5ML solution Take by mouth.     prochlorperazine (COMPAZINE) 10 MG tablet Take 1 tablet (10 mg total) by mouth every 6 (six) hours as needed for nausea or vomiting. 60 tablet 2   No current facility-administered medications for this visit.   Facility-Administered Medications Ordered in Other Visits  Medication Dose Route Frequency Provider Last Rate Last Admin   dexamethasone (DECADRON) 10 mg in sodium chloride 0.9 % 50 mL IVPB  10 mg Intravenous Once Lloyd Huger, MD       diphenhydrAMINE (BENADRYL) injection 25 mg  25 mg Intravenous Once Lloyd Huger, MD       famotidine (PEPCID) IVPB 20 mg premix  20 mg Intravenous Once Lloyd Huger, MD       heparin lock flush 100 UNIT/ML injection            heparin lock flush 100 unit/mL  500 Units Intracatheter Once PRN Lloyd Huger, MD       PACLitaxel (TAXOL) 132 mg in sodium chloride 0.9 % 250 mL chemo infusion (</= 53m/m2)  80 mg/m2 (Treatment Plan Recorded) Intravenous Once FLloyd Huger MD       sodium chloride flush (NS) 0.9 % injection 10 mL  10 mL Intracatheter PRN FLloyd Huger MD   10 mL at 04/22/21 0951    OBJECTIVE: Vitals:   04/22/21 0903  BP: (!) 145/106  Pulse: 92  Temp: 98.6 F (37 C)     Body mass index is 24.15 kg/m.    ECOG FS:0 - Asymptomatic  General: Well-developed, well-nourished, no acute distress. Eyes: Pink conjunctiva, anicteric sclera. HEENT: Normocephalic, moist mucous membranes. Lungs: No audible wheezing or coughing. Heart: Regular rate and rhythm. Abdomen: Soft, nontender, no obvious  distention. Musculoskeletal: No edema, cyanosis, or clubbing. Neuro: Alert, answering all questions appropriately. Cranial nerves grossly intact. Skin: No rashes or petechiae noted. Psych: Normal affect.  LAB RESULTS:  Lab Results  Component Value Date   NA 135 04/22/2021   K 3.3 (L) 04/22/2021   CL 104 04/22/2021   CO2 22 04/22/2021   GLUCOSE 128 (H) 04/22/2021   BUN 10 04/22/2021   CREATININE 0.73 04/22/2021   CALCIUM 9.0 04/22/2021   PROT 6.9 04/22/2021   ALBUMIN 3.8 04/22/2021   AST 19 04/22/2021   ALT 10 04/22/2021   ALKPHOS 62 04/22/2021   BILITOT 0.4 04/22/2021   GFRNONAA >60 04/22/2021   GFRAA >60 12/01/2017    Lab Results  Component Value Date   WBC 4.2 04/22/2021   NEUTROABS 2.8  04/22/2021   HGB 12.1 04/22/2021   HCT 36.8 04/22/2021   MCV 104.0 (H) 04/22/2021   PLT 217 04/22/2021     STUDIES: No results found.  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  PLAN:    1.  Bilateral breast cancer: Case discussed with pathology and this appears to be 2 distinct breast cancer primaries in her right and left breast both of which are ER/PR, HER2 negative malignancies.  CT scan completed on December 30, 2020 reviewed independently and reported as above did not reveal any metastatic disease.  MUGA scan on January 08, 2021 reported an EF of 57.3%.  Patient has also had port placement.  Previously, surgery recommended upfront systemic therapy. Given patient's age, she will also require genetic testing.  Plan to give neoadjuvant Adriamycin plus Cytoxan every 2 weeks with Neulasta support for 4 cycles followed by weekly Taxol for 12 cycles.  Patient also may benefit from adjuvant Xeloda.  Patient completed cycle 4 of Adriamycin and Cytoxan on March 05, 2021.  Proceed with cycle 6 of 12 of weekly Taxol today.  Return to clinic in 1 week for further evaluation and consideration of cycle 7. 2.  Nausea: Resolved.  Continue Compazine as needed. 3.  Anxiety/insomnia:  Patient was given a refill of her Xanax today.   4.  Leukopenia: Resolved. 5.  Anemia: Resolved.   6.  Pain: Patient does not complain of this today.  Continue ibuprofen sparingly as needed. 7.  Hypokalemia: Mild, monitor.  Patient expressed understanding and was in agreement with this plan. She also understands that She can call clinic at any time with any questions, concerns, or complaints.    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[facility]) Staging form: Breast, AJCC 8th Edition - Clinical stage from 01/12/2021: Stage IIB (cT2, cN1, cM0, G3, ER+, PR+, HER2-) - Signed by Lloyd Huger, MD on 01/12/2021 Stage prefix: Initial diagnosis Histologic grading system: 3 grade system  Invasive ductal carcinoma of right breast [facility]) Staging form: Breast, AJCC 8th Edition - Clinical stage from 01/12/2021: Stage IB (cT2, cN0, cM0, G2, ER+, PR+, HER2-) - Signed by Lloyd Huger, MD on 01/12/2021 Stage prefix: Initial diagnosis Histologic grading system: 3 grade system  Lloyd Huger, MD   04/22/2021 9:58 AM

## 2021-04-22 ENCOUNTER — Inpatient Hospital Stay (HOSPITAL_BASED_OUTPATIENT_CLINIC_OR_DEPARTMENT_OTHER): Payer: Medicaid Other | Admitting: Oncology

## 2021-04-22 ENCOUNTER — Encounter: Payer: Self-pay | Admitting: Oncology

## 2021-04-22 ENCOUNTER — Inpatient Hospital Stay: Payer: Medicaid Other | Attending: Oncology

## 2021-04-22 ENCOUNTER — Inpatient Hospital Stay: Payer: Medicaid Other

## 2021-04-22 ENCOUNTER — Other Ambulatory Visit: Payer: Self-pay | Admitting: Emergency Medicine

## 2021-04-22 ENCOUNTER — Other Ambulatory Visit: Payer: Self-pay

## 2021-04-22 VITALS — BP 145/106 | HR 92 | Temp 98.6°F | Wt 140.7 lb

## 2021-04-22 VITALS — BP 145/90 | HR 92 | Resp 18

## 2021-04-22 DIAGNOSIS — C50912 Malignant neoplasm of unspecified site of left female breast: Secondary | ICD-10-CM | POA: Diagnosis not present

## 2021-04-22 DIAGNOSIS — C50811 Malignant neoplasm of overlapping sites of right female breast: Secondary | ICD-10-CM | POA: Diagnosis present

## 2021-04-22 DIAGNOSIS — Z5111 Encounter for antineoplastic chemotherapy: Secondary | ICD-10-CM | POA: Diagnosis present

## 2021-04-22 DIAGNOSIS — I10 Essential (primary) hypertension: Secondary | ICD-10-CM | POA: Insufficient documentation

## 2021-04-22 DIAGNOSIS — Z79899 Other long term (current) drug therapy: Secondary | ICD-10-CM | POA: Diagnosis not present

## 2021-04-22 DIAGNOSIS — C50911 Malignant neoplasm of unspecified site of right female breast: Secondary | ICD-10-CM

## 2021-04-22 DIAGNOSIS — E876 Hypokalemia: Secondary | ICD-10-CM | POA: Diagnosis not present

## 2021-04-22 DIAGNOSIS — G4700 Insomnia, unspecified: Secondary | ICD-10-CM | POA: Diagnosis not present

## 2021-04-22 DIAGNOSIS — F17210 Nicotine dependence, cigarettes, uncomplicated: Secondary | ICD-10-CM | POA: Insufficient documentation

## 2021-04-22 DIAGNOSIS — C50812 Malignant neoplasm of overlapping sites of left female breast: Secondary | ICD-10-CM | POA: Diagnosis present

## 2021-04-22 DIAGNOSIS — F419 Anxiety disorder, unspecified: Secondary | ICD-10-CM | POA: Insufficient documentation

## 2021-04-22 LAB — COMPREHENSIVE METABOLIC PANEL
ALT: 10 U/L (ref 0–44)
AST: 19 U/L (ref 15–41)
Albumin: 3.8 g/dL (ref 3.5–5.0)
Alkaline Phosphatase: 62 U/L (ref 38–126)
Anion gap: 9 (ref 5–15)
BUN: 10 mg/dL (ref 6–20)
CO2: 22 mmol/L (ref 22–32)
Calcium: 9 mg/dL (ref 8.9–10.3)
Chloride: 104 mmol/L (ref 98–111)
Creatinine, Ser: 0.73 mg/dL (ref 0.44–1.00)
GFR, Estimated: >60 mL/min (ref >60)
Glucose, Bld: 128 mg/dL — ABNORMAL HIGH (ref 70–99)
Potassium: 3.3 mmol/L — ABNORMAL LOW (ref 3.5–5.1)
Sodium: 135 mmol/L (ref 135–145)
Total Bilirubin: 0.4 mg/dL (ref 0.3–1.2)
Total Protein: 6.9 g/dL (ref 6.5–8.1)

## 2021-04-22 LAB — CBC WITH DIFFERENTIAL/PLATELET
Abs Immature Granulocytes: 0.01 10*3/uL (ref 0.00–0.07)
Basophils Absolute: 0 10*3/uL (ref 0.0–0.1)
Basophils Relative: 1 %
Eosinophils Absolute: 0.2 10*3/uL (ref 0.0–0.5)
Eosinophils Relative: 4 %
HCT: 36.8 % (ref 36.0–46.0)
Hemoglobin: 12.1 g/dL (ref 12.0–15.0)
Immature Granulocytes: 0 %
Lymphocytes Relative: 19 %
Lymphs Abs: 0.8 10*3/uL (ref 0.7–4.0)
MCH: 34.2 pg — ABNORMAL HIGH (ref 26.0–34.0)
MCHC: 32.9 g/dL (ref 30.0–36.0)
MCV: 104 fL — ABNORMAL HIGH (ref 80.0–100.0)
Monocytes Absolute: 0.4 10*3/uL (ref 0.1–1.0)
Monocytes Relative: 10 %
Neutro Abs: 2.8 10*3/uL (ref 1.7–7.7)
Neutrophils Relative %: 66 %
Platelets: 217 10*3/uL (ref 150–400)
RBC: 3.54 MIL/uL — ABNORMAL LOW (ref 3.87–5.11)
RDW: 16.4 % — ABNORMAL HIGH (ref 11.5–15.5)
WBC: 4.2 10*3/uL (ref 4.0–10.5)
nRBC: 0 % (ref 0.0–0.2)

## 2021-04-22 MED ORDER — SODIUM CHLORIDE 0.9 % IV SOLN
Freq: Once | INTRAVENOUS | Status: AC
  Filled 2021-04-22: qty 250

## 2021-04-22 MED ORDER — SODIUM CHLORIDE 0.9 % IV SOLN
10.0000 mg | Freq: Once | INTRAVENOUS | Status: AC
  Administered 2021-04-22: 10 mg via INTRAVENOUS
  Filled 2021-04-22: qty 10

## 2021-04-22 MED ORDER — ALPRAZOLAM 0.25 MG PO TABS: 0.2500 mg | ORAL_TABLET | Freq: Every day | ORAL | 2 refills | Status: DC

## 2021-04-22 MED ORDER — SODIUM CHLORIDE 0.9% FLUSH
10.0000 mL | INTRAVENOUS | Status: DC | PRN
  Administered 2021-04-22: 10 mL
  Filled 2021-04-22: qty 10

## 2021-04-22 MED ORDER — HEPARIN SOD (PORK) LOCK FLUSH 100 UNIT/ML IV SOLN
INTRAVENOUS | Status: AC
  Administered 2021-04-22: 500 [IU]
  Filled 2021-04-22: qty 5

## 2021-04-22 MED ORDER — HEPARIN SOD (PORK) LOCK FLUSH 100 UNIT/ML IV SOLN
500.0000 [IU] | Freq: Once | INTRAVENOUS | Status: AC | PRN
  Filled 2021-04-22: qty 5

## 2021-04-22 MED ORDER — DIPHENHYDRAMINE HCL 50 MG/ML IJ SOLN
25.0000 mg | Freq: Once | INTRAMUSCULAR | Status: AC
  Administered 2021-04-22: 25 mg via INTRAVENOUS
  Filled 2021-04-22: qty 1

## 2021-04-22 MED ORDER — SODIUM CHLORIDE 0.9 % IV SOLN
80.0000 mg/m2 | Freq: Once | INTRAVENOUS | Status: AC
  Administered 2021-04-22: 132 mg via INTRAVENOUS
  Filled 2021-04-22: qty 22

## 2021-04-22 MED ORDER — FAMOTIDINE IN NACL 20-0.9 MG/50ML-% IV SOLN
20.0000 mg | Freq: Once | INTRAVENOUS | Status: AC
  Administered 2021-04-22: 20 mg via INTRAVENOUS
  Filled 2021-04-22: qty 50

## 2021-04-22 NOTE — Progress Notes (Signed)
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  INTERVAL HISTORY: Patient returns to clinic today for further evaluation and consideration of cycle 7 of weekly Taxol.  Since reinitiating Xanax at night, she is sleeping better and is no longer having nightmares.  She continues to have fingernail tenderness, but denies peripheral neuropathy.  She otherwise feels well.  She has no neurologic complaints.  She denies any recent fevers or illnesses.  She has a good appetite and denies weight loss.  She has no chest pain, shortness of breath, cough, or hemoptysis.  She denies any nausea, vomiting, constipation, or diarrhea.  She has no urinary complaints.  Patient offers no further specific complaints today.  REVIEW OF SYSTEMS:   Review of Systems  Constitutional: Negative.  Negative for chills, fever and malaise/fatigue.  Respiratory: Negative.  Negative for cough, hemoptysis and shortness of breath.   Cardiovascular: Negative.  Negative for chest pain and leg swelling.  Gastrointestinal: Negative.  Negative for abdominal pain and nausea.  Genitourinary: Negative.  Negative for dysuria.  Musculoskeletal: Negative.  Negative for back pain.  Skin: Negative.  Negative for rash.  Neurological: Negative.  Negative for dizziness, focal weakness, weakness and headaches.  Psychiatric/Behavioral: Negative.  The patient is not nervous/anxious and does not have insomnia.    As per HPI. Otherwise, a complete review of systems is negative.  PAST MEDICAL HISTORY: Past Medical History:  Diagnosis Date   Anxiety    Headache    Hypertension    Noncompliance     PAST SURGICAL  HISTORY: Past Surgical History:  Procedure Laterality Date   BREAST BIOPSY Right 12/24/2020   mass, 9:00 8 cmfn venus marker, path pending   BREAST BIOPSY Left 12/24/2020   mass 3:00 retroareolar, ribbon, path pending   BREAST BIOPSY Left 12/24/2020   mass 3:00 5cmfn, heart marker, path pending   BREAST BIOPSY Left 12/24/2020   axilla LN, hyrdomarker shape 3 coil, path pending   PORTACATH PLACEMENT Right 01/01/2021   Procedure: INSERTION PORT-A-CATH;  Surgeon: Ronny Bacon, MD;  Location: [facility] ORS;  Service: General;  Laterality: Right;   TUBAL LIGATION      FAMILY HISTORY: Family History  Problem Relation Age of Onset   COPD Mother    Hypertension Mother    Heart disease Father     ADVANCED DIRECTIVES (Y/N):  N  HEALTH MAINTENANCE: Social History   Tobacco Use   Smoking status: Every Day    Packs/day: 1.50    Types: Cigarettes   Smokeless tobacco: Never  Vaping Use   Vaping Use: Never used  Substance Use Topics   Alcohol use: Yes    Alcohol/week: 14.0 standard drinks    Types: 14 Cans of beer per week    Comment: at least 2 beers daily   Drug use: No     Colonoscopy:  PAP:  Bone density:  Lipid panel:  Allergies  Allergen Reactions   Codeine Itching and Nausea And Vomiting    Current Outpatient Medications  Medication Sig Dispense Refill   acetaminophen (TYLENOL) 325 MG tablet Take 650 mg by mouth every 6 (six) hours as needed.     ALPRAZolam (XANAX) 0.25 MG tablet Take 1 tablet (0.25  mg total) by mouth at bedtime. 30 tablet 2   bacitracin ointment Apply 1 application topically 2 (two) times daily. 120 g 0   diphenhydrAMINE HCl (BENADRYL ALLERGY PO) Take by mouth.     diphenhydrAMINE-zinc acetate (BENADRYL EXTRA STRENGTH) cream Apply 1 application topically 3 (three) times daily as needed for itching. 28.4 g 0   ibuprofen (ADVIL) 800 MG tablet Take 1 tablet (800 mg total) by mouth every 8 (eight) hours as needed. 30 tablet 0   labetalol (NORMODYNE)  100 MG tablet Take 1 tablet (100 mg total) by mouth 2 (two) times daily. 60 tablet 1   lidocaine-prilocaine (EMLA) cream Apply to affected area once 30 g 3   loratadine (CLARITIN) 10 MG tablet Take 10 mg by mouth daily.     ondansetron (ZOFRAN) 8 MG tablet Take 1 tablet (8 mg total) by mouth 2 (two) times daily. 60 tablet 2   prednisoLONE (ORAPRED) 15 MG/5ML solution Take by mouth.     prochlorperazine (COMPAZINE) 10 MG tablet Take 1 tablet (10 mg total) by mouth every 6 (six) hours as needed for nausea or vomiting. 60 tablet 2   No current facility-administered medications for this visit.   Facility-Administered Medications Ordered in Other Visits  Medication Dose Route Frequency Provider Last Rate Last Admin   dexamethasone (DECADRON) 10 mg in sodium chloride 0.9 % 50 mL IVPB  10 mg Intravenous Once Lloyd Huger, MD       famotidine (PEPCID) IVPB 20 mg premix  20 mg Intravenous Once Lloyd Huger, MD       heparin lock flush 100 unit/mL  500 Units Intravenous Once Lloyd Huger, MD       PACLitaxel (TAXOL) 132 mg in sodium chloride 0.9 % 250 mL chemo infusion (</= 73m/m2)  80 mg/m2 (Treatment Plan Recorded) Intravenous Once FLloyd Huger MD       sodium chloride flush (NS) 0.9 % injection 10 mL  10 mL Intravenous PRN FLloyd Huger MD   10 mL at 04/29/21 0850    OBJECTIVE: Vitals:   04/29/21 0901  BP: (!) 166/110  Pulse: 92  Resp: 16  Temp: (!) 97.2 F (36.2 C)  SpO2: 99%     Body mass index is 24.25 kg/m.    ECOG FS:0 - Asymptomatic  General: Well-developed, well-nourished, no acute distress. Eyes: Pink conjunctiva, anicteric sclera. HEENT: Normocephalic, moist mucous membranes. Lungs: No audible wheezing or coughing. Heart: Regular rate and rhythm. Abdomen: Soft, nontender, no obvious distention. Musculoskeletal: No edema, cyanosis, or clubbing. Neuro: Alert, answering all questions appropriately. Cranial nerves grossly intact. Skin: No  rashes or petechiae noted. Psych: Normal affect.  LAB RESULTS:  Lab Results  Component Value Date   NA 135 04/29/2021   K 3.3 (L) 04/29/2021   CL 104 04/29/2021   CO2 23 04/29/2021   GLUCOSE 124 (H) 04/29/2021   BUN 7 04/29/2021   CREATININE 0.78 04/29/2021   CALCIUM 9.1 04/29/2021   PROT 6.8 04/29/2021   ALBUMIN 3.9 04/29/2021   AST 20 04/29/2021   ALT 11 04/29/2021   ALKPHOS 62 04/29/2021   BILITOT 0.4 04/29/2021   GFRNONAA >60 04/29/2021   GFRAA >60 12/01/2017    Lab Results  Component Value Date   WBC 3.0 (L) 04/29/2021   NEUTROABS 1.8 04/29/2021   HGB 11.8 (L) 04/29/2021   HCT 35.7 (L) 04/29/2021   MCV 105.0 (H) 04/29/2021   PLT 226 04/29/2021     STUDIES: No results found.  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  PLAN:    1.  Bilateral breast cancer: Case discussed with pathology and this appears to be 2 distinct breast cancer primaries in her right and left breast both of which are ER/PR, HER2 negative malignancies.  CT scan completed on December 30, 2020 reviewed independently and reported as above did not reveal any metastatic disease.  MUGA scan on January 08, 2021 reported an EF of 57.3%.  Patient has also had port placement.  Previously, surgery recommended upfront systemic therapy. Given patient's age, she will also require genetic testing.  Plan to give neoadjuvant Adriamycin plus Cytoxan every 2 weeks with Neulasta support for 4 cycles followed by weekly Taxol for 12 cycles.  Patient also may benefit from adjuvant Xeloda.  Patient completed cycle 4 of Adriamycin and Cytoxan on March 05, 2021.  Proceed with cycle 7 of weekly Taxol today.  Return to clinic in 1 week for further evaluation and consideration of cycle 8. 2.  Nausea: Resolved.  Continue Compazine as needed. 3.  Anxiety/insomnia: Improved.  Continue Xanax at night as needed. 4.  Leukopenia: Mild, monitor.   5.  Anemia: Mild, monitor. 6.  Pain: Patient does not complain of this  today.  Continue ibuprofen sparingly as needed. 7.  Hypokalemia: Chronic and unchanged. 8.  Hypertension: Patient's blood pressure moderately elevated today, but she reports she forgot to take her blood pressure medications.  Proceed with treatment as above.  Patient expressed understanding and was in agreement with this plan. She also understands that She can call clinic at any time with any questions, concerns, or complaints.    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[facility]) Staging form: Breast, AJCC 8th Edition - Clinical stage from 01/12/2021: Stage IIB (cT2, cN1, cM0, G3, ER+, PR+, HER2-) - Signed by Lloyd Huger, MD on 01/12/2021 Stage prefix: Initial diagnosis Histologic grading system: 3 grade system  Invasive ductal carcinoma of right breast [facility]) Staging form: Breast, AJCC 8th Edition - Clinical stage from 01/12/2021: Stage IB (cT2, cN0, cM0, G2, ER+, PR+, HER2-) - Signed by Lloyd Huger, MD on 01/12/2021 Stage prefix: Initial diagnosis Histologic grading system: 3 grade system  Lloyd Huger, MD   04/29/2021 9:34 AM

## 2021-04-22 NOTE — Patient Instructions (Signed)
[facility] AT [place]   ?Discharge Instructions: ?Thank you for choosing [facility] to provide your oncology and hematology care.  ?If you have a lab appointment with the [facility], please go directly to the [facility] and check in at the registration area. ?  ?Wear comfortable clothing and clothing appropriate for easy access to any Portacath or PICC line.  ? ?We strive to give you quality time with your provider. You may need to reschedule your appointment if you arrive late (15 or more minutes).  Arriving late affects you and other patients whose appointments are after yours.  Also, if you miss three or more appointments without notifying the office, you may be dismissed from the clinic at the provider?s discretion.    ?  ?For prescription refill requests, have your pharmacy contact our office and allow 72 hours for refills to be completed.   ? ?Today you received the following chemotherapy and/or immunotherapy agents: Taxol.     ?  ?To help prevent nausea and vomiting after your treatment, we encourage you to take your nausea medication as directed. ? ?BELOW ARE SYMPTOMS THAT SHOULD BE REPORTED IMMEDIATELY: ?*FEVER GREATER THAN 100.4 F (38 ?C) OR HIGHER ?*CHILLS OR SWEATING ?*NAUSEA AND VOMITING THAT IS NOT CONTROLLED WITH YOUR NAUSEA MEDICATION ?*UNUSUAL SHORTNESS OF BREATH ?*UNUSUAL BRUISING OR BLEEDING ?*URINARY PROBLEMS (pain or burning when urinating, or frequent urination) ?*BOWEL PROBLEMS (unusual diarrhea, constipation, pain near the anus) ?TENDERNESS IN MOUTH AND THROAT WITH OR WITHOUT PRESENCE OF ULCERS (sore throat, sores in mouth, or a toothache) ?UNUSUAL RASH, SWELLING OR PAIN  ?UNUSUAL VAGINAL DISCHARGE OR ITCHING  ? ?Items with * indicate a potential emergency and should be followed up as soon as possible or go to the Emergency Department if any problems should occur. ? ?Please show the CHEMOTHERAPY ALERT CARD or IMMUNOTHERAPY ALERT CARD at check-in to  the Emergency Department and triage nurse. ? ?Should you have questions after your visit or need to cancel or reschedule your appointment, please contact [facility] [place]  Dept: [phone]  and follow the prompts.  Office hours are 8:00 a.m. to 4:30 p.m. Monday - Friday. Please note that voicemails left after 4:00 p.m. may not be returned until the following business day.  We are closed weekends and major holidays. You have access to a nurse at all times for urgent questions. Please call the main number to the clinic Dept: [phone] and follow the prompts. ? ?For any non-urgent questions, you may also contact your provider using MyChart. We now offer e-Visits for anyone 42 and older to request care online for non-urgent symptoms. For details visit [email or web address]. ?  ?Also download the MyChart app! Go to the app store, search "MyChart", open the app, select [facility], and log in with your MyChart username and password. ? ?Due to Covid, a mask is required upon entering the hospital/clinic. If you do not have a mask, one will be given to you upon arrival. For doctor visits, patients may have 1 support person aged 78 or older with them. For treatment visits, patients cannot have anyone with them due to current Covid guidelines and our immunocompromised population.  ?

## 2021-04-29 ENCOUNTER — Inpatient Hospital Stay: Payer: Medicaid Other

## 2021-04-29 ENCOUNTER — Other Ambulatory Visit: Payer: Self-pay

## 2021-04-29 ENCOUNTER — Inpatient Hospital Stay (HOSPITAL_BASED_OUTPATIENT_CLINIC_OR_DEPARTMENT_OTHER): Payer: Medicaid Other | Admitting: Oncology

## 2021-04-29 VITALS — BP 166/110 | HR 92 | Temp 97.2°F | Resp 16 | Ht 64.0 in | Wt 141.3 lb

## 2021-04-29 DIAGNOSIS — C50912 Malignant neoplasm of unspecified site of left female breast: Secondary | ICD-10-CM

## 2021-04-29 DIAGNOSIS — Z5111 Encounter for antineoplastic chemotherapy: Secondary | ICD-10-CM | POA: Diagnosis not present

## 2021-04-29 DIAGNOSIS — C50911 Malignant neoplasm of unspecified site of right female breast: Secondary | ICD-10-CM

## 2021-04-29 LAB — CBC WITH DIFFERENTIAL/PLATELET
Abs Immature Granulocytes: 0.01 10*3/uL (ref 0.00–0.07)
Basophils Absolute: 0 10*3/uL (ref 0.0–0.1)
Basophils Relative: 1 %
Eosinophils Absolute: 0.1 10*3/uL (ref 0.0–0.5)
Eosinophils Relative: 4 %
HCT: 35.7 % — ABNORMAL LOW (ref 36.0–46.0)
Hemoglobin: 11.8 g/dL — ABNORMAL LOW (ref 12.0–15.0)
Immature Granulocytes: 0 %
Lymphocytes Relative: 24 %
Lymphs Abs: 0.7 10*3/uL (ref 0.7–4.0)
MCH: 34.7 pg — ABNORMAL HIGH (ref 26.0–34.0)
MCHC: 33.1 g/dL (ref 30.0–36.0)
MCV: 105 fL — ABNORMAL HIGH (ref 80.0–100.0)
Monocytes Absolute: 0.4 10*3/uL (ref 0.1–1.0)
Monocytes Relative: 12 %
Neutro Abs: 1.8 10*3/uL (ref 1.7–7.7)
Neutrophils Relative %: 59 %
Platelets: 226 10*3/uL (ref 150–400)
RBC: 3.4 MIL/uL — ABNORMAL LOW (ref 3.87–5.11)
RDW: 15.2 % (ref 11.5–15.5)
WBC: 3 10*3/uL — ABNORMAL LOW (ref 4.0–10.5)
nRBC: 0 % (ref 0.0–0.2)

## 2021-04-29 LAB — COMPREHENSIVE METABOLIC PANEL
ALT: 11 U/L (ref 0–44)
AST: 20 U/L (ref 15–41)
Albumin: 3.9 g/dL (ref 3.5–5.0)
Alkaline Phosphatase: 62 U/L (ref 38–126)
Anion gap: 8 (ref 5–15)
BUN: 7 mg/dL (ref 6–20)
CO2: 23 mmol/L (ref 22–32)
Calcium: 9.1 mg/dL (ref 8.9–10.3)
Chloride: 104 mmol/L (ref 98–111)
Creatinine, Ser: 0.78 mg/dL (ref 0.44–1.00)
GFR, Estimated: >60 mL/min (ref >60)
Glucose, Bld: 124 mg/dL — ABNORMAL HIGH (ref 70–99)
Potassium: 3.3 mmol/L — ABNORMAL LOW (ref 3.5–5.1)
Sodium: 135 mmol/L (ref 135–145)
Total Bilirubin: 0.4 mg/dL (ref 0.3–1.2)
Total Protein: 6.8 g/dL (ref 6.5–8.1)

## 2021-04-29 MED ORDER — SODIUM CHLORIDE 0.9% FLUSH
10.0000 mL | INTRAVENOUS | Status: DC | PRN
  Administered 2021-04-29: 09:00:00 10 mL via INTRAVENOUS
  Filled 2021-04-29: qty 10

## 2021-04-29 MED ORDER — HEPARIN SOD (PORK) LOCK FLUSH 100 UNIT/ML IV SOLN
500.0000 [IU] | Freq: Once | INTRAVENOUS | Status: AC
  Administered 2021-04-29: 12:00:00 500 [IU] via INTRAVENOUS
  Filled 2021-04-29: qty 5

## 2021-04-29 MED ORDER — SODIUM CHLORIDE 0.9 % IV SOLN
10.0000 mg | Freq: Once | INTRAVENOUS | Status: AC
  Administered 2021-04-29: 10:00:00 10 mg via INTRAVENOUS
  Filled 2021-04-29: qty 10

## 2021-04-29 MED ORDER — SODIUM CHLORIDE 0.9 % IV SOLN
Freq: Once | INTRAVENOUS | Status: AC
  Filled 2021-04-29: qty 250

## 2021-04-29 MED ORDER — FAMOTIDINE IN NACL 20-0.9 MG/50ML-% IV SOLN
20.0000 mg | Freq: Once | INTRAVENOUS | Status: AC
  Administered 2021-04-29: 10:00:00 20 mg via INTRAVENOUS
  Filled 2021-04-29: qty 50

## 2021-04-29 MED ORDER — SODIUM CHLORIDE 0.9 % IV SOLN
80.0000 mg/m2 | Freq: Once | INTRAVENOUS | Status: AC
  Administered 2021-04-29: 11:00:00 132 mg via INTRAVENOUS
  Filled 2021-04-29: qty 22

## 2021-04-29 MED ORDER — DIPHENHYDRAMINE HCL 50 MG/ML IJ SOLN
25.0000 mg | Freq: Once | INTRAMUSCULAR | Status: AC
  Administered 2021-04-29: 10:00:00 25 mg via INTRAVENOUS
  Filled 2021-04-29: qty 1

## 2021-04-29 NOTE — Patient Instructions (Signed)
[facility] CTR AT [place]  Discharge Instructions: Thank you for choosing [facility] to provide your oncology and hematology care.  If you have a lab appointment with the [facility], please go directly to the [facility] and check in at the registration area.  Wear comfortable clothing and clothing appropriate for easy access to any Portacath or PICC line.   We strive to give you quality time with your provider. You may need to reschedule your appointment if you arrive late (15 or more minutes).  Arriving late affects you and other patients whose appointments are after yours.  Also, if you miss three or more appointments without notifying the office, you may be dismissed from the clinic at the providers discretion.      For prescription refill requests, have your pharmacy contact our office and allow 72 hours for refills to be completed.    Today you received the following chemotherapy and/or immunotherapy agents: taxol      To help prevent nausea and vomiting after your treatment, we encourage you to take your nausea medication as directed.  BELOW ARE SYMPTOMS THAT SHOULD BE REPORTED IMMEDIATELY: *FEVER GREATER THAN 100.4 F (38 C) OR HIGHER *CHILLS OR SWEATING *NAUSEA AND VOMITING THAT IS NOT CONTROLLED WITH YOUR NAUSEA MEDICATION *UNUSUAL SHORTNESS OF BREATH *UNUSUAL BRUISING OR BLEEDING *URINARY PROBLEMS (pain or burning when urinating, or frequent urination) *BOWEL PROBLEMS (unusual diarrhea, constipation, pain near the anus) TENDERNESS IN MOUTH AND THROAT WITH OR WITHOUT PRESENCE OF ULCERS (sore throat, sores in mouth, or a toothache) UNUSUAL RASH, SWELLING OR PAIN  UNUSUAL VAGINAL DISCHARGE OR ITCHING   Items with * indicate a potential emergency and should be followed up as soon as possible or go to the Emergency Department if any problems should occur.  Please show the CHEMOTHERAPY ALERT CARD or IMMUNOTHERAPY ALERT CARD at check-in to the  Emergency Department and triage nurse.  Should you have questions after your visit or need to cancel or reschedule your appointment, please contact [facility] CANCER [place] AT [place]  [phone] and follow the prompts.  Office hours are 8:00 a.m. to 4:30 p.m. Monday - Friday. Please note that voicemails left after 4:00 p.m. may not be returned until the following business day.  We are closed weekends and major holidays. You have access to a nurse at all times for urgent questions. Please call the main number to the clinic [phone] and follow the prompts.  For any non-urgent questions, you may also contact your provider using MyChart. We now offer e-Visits for anyone 70 and older to request care online for non-urgent symptoms. For details visit [email or web address].   Also download the MyChart app! Go to the app store, search "MyChart", open the app, select [facility], and log in with your MyChart username and password.  Due to Covid, a mask is required upon entering the hospital/clinic. If you do not have a mask, one will be given to you upon arrival. For doctor visits, patients may have 1 support person aged 21 or older with them. For treatment visits, patients cannot have anyone with them due to current Covid guidelines and our immunocompromised population. Paclitaxel injection What is this medication? PACLITAXEL (PAK li TAX el) is a chemotherapy drug. It targets fast dividing cells, like cancer cells, and causes these cells to die. This medicine is used to treat ovarian cancer, breast cancer, lung cancer, Kaposi's sarcoma, and other cancers. This medicine may be used for other purposes; ask your health  care provider or pharmacist if you have questions. COMMON BRAND NAME(S): Onxol, Taxol What should I tell my care team before I take this medication? They need to know if you have any of these conditions: history of irregular heartbeat liver disease low blood counts, like low  white cell, platelet, or red cell counts lung or breathing disease, like asthma tingling of the fingers or toes, or other nerve disorder an unusual or allergic reaction to paclitaxel, alcohol, polyoxyethylated castor oil, other chemotherapy, other medicines, foods, dyes, or preservatives pregnant or trying to get pregnant breast-feeding How should I use this medication? This drug is given as an infusion into a vein. It is administered in a hospital or clinic by a specially trained health care professional. Talk to your pediatrician regarding the use of this medicine in children. Special care may be needed. Overdosage: If you think you have taken too much of this medicine contact a poison control center or emergency room at once. NOTE: This medicine is only for you. Do not share this medicine with others. What if I miss a dose? It is important not to miss your dose. Call your doctor or health care professional if you are unable to keep an appointment. What may interact with this medication? Do not take this medicine with any of the following medications: live virus vaccines This medicine may also interact with the following medications: antiviral medicines for hepatitis, HIV or AIDS certain antibiotics like erythromycin and clarithromycin certain medicines for fungal infections like ketoconazole and itraconazole certain medicines for seizures like carbamazepine, phenobarbital, phenytoin gemfibrozil nefazodone rifampin St. John's wort This list may not describe all possible interactions. Give your health care provider a list of all the medicines, herbs, non-prescription drugs, or dietary supplements you use. Also tell them if you smoke, drink alcohol, or use illegal drugs. Some items may interact with your medicine. What should I watch for while using this medication? Your condition will be monitored carefully while you are receiving this medicine. You will need important blood work done  while you are taking this medicine. This medicine can cause serious allergic reactions. To reduce your risk you will need to take other medicine(s) before treatment with this medicine. If you experience allergic reactions like skin rash, itching or hives, swelling of the face, lips, or tongue, tell your doctor or health care professional right away. In some cases, you may be given additional medicines to help with side effects. Follow all directions for their use. This drug may make you feel generally unwell. This is not uncommon, as chemotherapy can affect healthy cells as well as cancer cells. Report any side effects. Continue your course of treatment even though you feel ill unless your doctor tells you to stop. Call your doctor or health care professional for advice if you get a fever, chills or sore throat, or other symptoms of a cold or flu. Do not treat yourself. This drug decreases your body's ability to fight infections. Try to avoid being around people who are sick. This medicine may increase your risk to bruise or bleed. Call your doctor or health care professional if you notice any unusual bleeding. Be careful brushing and flossing your teeth or using a toothpick because you may get an infection or bleed more easily. If you have any dental work done, tell your dentist you are receiving this medicine. Avoid taking products that contain aspirin, acetaminophen, ibuprofen, naproxen, or ketoprofen unless instructed by your doctor. These medicines may hide a fever. Do  not become pregnant while taking this medicine. Women should inform their doctor if they wish to become pregnant or think they might be pregnant. There is a potential for serious side effects to an unborn child. Talk to your health care professional or pharmacist for more information. Do not breast-feed an infant while taking this medicine. Men are advised not to father a child while receiving this medicine. This product may contain  alcohol. Ask your pharmacist or healthcare provider if this medicine contains alcohol. Be sure to tell all healthcare providers you are taking this medicine. Certain medicines, like metronidazole and disulfiram, can cause an unpleasant reaction when taken with alcohol. The reaction includes flushing, headache, nausea, vomiting, sweating, and increased thirst. The reaction can last from 30 minutes to several hours. What side effects may I notice from receiving this medication? Side effects that you should report to your doctor or health care professional as soon as possible: allergic reactions like skin rash, itching or hives, swelling of the face, lips, or tongue breathing problems changes in vision fast, irregular heartbeat high or low blood pressure mouth sores pain, tingling, numbness in the hands or feet signs of decreased platelets or bleeding - bruising, pinpoint red spots on the skin, black, tarry stools, blood in the urine signs of decreased red blood cells - unusually weak or tired, feeling faint or lightheaded, falls signs of infection - fever or chills, cough, sore throat, pain or difficulty passing urine signs and symptoms of liver injury like dark yellow or brown urine; general ill feeling or flu-like symptoms; light-colored stools; loss of appetite; nausea; right upper belly pain; unusually weak or tired; yellowing of the eyes or skin swelling of the ankles, feet, hands unusually slow heartbeat Side effects that usually do not require medical attention (report to your doctor or health care professional if they continue or are bothersome): diarrhea hair loss loss of appetite muscle or joint pain nausea, vomiting pain, redness, or irritation at site where injected tiredness This list may not describe all possible side effects. Call your doctor for medical advice about side effects. You may report side effects to FDA at 1-800-FDA-1088. Where should I keep my medication? This drug  is given in a hospital or clinic and will not be stored at home. NOTE: This sheet is a summary. It may not cover all possible information. If you have questions about this medicine, talk to your doctor, pharmacist, or health care provider.  2022 Elsevier/Gold Standard (2020-10-27 00:00:00)

## 2021-04-29 NOTE — Progress Notes (Signed)
Pt has no concerns at this time. BP 166/110 but says she did not take her bp medication this morning. ?

## 2021-04-29 NOTE — Progress Notes (Signed)
BP 166/110 today. Pt states she did not take her BP meds this am. Dr Grayland Ormond aware. Per MD proceed with Taxol treatment today ?

## 2021-04-30 NOTE — Progress Notes (Unsigned)
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  INTERVAL HISTORY: Patient returns to clinic today for further evaluation and consideration of cycle 7 of weekly Taxol.  Since reinitiating Xanax at night, she is sleeping better and is no longer having nightmares.  She continues to have fingernail tenderness, but denies peripheral neuropathy.  She otherwise feels well.  She has no neurologic complaints.  She denies any recent fevers or illnesses.  She has a good appetite and denies weight loss.  She has no chest pain, shortness of breath, cough, or hemoptysis.  She denies any nausea, vomiting, constipation, or diarrhea.  She has no urinary complaints.  Patient offers no further specific complaints today.  REVIEW OF SYSTEMS:   Review of Systems  Constitutional: Negative.  Negative for chills, fever and malaise/fatigue.  Respiratory: Negative.  Negative for cough, hemoptysis and shortness of breath.   Cardiovascular: Negative.  Negative for chest pain and leg swelling.  Gastrointestinal: Negative.  Negative for abdominal pain and nausea.  Genitourinary: Negative.  Negative for dysuria.  Musculoskeletal: Negative.  Negative for back pain.  Skin: Negative.  Negative for rash.  Neurological: Negative.  Negative for dizziness, focal weakness, weakness and headaches.  Psychiatric/Behavioral: Negative.  The patient is not nervous/anxious and does not have insomnia.    As per HPI. Otherwise, a complete review of systems is negative.  PAST MEDICAL HISTORY: Past Medical History:  Diagnosis Date   Anxiety    Headache    Hypertension    Noncompliance     PAST SURGICAL  HISTORY: Past Surgical History:  Procedure Laterality Date   BREAST BIOPSY Right 12/24/2020   mass, 9:00 8 cmfn venus marker, path pending   BREAST BIOPSY Left 12/24/2020   mass 3:00 retroareolar, ribbon, path pending   BREAST BIOPSY Left 12/24/2020   mass 3:00 5cmfn, heart marker, path pending   BREAST BIOPSY Left 12/24/2020   axilla LN, hyrdomarker shape 3 coil, path pending   PORTACATH PLACEMENT Right 01/01/2021   Procedure: INSERTION PORT-A-CATH;  Surgeon: Ronny Bacon, MD;  Location: [facility] ORS;  Service: General;  Laterality: Right;   TUBAL LIGATION      FAMILY HISTORY: Family History  Problem Relation Age of Onset   COPD Mother    Hypertension Mother    Heart disease Father     ADVANCED DIRECTIVES (Y/N):  N  HEALTH MAINTENANCE: Social History   Tobacco Use   Smoking status: Every Day    Packs/day: 1.50    Types: Cigarettes   Smokeless tobacco: Never  Vaping Use   Vaping Use: Never used  Substance Use Topics   Alcohol use: Yes    Alcohol/week: 14.0 standard drinks    Types: 14 Cans of beer per week    Comment: at least 2 beers daily   Drug use: No     Colonoscopy:  PAP:  Bone density:  Lipid panel:  Allergies  Allergen Reactions   Codeine Itching and Nausea And Vomiting    Current Outpatient Medications  Medication Sig Dispense Refill   acetaminophen (TYLENOL) 325 MG tablet Take 650 mg by mouth every 6 (six) hours as needed.     ALPRAZolam (XANAX) 0.25 MG tablet Take 1 tablet (0.25  mg total) by mouth at bedtime. 30 tablet 2   bacitracin ointment Apply 1 application topically 2 (two) times daily. 120 g 0   diphenhydrAMINE HCl (BENADRYL ALLERGY PO) Take by mouth.     diphenhydrAMINE-zinc acetate (BENADRYL EXTRA STRENGTH) cream Apply 1 application topically 3 (three) times daily as needed for itching. 28.4 g 0   ibuprofen (ADVIL) 800 MG tablet Take 1 tablet (800 mg total) by mouth every 8 (eight) hours as needed. 30 tablet 0   labetalol (NORMODYNE)  100 MG tablet Take 1 tablet (100 mg total) by mouth 2 (two) times daily. 60 tablet 1   lidocaine-prilocaine (EMLA) cream Apply to affected area once 30 g 3   loratadine (CLARITIN) 10 MG tablet Take 10 mg by mouth daily.     ondansetron (ZOFRAN) 8 MG tablet Take 1 tablet (8 mg total) by mouth 2 (two) times daily. 60 tablet 2   prednisoLONE (ORAPRED) 15 MG/5ML solution Take by mouth.     prochlorperazine (COMPAZINE) 10 MG tablet Take 1 tablet (10 mg total) by mouth every 6 (six) hours as needed for nausea or vomiting. 60 tablet 2   No current facility-administered medications for this visit.    OBJECTIVE: There were no vitals filed for this visit.    There is no height or weight on file to calculate BMI.    ECOG FS:0 - Asymptomatic  General: Well-developed, well-nourished, no acute distress. Eyes: Pink conjunctiva, anicteric sclera. HEENT: Normocephalic, moist mucous membranes. Lungs: No audible wheezing or coughing. Heart: Regular rate and rhythm. Abdomen: Soft, nontender, no obvious distention. Musculoskeletal: No edema, cyanosis, or clubbing. Neuro: Alert, answering all questions appropriately. Cranial nerves grossly intact. Skin: No rashes or petechiae noted. Psych: Normal affect.  LAB RESULTS:  Lab Results  Component Value Date   NA 135 04/29/2021   K 3.3 (L) 04/29/2021   CL 104 04/29/2021   CO2 23 04/29/2021   GLUCOSE 124 (H) 04/29/2021   BUN 7 04/29/2021   CREATININE 0.78 04/29/2021   CALCIUM 9.1 04/29/2021   PROT 6.8 04/29/2021   ALBUMIN 3.9 04/29/2021   AST 20 04/29/2021   ALT 11 04/29/2021   ALKPHOS 62 04/29/2021   BILITOT 0.4 04/29/2021   GFRNONAA >60 04/29/2021   GFRAA >60 12/01/2017    Lab Results  Component Value Date   WBC 3.0 (L) 04/29/2021   NEUTROABS 1.8 04/29/2021   HGB 11.8 (L) 04/29/2021   HCT 35.7 (L) 04/29/2021   MCV 105.0 (H) 04/29/2021   PLT 226 04/29/2021     STUDIES: No results found.  ASSESSMENT: Bilateral triple negative breast  cancer.  PLAN:    1.  Bilateral breast cancer: Case discussed with pathology and this appears to be 2 distinct breast cancer primaries in her right and left breast both of which are ER/PR, HER2 negative malignancies.  CT scan completed on December 30, 2020 reviewed independently and reported as above did not reveal any metastatic disease.  MUGA scan on January 08, 2021 reported an EF of 57.3%.  Patient has also had port placement.  Previously, surgery recommended upfront systemic therapy. Given patient's age, she will also require genetic testing.  Plan to give neoadjuvant Adriamycin plus Cytoxan every 2 weeks with Neulasta support for 4 cycles followed by weekly Taxol for 12 cycles.  Patient also may benefit from adjuvant Xeloda.  Patient completed cycle 4 of Adriamycin and Cytoxan on March 05, 2021.  Proceed with cycle 7 of weekly Taxol today.  Return to clinic in  1 week for further evaluation and consideration of cycle 8. 2.  Nausea: Resolved.  Continue Compazine as needed. 3.  Anxiety/insomnia: Improved.  Continue Xanax at night as needed. 4.  Leukopenia: Mild, monitor.   5.  Anemia: Mild, monitor. 6.  Pain: Patient does not complain of this today.  Continue ibuprofen sparingly as needed. 7.  Hypokalemia: Chronic and unchanged. 8.  Hypertension: Patient's blood pressure moderately elevated today, but she reports she forgot to take her blood pressure medications.  Proceed with treatment as above.  Patient expressed understanding and was in agreement with this plan. She also understands that She can call clinic at any time with any questions, concerns, or complaints.    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HCC) Staging form: Breast, AJCC 8th Edition - Clinical stage from 01/12/2021: Stage IIB (cT2, cN1, cM0, G3, ER+, PR+, HER2-) - Signed by Lloyd Huger, MD on 01/12/2021 Stage prefix: Initial diagnosis Histologic grading system: 3 grade system  Invasive ductal  carcinoma of right breast [facility]) Staging form: Breast, AJCC 8th Edition - Clinical stage from 01/12/2021: Stage IB (cT2, cN0, cM0, G2, ER+, PR+, HER2-) - Signed by Lloyd Huger, MD on 01/12/2021 Stage prefix: Initial diagnosis Histologic grading system: 3 grade system  Lloyd Huger, MD   04/30/2021 9:26 AM

## 2021-05-05 MED FILL — Dexamethasone Sodium Phosphate Inj 100 MG/10ML: INTRAMUSCULAR | Qty: 1 | Status: AC

## 2021-05-06 ENCOUNTER — Inpatient Hospital Stay: Payer: Medicaid Other

## 2021-05-06 ENCOUNTER — Encounter: Payer: Self-pay | Admitting: Oncology

## 2021-05-06 ENCOUNTER — Inpatient Hospital Stay (HOSPITAL_BASED_OUTPATIENT_CLINIC_OR_DEPARTMENT_OTHER): Payer: Medicaid Other | Admitting: Oncology

## 2021-05-06 ENCOUNTER — Other Ambulatory Visit: Payer: Self-pay

## 2021-05-06 VITALS — BP 136/107 | HR 97 | Wt 145.0 lb

## 2021-05-06 DIAGNOSIS — Z5111 Encounter for antineoplastic chemotherapy: Secondary | ICD-10-CM | POA: Diagnosis not present

## 2021-05-06 DIAGNOSIS — C50912 Malignant neoplasm of unspecified site of left female breast: Secondary | ICD-10-CM

## 2021-05-06 LAB — CBC WITH DIFFERENTIAL/PLATELET
Abs Immature Granulocytes: 0.01 10*3/uL (ref 0.00–0.07)
Basophils Absolute: 0 10*3/uL (ref 0.0–0.1)
Basophils Relative: 1 %
Eosinophils Absolute: 0.1 10*3/uL (ref 0.0–0.5)
Eosinophils Relative: 2 %
HCT: 38.3 % (ref 36.0–46.0)
Hemoglobin: 12.7 g/dL (ref 12.0–15.0)
Immature Granulocytes: 0 %
Lymphocytes Relative: 23 %
Lymphs Abs: 1 10*3/uL (ref 0.7–4.0)
MCH: 35.1 pg — ABNORMAL HIGH (ref 26.0–34.0)
MCHC: 33.2 g/dL (ref 30.0–36.0)
MCV: 105.8 fL — ABNORMAL HIGH (ref 80.0–100.0)
Monocytes Absolute: 0.5 10*3/uL (ref 0.1–1.0)
Monocytes Relative: 11 %
Neutro Abs: 2.7 10*3/uL (ref 1.7–7.7)
Neutrophils Relative %: 63 %
Platelets: 239 10*3/uL (ref 150–400)
RBC: 3.62 MIL/uL — ABNORMAL LOW (ref 3.87–5.11)
RDW: 14.5 % (ref 11.5–15.5)
WBC: 4.3 10*3/uL (ref 4.0–10.5)
nRBC: 0 % (ref 0.0–0.2)

## 2021-05-06 LAB — COMPREHENSIVE METABOLIC PANEL
ALT: 13 U/L (ref 0–44)
AST: 22 U/L (ref 15–41)
Albumin: 3.9 g/dL (ref 3.5–5.0)
Alkaline Phosphatase: 61 U/L (ref 38–126)
Anion gap: 9 (ref 5–15)
BUN: 9 mg/dL (ref 6–20)
CO2: 22 mmol/L (ref 22–32)
Calcium: 9.2 mg/dL (ref 8.9–10.3)
Chloride: 103 mmol/L (ref 98–111)
Creatinine, Ser: 0.83 mg/dL (ref 0.44–1.00)
GFR, Estimated: >60 mL/min (ref >60)
Glucose, Bld: 116 mg/dL — ABNORMAL HIGH (ref 70–99)
Potassium: 3.6 mmol/L (ref 3.5–5.1)
Sodium: 134 mmol/L — ABNORMAL LOW (ref 135–145)
Total Bilirubin: 0.4 mg/dL (ref 0.3–1.2)
Total Protein: 7 g/dL (ref 6.5–8.1)

## 2021-05-06 MED ORDER — HEPARIN SOD (PORK) LOCK FLUSH 100 UNIT/ML IV SOLN
INTRAVENOUS | Status: AC
  Administered 2021-05-06: 500 [IU]
  Filled 2021-05-06: qty 5

## 2021-05-06 MED ORDER — FAMOTIDINE IN NACL 20-0.9 MG/50ML-% IV SOLN
20.0000 mg | Freq: Once | INTRAVENOUS | Status: AC
  Administered 2021-05-06: 20 mg via INTRAVENOUS
  Filled 2021-05-06: qty 50

## 2021-05-06 MED ORDER — SODIUM CHLORIDE 0.9 % IV SOLN
10.0000 mg | Freq: Once | INTRAVENOUS | Status: AC
  Administered 2021-05-06: 10 mg via INTRAVENOUS
  Filled 2021-05-06: qty 10

## 2021-05-06 MED ORDER — HEPARIN SOD (PORK) LOCK FLUSH 100 UNIT/ML IV SOLN
500.0000 [IU] | Freq: Once | INTRAVENOUS | Status: AC | PRN
  Filled 2021-05-06: qty 5

## 2021-05-06 MED ORDER — SODIUM CHLORIDE 0.9 % IV SOLN
80.0000 mg/m2 | Freq: Once | INTRAVENOUS | Status: AC
  Administered 2021-05-06: 132 mg via INTRAVENOUS
  Filled 2021-05-06: qty 22

## 2021-05-06 MED ORDER — SODIUM CHLORIDE 0.9 % IV SOLN
Freq: Once | INTRAVENOUS | Status: AC
  Filled 2021-05-06: qty 250

## 2021-05-06 MED ORDER — DIPHENHYDRAMINE HCL 50 MG/ML IJ SOLN
25.0000 mg | Freq: Once | INTRAMUSCULAR | Status: AC
  Administered 2021-05-06: 25 mg via INTRAVENOUS
  Filled 2021-05-06: qty 1

## 2021-05-06 NOTE — Progress Notes (Signed)
Per Gust Rung RN per Dr. Grayland Ormond okay to proceed with Taxol with B/P 136/107. ?

## 2021-05-06 NOTE — Patient Instructions (Signed)
[facility] [place]  Discharge Instructions: Thank you for choosing [facility] to provide your oncology and hematology care.  If you have a lab appointment with the Cancer Center, please go directly to the Cancer Center and check in at the registration area.  Wear comfortable clothing and clothing appropriate for easy access to any Portacath or PICC line.   We strive to give you quality time with your provider. You may need to reschedule your appointment if you arrive late (15 or more minutes).  Arriving late affects you and other patients whose appointments are after yours.  Also, if you miss three or more appointments without notifying the office, you may be dismissed from the clinic at the provider's discretion.      For prescription refill requests, have your pharmacy contact our office and allow 72 hours for refills to be completed.    Today you received the following chemotherapy and/or immunotherapy agents Taxol      To help prevent nausea and vomiting after your treatment, we encourage you to take your nausea medication as directed.  BELOW ARE SYMPTOMS THAT SHOULD BE REPORTED IMMEDIATELY: *FEVER GREATER THAN 100.4 F (38 C) OR HIGHER *CHILLS OR SWEATING *NAUSEA AND VOMITING THAT IS NOT CONTROLLED WITH YOUR NAUSEA MEDICATION *UNUSUAL SHORTNESS OF BREATH *UNUSUAL BRUISING OR BLEEDING *URINARY PROBLEMS (pain or burning when urinating, or frequent urination) *BOWEL PROBLEMS (unusual diarrhea, constipation, pain near the anus) TENDERNESS IN MOUTH AND THROAT WITH OR WITHOUT PRESENCE OF ULCERS (sore throat, sores in mouth, or a toothache) UNUSUAL RASH, SWELLING OR PAIN  UNUSUAL VAGINAL DISCHARGE OR ITCHING   Items with * indicate a potential emergency and should be followed up as soon as possible or go to the Emergency Department if any problems should occur.  Please show the CHEMOTHERAPY ALERT CARD or IMMUNOTHERAPY ALERT CARD at check-in to the  Emergency Department and triage nurse.  Should you have questions after your visit or need to cancel or reschedule your appointment, please contact [facility]  [phone] and follow the prompts.  Office hours are 8:00 a.m. to 4:30 p.m. Monday - Friday. Please note that voicemails left after 4:00 p.m. may not be returned until the following business day.  We are closed weekends and major holidays. You have access to a nurse at all times for urgent questions. Please call the main number to the clinic [phone] and follow the prompts.  For any non-urgent questions, you may also contact your provider using MyChart. We now offer e-Visits for anyone 18 and older to request care online for non-urgent symptoms. For details visit [email or web address].   Also download the MyChart app! Go to the app store, search "MyChart", open the app, select [facility] , and log in with your MyChart username and password.  Due to Covid, a mask is required upon entering the hospital/clinic. If you do not have a mask, one will be given to you upon arrival. For doctor visits, patients may have 1 support person aged 18 or older with them. For treatment visits, patients cannot have anyone with them due to current Covid guidelines and our immunocompromised population.  

## 2021-05-09 NOTE — Progress Notes (Signed)
[place] today ?[place]  ?[phone] [phone] ? ?ID: Briana Soto OB: 07/06/1975  MR#: [id number]  LKG#:[id number] ? ?Patient Care Team: ?Patient, [name] as PCP - General (General Practice) ?Rico Junker, RN as Registered Nurse ?Theodore Demark, RN as Registered Nurse ? 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 ? ?INTERVAL HISTORY: Patient returns to clinic today for further evaluation and consideration of cycle 9 of weekly Taxol.  She continues to tolerate her treatments well without significant side effects.  She continues to have some mild tenderness of her finger nails, but otherwise feels well.  She denies any peripheral neuropathy.  She has no neurologic complaints.  She denies any recent fevers or illnesses.  She has a good appetite and denies weight loss.  She has no chest pain, shortness of breath, cough, or hemoptysis.  She denies any nausea, vomiting, constipation, or diarrhea.  She has no urinary complaints.  Patient offers no further specific complaints today. ? ?REVIEW OF SYSTEMS:   ?Review of Systems  ?Constitutional: Negative.  Negative for chills, fever and malaise/fatigue.  ?Respiratory: Negative.  Negative for cough, hemoptysis and shortness of breath.   ?Cardiovascular: Negative.  Negative for chest pain and leg swelling.  ?Gastrointestinal: Negative.  Negative for abdominal pain and nausea.  ?Genitourinary: Negative.  Negative for dysuria.  ?Musculoskeletal: Negative.  Negative for back pain.  ?Skin: Negative.  Negative for rash.  ?Neurological: Negative.  Negative for dizziness, focal weakness, weakness and headaches.  ?Psychiatric/Behavioral: Negative.  The patient is not nervous/anxious and does not have insomnia.   ? ?As per HPI. Otherwise, a complete review of systems is negative. ? ?PAST MEDICAL HISTORY: ?Past Medical History:  ?Diagnosis Date  ? Anxiety   ? Headache   ? Hypertension   ? Noncompliance    ? ? ?PAST SURGICAL HISTORY: ?Past Surgical History:  ?Procedure Laterality Date  ? BREAST BIOPSY Right 12/24/2020  ? mass, 9:00 8 cmfn venus marker, path pending  ? BREAST BIOPSY Left 12/24/2020  ? mass 3:00 retroareolar, ribbon, path pending  ? BREAST BIOPSY Left 12/24/2020  ? mass 3:00 5cmfn, heart marker, path pending  ? BREAST BIOPSY Left 12/24/2020  ? axilla LN, hyrdomarker shape 3 coil, path pending  ? PORTACATH PLACEMENT Right 01/01/2021  ? Procedure: INSERTION PORT-A-CATH;  Surgeon: Ronny Bacon, MD;  Location: [facility] ORS;  Service: General;  Laterality: Right;  ? TUBAL LIGATION    ? ? ?FAMILY HISTORY: ?Family History  ?Problem Relation Age of Onset  ? COPD Mother   ? Hypertension Mother   ? Heart disease Father   ? ? ?ADVANCED DIRECTIVES (Y/N):  N ? ?HEALTH MAINTENANCE: ?Social History  ? ?Tobacco Use  ? Smoking status: Every Day  ?  Packs/day: 1.50  ?  Types: Cigarettes  ? Smokeless tobacco: Never  ?Vaping Use  ? Vaping Use: Never used  ?Substance Use Topics  ? Alcohol use: Yes  ?  Alcohol/week: 14.0 standard drinks  ?  Types: 14 Cans of beer per week  ?  Comment: at least 2 beers daily  ? Drug use: No  ? ? ? Colonoscopy: ? PAP: ? Bone density: ? Lipid panel: ? ?Allergies  ?Allergen Reactions  ? Codeine Itching and Nausea And Vomiting  ? ? ?Current Outpatient Medications  ?Medication Sig Dispense Refill  ? acetaminophen (TYLENOL) 325 MG tablet Take 650 mg by mouth every 6 (six) hours as needed.    ? ALPRAZolam (XANAX) 0.25 MG tablet  Take 1 tablet (0.25 mg total) by mouth at bedtime. 30 tablet 2  ? bacitracin ointment Apply 1 application topically 2 (two) times daily. 120 g 0  ? diphenhydrAMINE HCl (BENADRYL ALLERGY PO) Take by mouth.    ? diphenhydrAMINE-zinc acetate (BENADRYL EXTRA STRENGTH) cream Apply 1 application topically 3 (three) times daily as needed for itching. 28.4 g 0  ? ibuprofen (ADVIL) 800 MG tablet Take 1 tablet (800 mg total) by mouth every 8 (eight) hours as needed. 30 tablet 0  ?  labetalol (NORMODYNE) 100 MG tablet Take 1 tablet (100 mg total) by mouth 2 (two) times daily. 60 tablet 1  ? lidocaine-prilocaine (EMLA) cream Apply to affected area once 30 g 3  ? loratadine (CLARITIN) 10 MG tablet Take 10 mg by mouth daily.    ? ondansetron (ZOFRAN) 8 MG tablet Take 1 tablet (8 mg total) by mouth 2 (two) times daily. (Patient not taking: Reported on 05/13/2021) 60 tablet 2  ? prednisoLONE (ORAPRED) 15 MG/5ML solution Take by mouth. (Patient not taking: Reported on 05/06/2021)    ? prochlorperazine (COMPAZINE) 10 MG tablet Take 1 tablet (10 mg total) by mouth every 6 (six) hours as needed for nausea or vomiting. (Patient not taking: Reported on 05/13/2021) 60 tablet 2  ? ?No current facility-administered medications for this visit.  ? ? ?OBJECTIVE: ?Vitals:  ? 05/13/21 0904  ?BP: (!) 132/98  ?Pulse: 99  ?Resp: 16  ?Temp: (!) 97.2 ?F (36.2 ?C)  ?SpO2: 98%  ?   Body mass index is 25.03 kg/m?Marland Kitchen    ECOG FS:0 - Asymptomatic ? ?General: Well-developed, well-nourished, no acute distress. ?Eyes: Pink conjunctiva, anicteric sclera. ?HEENT: Normocephalic, moist mucous membranes. ?Lungs: No audible wheezing or coughing. ?Heart: Regular rate and rhythm. ?Abdomen: Soft, nontender, no obvious distention. ?Musculoskeletal: No edema, cyanosis, or clubbing. ?Neuro: Alert, answering all questions appropriately. Cranial nerves grossly intact. ?Skin: No rashes or petechiae noted. ?Psych: Normal affect. ? ?LAB RESULTS: ? ?Lab Results  ?Component Value Date  ? NA 134 (L) 05/13/2021  ? K 3.4 (L) 05/13/2021  ? CL 103 05/13/2021  ? CO2 22 05/13/2021  ? GLUCOSE 112 (H) 05/13/2021  ? BUN 10 05/13/2021  ? CREATININE 0.81 05/13/2021  ? CALCIUM 8.9 05/13/2021  ? PROT 7.0 05/13/2021  ? ALBUMIN 3.8 05/13/2021  ? AST 20 05/13/2021  ? ALT 12 05/13/2021  ? ALKPHOS 65 05/13/2021  ? BILITOT 0.3 05/13/2021  ? GFRNONAA >60 05/13/2021  ? GFRAA >60 12/01/2017  ? ? ?Lab Results  ?Component Value Date  ? WBC 3.5 (L) 05/13/2021  ? NEUTROABS 1.9  05/13/2021  ? HGB 12.6 05/13/2021  ? HCT 38.2 05/13/2021  ? MCV 105.2 (H) 05/13/2021  ? PLT 239 05/13/2021  ? ? ? ?STUDIES: ?No results found. ? 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 ? ?PLAN:   ? ?1.  Bilateral breast cancer: Case discussed with pathology and this appears to be 2 distinct breast cancer primaries in her right and left breast both of which are ER/PR, HER-2 negative malignancies.  CT scan completed on December 30, 2020 reviewed independently and reported as above did not reveal any metastatic disease.  MUGA scan on January 08, 2021 reported an EF of 57.3%.  Patient has also had port placement.  Previously, surgery recommended upfront systemic therapy. Given patient's age, she will also require genetic testing.  Plan to give neoadjuvant Adriamycin plus Cytoxan every 2 weeks with Neulasta support for 4 cycles followed by weekly Taxol for 12 cycles.  Patient also may benefit from adjuvant Xeloda.  Patient completed cycle 4 of Adriamycin and Cytoxan on March 05, 2021.  Proceed with cycle 9 of weekly Taxol today.  Return to clinic in 1 week for further evaluation and consideration of cycle 10.   ?2.  Nausea: Resolved.  Continue Compazine as needed. ?3.  Anxiety/insomnia: Resolved.  Continue Xanax at night as needed. ?4.  Leukopenia: Mild, monitor. ?5.  Anemia: Resolved. ?6.  Pain: Patient does not complain of this today.  Continue ibuprofen sparingly as needed. ?7.  Hypokalemia: Mild, monitor. ?8.  Hypertension: Improved.  Proceed with treatment as above. ?9.  Hyponatremia: Mild, monitor. ? ?Patient expressed understanding and was in agreement with this plan. She also understands that She can call clinic at any time with any questions, concerns, or complaints.  ? ? 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

## 2021-05-13 ENCOUNTER — Other Ambulatory Visit: Payer: Self-pay

## 2021-05-13 ENCOUNTER — Inpatient Hospital Stay (HOSPITAL_BASED_OUTPATIENT_CLINIC_OR_DEPARTMENT_OTHER): Payer: Medicaid Other | Admitting: Oncology

## 2021-05-13 ENCOUNTER — Inpatient Hospital Stay: Payer: Medicaid Other

## 2021-05-13 ENCOUNTER — Encounter: Payer: Self-pay | Admitting: Oncology

## 2021-05-13 VITALS — BP 132/98 | HR 99 | Temp 97.2°F | Resp 16 | Wt 145.8 lb

## 2021-05-13 DIAGNOSIS — Z5111 Encounter for antineoplastic chemotherapy: Secondary | ICD-10-CM | POA: Diagnosis not present

## 2021-05-13 DIAGNOSIS — C50912 Malignant neoplasm of unspecified site of left female breast: Secondary | ICD-10-CM

## 2021-05-13 DIAGNOSIS — C50911 Malignant neoplasm of unspecified site of right female breast: Secondary | ICD-10-CM

## 2021-05-13 LAB — CBC WITH DIFFERENTIAL/PLATELET
Abs Immature Granulocytes: 0.01 10*3/uL (ref 0.00–0.07)
Basophils Absolute: 0 10*3/uL (ref 0.0–0.1)
Basophils Relative: 0 %
Eosinophils Absolute: 0.1 10*3/uL (ref 0.0–0.5)
Eosinophils Relative: 3 %
HCT: 38.2 % (ref 36.0–46.0)
Hemoglobin: 12.6 g/dL (ref 12.0–15.0)
Immature Granulocytes: 0 %
Lymphocytes Relative: 30 %
Lymphs Abs: 1.1 10*3/uL (ref 0.7–4.0)
MCH: 34.7 pg — ABNORMAL HIGH (ref 26.0–34.0)
MCHC: 33 g/dL (ref 30.0–36.0)
MCV: 105.2 fL — ABNORMAL HIGH (ref 80.0–100.0)
Monocytes Absolute: 0.4 10*3/uL (ref 0.1–1.0)
Monocytes Relative: 12 %
Neutro Abs: 1.9 10*3/uL (ref 1.7–7.7)
Neutrophils Relative %: 55 %
Platelets: 239 10*3/uL (ref 150–400)
RBC: 3.63 MIL/uL — ABNORMAL LOW (ref 3.87–5.11)
RDW: 13.8 % (ref 11.5–15.5)
WBC: 3.5 10*3/uL — ABNORMAL LOW (ref 4.0–10.5)
nRBC: 0 % (ref 0.0–0.2)

## 2021-05-13 LAB — COMPREHENSIVE METABOLIC PANEL
ALT: 12 U/L (ref 0–44)
AST: 20 U/L (ref 15–41)
Albumin: 3.8 g/dL (ref 3.5–5.0)
Alkaline Phosphatase: 65 U/L (ref 38–126)
Anion gap: 9 (ref 5–15)
BUN: 10 mg/dL (ref 6–20)
CO2: 22 mmol/L (ref 22–32)
Calcium: 8.9 mg/dL (ref 8.9–10.3)
Chloride: 103 mmol/L (ref 98–111)
Creatinine, Ser: 0.81 mg/dL (ref 0.44–1.00)
GFR, Estimated: >60 mL/min (ref >60)
Glucose, Bld: 112 mg/dL — ABNORMAL HIGH (ref 70–99)
Potassium: 3.4 mmol/L — ABNORMAL LOW (ref 3.5–5.1)
Sodium: 134 mmol/L — ABNORMAL LOW (ref 135–145)
Total Bilirubin: 0.3 mg/dL (ref 0.3–1.2)
Total Protein: 7 g/dL (ref 6.5–8.1)

## 2021-05-13 MED ORDER — FAMOTIDINE IN NACL 20-0.9 MG/50ML-% IV SOLN
20.0000 mg | Freq: Once | INTRAVENOUS | Status: AC
  Administered 2021-05-13: 20 mg via INTRAVENOUS
  Filled 2021-05-13: qty 50

## 2021-05-13 MED ORDER — DIPHENHYDRAMINE HCL 50 MG/ML IJ SOLN
25.0000 mg | Freq: Once | INTRAMUSCULAR | Status: AC
  Administered 2021-05-13: 25 mg via INTRAVENOUS
  Filled 2021-05-13: qty 1

## 2021-05-13 MED ORDER — SODIUM CHLORIDE 0.9 % IV SOLN
Freq: Once | INTRAVENOUS | Status: AC
  Filled 2021-05-13: qty 250

## 2021-05-13 MED ORDER — HEPARIN SOD (PORK) LOCK FLUSH 100 UNIT/ML IV SOLN
INTRAVENOUS | Status: AC
  Filled 2021-05-13: qty 5

## 2021-05-13 MED ORDER — HEPARIN SOD (PORK) LOCK FLUSH 100 UNIT/ML IV SOLN
500.0000 [IU] | Freq: Once | INTRAVENOUS | Status: AC
  Administered 2021-05-13: 500 [IU] via INTRAVENOUS
  Filled 2021-05-13: qty 5

## 2021-05-13 MED ORDER — SODIUM CHLORIDE 0.9 % IV SOLN
10.0000 mg | Freq: Once | INTRAVENOUS | Status: AC
  Administered 2021-05-13: 10 mg via INTRAVENOUS
  Filled 2021-05-13: qty 10

## 2021-05-13 MED ORDER — SODIUM CHLORIDE 0.9% FLUSH
10.0000 mL | INTRAVENOUS | Status: DC | PRN
  Administered 2021-05-13: 10 mL via INTRAVENOUS
  Filled 2021-05-13: qty 10

## 2021-05-13 MED ORDER — SODIUM CHLORIDE 0.9 % IV SOLN
80.0000 mg/m2 | Freq: Once | INTRAVENOUS | Status: AC
  Administered 2021-05-13: 132 mg via INTRAVENOUS
  Filled 2021-05-13: qty 22

## 2021-05-13 NOTE — Patient Instructions (Signed)
[facility] CANCER CTR AT [place]  Discharge Instructions: ?Thank you for choosing [facility] to provide your oncology and hematology care.  ?If you have a lab appointment with the [facility], please go directly to the [facility] and check in at the registration area. ? ?Wear comfortable clothing and clothing appropriate for easy access to any Portacath or PICC line.  ? ?We strive to give you quality time with your provider. You may need to reschedule your appointment if you arrive late (15 or more minutes).  Arriving late affects you and other patients whose appointments are after yours.  Also, if you miss three or more appointments without notifying the office, you may be dismissed from the clinic at the provider?s discretion.    ?  ?For prescription refill requests, have your pharmacy contact our office and allow 72 hours for refills to be completed.   ? ?Today you received the following chemotherapy and/or immunotherapy agents: Taxol    ?  ?To help prevent nausea and vomiting after your treatment, we encourage you to take your nausea medication as directed. ? ?BELOW ARE SYMPTOMS THAT SHOULD BE REPORTED IMMEDIATELY: ?*FEVER GREATER THAN 100.4 F (38 ?C) OR HIGHER ?*CHILLS OR SWEATING ?*NAUSEA AND VOMITING THAT IS NOT CONTROLLED WITH YOUR NAUSEA MEDICATION ?*UNUSUAL SHORTNESS OF BREATH ?*UNUSUAL BRUISING OR BLEEDING ?*URINARY PROBLEMS (pain or burning when urinating, or frequent urination) ?*BOWEL PROBLEMS (unusual diarrhea, constipation, pain near the anus) ?TENDERNESS IN MOUTH AND THROAT WITH OR WITHOUT PRESENCE OF ULCERS (sore throat, sores in mouth, or a toothache) ?UNUSUAL RASH, SWELLING OR PAIN  ?UNUSUAL VAGINAL DISCHARGE OR ITCHING  ? ?Items with * indicate a potential emergency and should be followed up as soon as possible or go to the Emergency Department if any problems should occur. ? ?Please show the CHEMOTHERAPY ALERT CARD or IMMUNOTHERAPY ALERT CARD at check-in to the  Emergency Department and triage nurse. ? ?Should you have questions after your visit or need to cancel or reschedule your appointment, please contact [facility] CANCER [place] AT [place]  [phone] and follow the prompts.  Office hours are 8:00 a.m. to 4:30 p.m. Monday - Friday. Please note that voicemails left after 4:00 p.m. may not be returned until the following business day.  We are closed weekends and major holidays. You have access to a nurse at all times for urgent questions. Please call the main number to the clinic [phone] and follow the prompts. ? ?For any non-urgent questions, you may also contact your provider using MyChart. We now offer e-Visits for anyone 35 and older to request care online for non-urgent symptoms. For details visit [email or web address]. ?  ?Also download the MyChart app! Go to the app store, search "MyChart", open the app, select [facility], and log in with your MyChart username and password. ? ?Due to Covid, a mask is required upon entering the hospital/clinic. If you do not have a mask, one will be given to you upon arrival. For doctor visits, patients may have 1 support person aged 26 or older with them. For treatment visits, patients cannot have anyone with them due to current Covid guidelines and our immunocompromised population. Paclitaxel injection ?What is this medication? ?PACLITAXEL (PAK li TAX el) is a chemotherapy drug. It targets fast dividing cells, like cancer cells, and causes these cells to die. This medicine is used to treat ovarian cancer, breast cancer, lung cancer, Kaposi's sarcoma, and other cancers. ?This medicine may be used for other purposes; ask your health  care provider or pharmacist if you have questions. ?COMMON BRAND NAME(S): Onxol, Taxol ?What should I tell my care team before I take this medication? ?They need to know if you have any of these conditions: ?history of irregular heartbeat ?liver disease ?low blood counts, like low  white cell, platelet, or red cell counts ?lung or breathing disease, like asthma ?tingling of the fingers or toes, or other nerve disorder ?an unusual or allergic reaction to paclitaxel, alcohol, polyoxyethylated castor oil, other chemotherapy, other medicines, foods, dyes, or preservatives ?pregnant or trying to get pregnant ?breast-feeding ?How should I use this medication? ?This drug is given as an infusion into a vein. It is administered in a hospital or clinic by a specially trained health care professional. ?Talk to your pediatrician regarding the use of this medicine in children. Special care may be needed. ?Overdosage: If you think you have taken too much of this medicine contact a poison control center or emergency room at once. ?NOTE: This medicine is only for you. Do not share this medicine with others. ?What if I miss a dose? ?It is important not to miss your dose. Call your doctor or health care professional if you are unable to keep an appointment. ?What may interact with this medication? ?Do not take this medicine with any of the following medications: ?live virus vaccines ?This medicine may also interact with the following medications: ?antiviral medicines for hepatitis, HIV or AIDS ?certain antibiotics like erythromycin and clarithromycin ?certain medicines for fungal infections like ketoconazole and itraconazole ?certain medicines for seizures like carbamazepine, phenobarbital, phenytoin ?gemfibrozil ?nefazodone ?rifampin ?St. John's wort ?This list may not describe all possible interactions. Give your health care provider a list of all the medicines, herbs, non-prescription drugs, or dietary supplements you use. Also tell them if you smoke, drink alcohol, or use illegal drugs. Some items may interact with your medicine. ?What should I watch for while using this medication? ?Your condition will be monitored carefully while you are receiving this medicine. You will need important blood work done  while you are taking this medicine. ?This medicine can cause serious allergic reactions. To reduce your risk you will need to take other medicine(s) before treatment with this medicine. If you experience allergic reactions like skin rash, itching or hives, swelling of the face, lips, or tongue, tell your doctor or health care professional right away. ?In some cases, you may be given additional medicines to help with side effects. Follow all directions for their use. ?This drug may make you feel generally unwell. This is not uncommon, as chemotherapy can affect healthy cells as well as cancer cells. Report any side effects. Continue your course of treatment even though you feel ill unless your doctor tells you to stop. ?Call your doctor or health care professional for advice if you get a fever, chills or sore throat, or other symptoms of a cold or flu. Do not treat yourself. This drug decreases your body's ability to fight infections. Try to avoid being around people who are sick. ?This medicine may increase your risk to bruise or bleed. Call your doctor or health care professional if you notice any unusual bleeding. ?Be careful brushing and flossing your teeth or using a toothpick because you may get an infection or bleed more easily. If you have any dental work done, tell your dentist you are receiving this medicine. ?Avoid taking products that contain aspirin, acetaminophen, ibuprofen, naproxen, or ketoprofen unless instructed by your doctor. These medicines may hide a fever. ?Do  not become pregnant while taking this medicine. Women should inform their doctor if they wish to become pregnant or think they might be pregnant. There is a potential for serious side effects to an unborn child. Talk to your health care professional or pharmacist for more information. Do not breast-feed an infant while taking this medicine. ?Men are advised not to father a child while receiving this medicine. ?This product may contain  alcohol. Ask your pharmacist or healthcare provider if this medicine contains alcohol. Be sure to tell all healthcare providers you are taking this medicine. Certain medicines, like metronidazole and disulfira

## 2021-05-14 ENCOUNTER — Encounter: Payer: Self-pay | Admitting: Oncology

## 2021-05-18 NOTE — Progress Notes (Signed)
[place] today ?[place]  ?[phone] [phone] ? ?ID: [name] OB: May 04, 1975  MR#: [id number]  DSK#:[id number] ? ?Patient Care Team: ?Patient, [name] as PCP - General (General Practice) ?Rico Junker, RN as Registered Nurse ?Theodore Demark, RN as Registered Nurse ? 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 ? ?INTERVAL HISTORY: Patient returns to clinic today for further evaluation and consideration of cycle 10 of weekly Taxol.  She has had difficulty sleeping over the past several nights, but otherwise is felt well.  She continues to tolerate her treatments without significant side effects.  She denies any peripheral neuropathy.  She has no neurologic complaints.  She denies any recent fevers or illnesses.  She has a good appetite and denies weight loss.  She has no chest pain, shortness of breath, cough, or hemoptysis.  She denies any nausea, vomiting, constipation, or diarrhea.  She has no urinary complaints.  Patient offers no further specific complaints today. ? ?REVIEW OF SYSTEMS:   ?Review of Systems  ?Constitutional: Negative.  Negative for chills, fever and malaise/fatigue.  ?Respiratory: Negative.  Negative for cough, hemoptysis and shortness of breath.   ?Cardiovascular: Negative.  Negative for chest pain and leg swelling.  ?Gastrointestinal: Negative.  Negative for abdominal pain and nausea.  ?Genitourinary: Negative.  Negative for dysuria.  ?Musculoskeletal: Negative.  Negative for back pain.  ?Skin: Negative.  Negative for rash.  ?Neurological: Negative.  Negative for dizziness, focal weakness, weakness and headaches.  ?Psychiatric/Behavioral:  The patient has insomnia. The patient is not nervous/anxious.   ? ?As per HPI. Otherwise, a complete review of systems is negative. ? ?PAST MEDICAL HISTORY: ?Past Medical History:  ?Diagnosis Date  ? Anxiety   ? Headache   ? Hypertension   ? Noncompliance   ? ? ?PAST SURGICAL  HISTORY: ?Past Surgical History:  ?Procedure Laterality Date  ? BREAST BIOPSY Right 12/24/2020  ? mass, 9:00 8 cmfn venus marker, path pending  ? BREAST BIOPSY Left 12/24/2020  ? mass 3:00 retroareolar, ribbon, path pending  ? BREAST BIOPSY Left 12/24/2020  ? mass 3:00 5cmfn, heart marker, path pending  ? BREAST BIOPSY Left 12/24/2020  ? axilla LN, hyrdomarker shape 3 coil, path pending  ? PORTACATH PLACEMENT Right 01/01/2021  ? Procedure: INSERTION PORT-A-CATH;  Surgeon: Ronny Bacon, MD;  Location: [facility] ORS;  Service: General;  Laterality: Right;  ? TUBAL LIGATION    ? ? ?FAMILY HISTORY: ?Family History  ?Problem Relation Age of Onset  ? COPD Mother   ? Hypertension Mother   ? Heart disease Father   ? ? ?ADVANCED DIRECTIVES (Y/N):  N ? ?HEALTH MAINTENANCE: ?Social History  ? ?Tobacco Use  ? Smoking status: Every Day  ?  Packs/day: 1.50  ?  Types: Cigarettes  ? Smokeless tobacco: Never  ?Vaping Use  ? Vaping Use: Never used  ?Substance Use Topics  ? Alcohol use: Yes  ?  Alcohol/week: 14.0 standard drinks  ?  Types: 14 Cans of beer per week  ?  Comment: at least 2 beers daily  ? Drug use: No  ? ? ? Colonoscopy: ? PAP: ? Bone density: ? Lipid panel: ? ?Allergies  ?Allergen Reactions  ? Codeine Itching and Nausea And Vomiting  ? ? ?Current Outpatient Medications  ?Medication Sig Dispense Refill  ? acetaminophen (TYLENOL) 325 MG tablet Take 650 mg by mouth every 6 (six) hours as needed.    ? bacitracin ointment Apply 1 application topically 2 (two)  times daily. 120 g 0  ? diphenhydrAMINE HCl (BENADRYL ALLERGY PO) Take by mouth.    ? diphenhydrAMINE-zinc acetate (BENADRYL EXTRA STRENGTH) cream Apply 1 application topically 3 (three) times daily as needed for itching. 28.4 g 0  ? ibuprofen (ADVIL) 800 MG tablet Take 1 tablet (800 mg total) by mouth every 8 (eight) hours as needed. 30 tablet 0  ? labetalol (NORMODYNE) 100 MG tablet Take 1 tablet (100 mg total) by mouth 2 (two) times daily. 60 tablet 1  ?  lidocaine-prilocaine (EMLA) cream Apply to affected area once 30 g 3  ? loratadine (CLARITIN) 10 MG tablet Take 10 mg by mouth daily.    ? ALPRAZolam (XANAX) 0.25 MG tablet Take 1 tablet (0.25 mg total) by mouth at bedtime. 30 tablet 2  ? ondansetron (ZOFRAN) 8 MG tablet Take 1 tablet (8 mg total) by mouth 2 (two) times daily. (Patient not taking: Reported on 05/13/2021) 60 tablet 2  ? prednisoLONE (ORAPRED) 15 MG/5ML solution Take by mouth. (Patient not taking: Reported on 05/06/2021)    ? prochlorperazine (COMPAZINE) 10 MG tablet Take 1 tablet (10 mg total) by mouth every 6 (six) hours as needed for nausea or vomiting. (Patient not taking: Reported on 05/13/2021) 60 tablet 2  ? ?No current facility-administered medications for this visit.  ? ? ?OBJECTIVE: ?Vitals:  ? 05/20/21 0838  ?BP: (!) 136/106  ?Pulse: 94  ?Resp: 16  ?Temp: (!) 97.5 ?F (36.4 ?C)  ?SpO2: 100%  ?   Body mass index is 24.44 kg/m?Marland Kitchen    ECOG FS:0 - Asymptomatic ? ?General: Well-developed, well-nourished, no acute distress. ?Eyes: Pink conjunctiva, anicteric sclera. ?HEENT: Normocephalic, moist mucous membranes. ?Lungs: No audible wheezing or coughing. ?Heart: Regular rate and rhythm. ?Abdomen: Soft, nontender, no obvious distention. ?Musculoskeletal: No edema, cyanosis, or clubbing. ?Neuro: Alert, answering all questions appropriately. Cranial nerves grossly intact. ?Skin: No rashes or petechiae noted. ?Psych: Normal affect. ? ? ?LAB RESULTS: ? ?Lab Results  ?Component Value Date  ? NA 136 05/20/2021  ? K 3.4 (L) 05/20/2021  ? CL 104 05/20/2021  ? CO2 23 05/20/2021  ? GLUCOSE 105 (H) 05/20/2021  ? BUN <5 (L) 05/20/2021  ? CREATININE 0.58 05/20/2021  ? CALCIUM 8.8 (L) 05/20/2021  ? PROT 6.9 05/20/2021  ? ALBUMIN 3.7 05/20/2021  ? AST 20 05/20/2021  ? ALT 12 05/20/2021  ? ALKPHOS 66 05/20/2021  ? BILITOT 0.4 05/20/2021  ? GFRNONAA >60 05/20/2021  ? GFRAA >60 12/01/2017  ? ? ?Lab Results  ?Component Value Date  ? WBC 6.7 05/20/2021  ? NEUTROABS 5.2  05/20/2021  ? HGB 12.6 05/20/2021  ? HCT 38.0 05/20/2021  ? MCV 105.8 (H) 05/20/2021  ? PLT 218 05/20/2021  ? ? ? ?STUDIES: ?No results found. ? 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 ? ?PLAN:   ? ?1.  Bilateral breast cancer: Case discussed with pathology and this appears to be 2 distinct breast cancer primaries in her right and left breast both of which are ER/PR, HER-2 negative malignancies.  CT scan completed on December 30, 2020 reviewed independently and reported as above did not reveal any metastatic disease.  MUGA scan on January 08, 2021 reported an EF of 57.3%.  Patient has also had port placement.  Previously, surgery recommended upfront systemic therapy. Given patient's age, she will also require genetic testing.  Plan to give neoadjuvant Adriamycin plus Cytoxan every 2 weeks with Neulasta support for 4 cycles followed by weekly Taxol for 12 cycles.  Patient also  may benefit from adjuvant Xeloda.  Patient completed cycle 4 of Adriamycin and Cytoxan on March 05, 2021.  Proceed with cycle 10 of weekly Taxol today.  Return to clinic in 1 week for further evaluation and consideration of cycle 11.  Patient will need a referral back to surgery in the near future.    ?2.  Nausea: Resolved.  Continue Compazine as needed. ?3.  Anxiety/insomnia: Patient states last several days have been worse.  She has been instructed that she can increase her alprazolam to 0.25 mg to 2 tabs as needed.   ?4.  Leukopenia: Resolved. ?5.  Anemia: Resolved. ?6.  Pain: Patient does not complain of this today.  Continue ibuprofen sparingly as needed. ?7.  Hypokalemia: Chronic and unchanged, monitor.   ?8.  Hypertension: Improved.  Proceed with treatment as above. ?9.  Hyponatremia: Resolved. ? ?Patient expressed understanding and was in agreement with this plan. She also understands that She can call clinic at any time with any questions, concerns, or complaints.  ? ? 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

## 2021-05-20 ENCOUNTER — Encounter: Payer: Self-pay | Admitting: Oncology

## 2021-05-20 ENCOUNTER — Inpatient Hospital Stay: Payer: Medicaid Other

## 2021-05-20 ENCOUNTER — Inpatient Hospital Stay (HOSPITAL_BASED_OUTPATIENT_CLINIC_OR_DEPARTMENT_OTHER): Payer: Medicaid Other | Admitting: Oncology

## 2021-05-20 ENCOUNTER — Other Ambulatory Visit: Payer: Self-pay | Admitting: Emergency Medicine

## 2021-05-20 VITALS — BP 136/106 | HR 94 | Temp 97.5°F | Resp 16 | Wt 142.4 lb

## 2021-05-20 DIAGNOSIS — C50912 Malignant neoplasm of unspecified site of left female breast: Secondary | ICD-10-CM

## 2021-05-20 DIAGNOSIS — Z5111 Encounter for antineoplastic chemotherapy: Secondary | ICD-10-CM | POA: Diagnosis not present

## 2021-05-20 DIAGNOSIS — C50911 Malignant neoplasm of unspecified site of right female breast: Secondary | ICD-10-CM

## 2021-05-20 LAB — CBC WITH DIFFERENTIAL/PLATELET
Abs Immature Granulocytes: 0.02 10*3/uL (ref 0.00–0.07)
Basophils Absolute: 0 10*3/uL (ref 0.0–0.1)
Basophils Relative: 0 %
Eosinophils Absolute: 0.1 10*3/uL (ref 0.0–0.5)
Eosinophils Relative: 1 %
HCT: 38 % (ref 36.0–46.0)
Hemoglobin: 12.6 g/dL (ref 12.0–15.0)
Immature Granulocytes: 0 %
Lymphocytes Relative: 14 %
Lymphs Abs: 0.9 10*3/uL (ref 0.7–4.0)
MCH: 35.1 pg — ABNORMAL HIGH (ref 26.0–34.0)
MCHC: 33.2 g/dL (ref 30.0–36.0)
MCV: 105.8 fL — ABNORMAL HIGH (ref 80.0–100.0)
Monocytes Absolute: 0.6 10*3/uL (ref 0.1–1.0)
Monocytes Relative: 8 %
Neutro Abs: 5.2 10*3/uL (ref 1.7–7.7)
Neutrophils Relative %: 77 %
Platelets: 218 10*3/uL (ref 150–400)
RBC: 3.59 MIL/uL — ABNORMAL LOW (ref 3.87–5.11)
RDW: 13.4 % (ref 11.5–15.5)
WBC: 6.7 10*3/uL (ref 4.0–10.5)
nRBC: 0 % (ref 0.0–0.2)

## 2021-05-20 LAB — COMPREHENSIVE METABOLIC PANEL
ALT: 12 U/L (ref 0–44)
AST: 20 U/L (ref 15–41)
Albumin: 3.7 g/dL (ref 3.5–5.0)
Alkaline Phosphatase: 66 U/L (ref 38–126)
Anion gap: 9 (ref 5–15)
BUN: <5 mg/dL — ABNORMAL LOW (ref 6–20)
CO2: 23 mmol/L (ref 22–32)
Calcium: 8.8 mg/dL — ABNORMAL LOW (ref 8.9–10.3)
Chloride: 104 mmol/L (ref 98–111)
Creatinine, Ser: 0.58 mg/dL (ref 0.44–1.00)
GFR, Estimated: >60 mL/min (ref >60)
Glucose, Bld: 105 mg/dL — ABNORMAL HIGH (ref 70–99)
Potassium: 3.4 mmol/L — ABNORMAL LOW (ref 3.5–5.1)
Sodium: 136 mmol/L (ref 135–145)
Total Bilirubin: 0.4 mg/dL (ref 0.3–1.2)
Total Protein: 6.9 g/dL (ref 6.5–8.1)

## 2021-05-20 MED ORDER — HEPARIN SOD (PORK) LOCK FLUSH 100 UNIT/ML IV SOLN
INTRAVENOUS | Status: AC
  Filled 2021-05-20: qty 5

## 2021-05-20 MED ORDER — SODIUM CHLORIDE 0.9 % IV SOLN
Freq: Once | INTRAVENOUS | Status: AC
  Filled 2021-05-20: qty 250

## 2021-05-20 MED ORDER — SODIUM CHLORIDE 0.9% FLUSH
10.0000 mL | Freq: Once | INTRAVENOUS | Status: AC
  Administered 2021-05-20: 10 mL via INTRAVENOUS
  Filled 2021-05-20: qty 10

## 2021-05-20 MED ORDER — SODIUM CHLORIDE 0.9 % IV SOLN
80.0000 mg/m2 | Freq: Once | INTRAVENOUS | Status: AC
  Administered 2021-05-20: 132 mg via INTRAVENOUS
  Filled 2021-05-20: qty 22

## 2021-05-20 MED ORDER — ALPRAZOLAM 0.25 MG PO TABS: 0.2500 mg | ORAL_TABLET | Freq: Every day | ORAL | 2 refills | Status: DC

## 2021-05-20 MED ORDER — DIPHENHYDRAMINE HCL 50 MG/ML IJ SOLN
25.0000 mg | Freq: Once | INTRAMUSCULAR | Status: AC
  Administered 2021-05-20: 25 mg via INTRAVENOUS
  Filled 2021-05-20: qty 1

## 2021-05-20 MED ORDER — FAMOTIDINE IN NACL 20-0.9 MG/50ML-% IV SOLN
20.0000 mg | Freq: Once | INTRAVENOUS | Status: AC
  Administered 2021-05-20: 20 mg via INTRAVENOUS
  Filled 2021-05-20: qty 50

## 2021-05-20 MED ORDER — HEPARIN SOD (PORK) LOCK FLUSH 100 UNIT/ML IV SOLN
500.0000 [IU] | Freq: Once | INTRAVENOUS | Status: AC | PRN
  Administered 2021-05-20: 500 [IU]
  Filled 2021-05-20: qty 5

## 2021-05-20 MED ORDER — SODIUM CHLORIDE 0.9 % IV SOLN
10.0000 mg | Freq: Once | INTRAVENOUS | Status: AC
  Administered 2021-05-20: 10 mg via INTRAVENOUS
  Filled 2021-05-20: qty 10

## 2021-05-20 NOTE — Patient Instructions (Signed)
[facility] AT [place]  Discharge Instructions: ?Thank you for choosing [facility] to provide your oncology and hematology care.  ?If you have a lab appointment with the [facility], please go directly to the [facility] and check in at the registration area. ? ?Wear comfortable clothing and clothing appropriate for easy access to any Portacath or PICC line.  ? ?We strive to give you quality time with your provider. You may need to reschedule your appointment if you arrive late (15 or more minutes).  Arriving late affects you and other patients whose appointments are after yours.  Also, if you miss three or more appointments without notifying the office, you may be dismissed from the clinic at the provider?s discretion.    ?  ?For prescription refill requests, have your pharmacy contact our office and allow 72 hours for refills to be completed.   ? ?Today you received the following chemotherapy and/or immunotherapy agents: Taxol    ?  ?To help prevent nausea and vomiting after your treatment, we encourage you to take your nausea medication as directed. ? ?BELOW ARE SYMPTOMS THAT SHOULD BE REPORTED IMMEDIATELY: ?*FEVER GREATER THAN 100.4 F (38 ?C) OR HIGHER ?*CHILLS OR SWEATING ?*NAUSEA AND VOMITING THAT IS NOT CONTROLLED WITH YOUR NAUSEA MEDICATION ?*UNUSUAL SHORTNESS OF BREATH ?*UNUSUAL BRUISING OR BLEEDING ?*URINARY PROBLEMS (pain or burning when urinating, or frequent urination) ?*BOWEL PROBLEMS (unusual diarrhea, constipation, pain near the anus) ?TENDERNESS IN MOUTH AND THROAT WITH OR WITHOUT PRESENCE OF ULCERS (sore throat, sores in mouth, or a toothache) ?UNUSUAL RASH, SWELLING OR PAIN  ?UNUSUAL VAGINAL DISCHARGE OR ITCHING  ? ?Items with * indicate a potential emergency and should be followed up as soon as possible or go to the Emergency Department if any problems should occur. ? ?Please show the CHEMOTHERAPY ALERT CARD or IMMUNOTHERAPY ALERT CARD at check-in to the  Emergency Department and triage nurse. ? ?Should you have questions after your visit or need to cancel or reschedule your appointment, please contact [facility] [place] AT [place]  [phone] and follow the prompts.  Office hours are 8:00 a.m. to 4:30 p.m. Monday - Friday. Please note that voicemails left after 4:00 p.m. may not be returned until the following business day.  We are closed weekends and major holidays. You have access to a nurse at all times for urgent questions. Please call the main number to the clinic [phone] and follow the prompts. ? ?For any non-urgent questions, you may also contact your provider using MyChart. We now offer e-Visits for anyone 59 and older to request care online for non-urgent symptoms. For details visit [email or web address]. ?  ?Also download the MyChart app! Go to the app store, search "MyChart", open the app, select [employer], and log in with your MyChart username and password. ? ?Due to Covid, a mask is required upon entering the hospital/clinic. If you do not have a mask, one will be given to you upon arrival. For doctor visits, patients may have 1 support person aged 35 or older with them. For treatment visits, patients cannot have anyone with them due to current Covid guidelines and our immunocompromised population. Paclitaxel injection ?What is this medication? ?PACLITAXEL (PAK li TAX el) is a chemotherapy drug. It targets fast dividing cells, like cancer cells, and causes these cells to die. This medicine is used to treat ovarian cancer, breast cancer, lung cancer, Kaposi's sarcoma, and other cancers. ?This medicine may be used for other purposes; ask your health  care provider or pharmacist if you have questions. ?COMMON BRAND NAME(S): Onxol, Taxol ?What should I tell my care team before I take this medication? ?They need to know if you have any of these conditions: ?history of irregular heartbeat ?liver disease ?low blood counts, like low  white cell, platelet, or red cell counts ?lung or breathing disease, like asthma ?tingling of the fingers or toes, or other nerve disorder ?an unusual or allergic reaction to paclitaxel, alcohol, polyoxyethylated castor oil, other chemotherapy, other medicines, foods, dyes, or preservatives ?pregnant or trying to get pregnant ?breast-feeding ?How should I use this medication? ?This drug is given as an infusion into a vein. It is administered in a hospital or clinic by a specially trained health care professional. ?Talk to your pediatrician regarding the use of this medicine in children. Special care may be needed. ?Overdosage: If you think you have taken too much of this medicine contact a poison control center or emergency room at once. ?NOTE: This medicine is only for you. Do not share this medicine with others. ?What if I miss a dose? ?It is important not to miss your dose. Call your doctor or health care professional if you are unable to keep an appointment. ?What may interact with this medication? ?Do not take this medicine with any of the following medications: ?live virus vaccines ?This medicine may also interact with the following medications: ?antiviral medicines for hepatitis, HIV or AIDS ?certain antibiotics like erythromycin and clarithromycin ?certain medicines for fungal infections like ketoconazole and itraconazole ?certain medicines for seizures like carbamazepine, phenobarbital, phenytoin ?gemfibrozil ?nefazodone ?rifampin ?St. John's wort ?This list may not describe all possible interactions. Give your health care provider a list of all the medicines, herbs, non-prescription drugs, or dietary supplements you use. Also tell them if you smoke, drink alcohol, or use illegal drugs. Some items may interact with your medicine. ?What should I watch for while using this medication? ?Your condition will be monitored carefully while you are receiving this medicine. You will need important blood work done  while you are taking this medicine. ?This medicine can cause serious allergic reactions. To reduce your risk you will need to take other medicine(s) before treatment with this medicine. If you experience allergic reactions like skin rash, itching or hives, swelling of the face, lips, or tongue, tell your doctor or health care professional right away. ?In some cases, you may be given additional medicines to help with side effects. Follow all directions for their use. ?This drug may make you feel generally unwell. This is not uncommon, as chemotherapy can affect healthy cells as well as cancer cells. Report any side effects. Continue your course of treatment even though you feel ill unless your doctor tells you to stop. ?Call your doctor or health care professional for advice if you get a fever, chills or sore throat, or other symptoms of a cold or flu. Do not treat yourself. This drug decreases your body's ability to fight infections. Try to avoid being around people who are sick. ?This medicine may increase your risk to bruise or bleed. Call your doctor or health care professional if you notice any unusual bleeding. ?Be careful brushing and flossing your teeth or using a toothpick because you may get an infection or bleed more easily. If you have any dental work done, tell your dentist you are receiving this medicine. ?Avoid taking products that contain aspirin, acetaminophen, ibuprofen, naproxen, or ketoprofen unless instructed by your doctor. These medicines may hide a fever. ?Do  not become pregnant while taking this medicine. Women should inform their doctor if they wish to become pregnant or think they might be pregnant. There is a potential for serious side effects to an unborn child. Talk to your health care professional or pharmacist for more information. Do not breast-feed an infant while taking this medicine. ?Men are advised not to father a child while receiving this medicine. ?This product may contain  alcohol. Ask your pharmacist or healthcare provider if this medicine contains alcohol. Be sure to tell all healthcare providers you are taking this medicine. Certain medicines, like metronidazole and disulfira

## 2021-05-21 ENCOUNTER — Encounter: Payer: Self-pay | Admitting: Oncology

## 2021-05-23 NOTE — Progress Notes (Signed)
[place] today ?[place]  ?[phone] [phone] ? ?ID: [name] OB: [birth year]  MR#: [id number]  ZGY#:[id number] ? ?Patient Care Team: ?Patient, [name] as PCP - General (General Practice) ?Rico Junker, RN as Registered Nurse ?Theodore Demark, RN as Registered Nurse ? 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 ? ?INTERVAL HISTORY: Patient returns to clinic today for further evaluation and consideration of cycle 11 of weekly Taxol.  She noted a peripheral neuropathy in her bilateral feet, but only occurred at night and did not affect her day-to-day activity.  She otherwise is tolerating her treatment well.  She did not complain of insomnia today.  She has no other neurologic complaints.  She denies any recent fevers or illnesses.  She has a good appetite and denies weight loss.  She has no chest pain, shortness of breath, cough, or hemoptysis.  She denies any nausea, vomiting, constipation, or diarrhea.  She has no urinary complaints.  Patient offers no further specific complaints today. ? ?REVIEW OF SYSTEMS:   ?Review of Systems  ?Constitutional: Negative.  Negative for chills, fever and malaise/fatigue.  ?Respiratory: Negative.  Negative for cough, hemoptysis and shortness of breath.   ?Cardiovascular: Negative.  Negative for chest pain and leg swelling.  ?Gastrointestinal: Negative.  Negative for abdominal pain and nausea.  ?Genitourinary: Negative.  Negative for dysuria.  ?Musculoskeletal: Negative.  Negative for back pain.  ?Skin: Negative.  Negative for rash.  ?Neurological:  Positive for sensory change. Negative for dizziness, focal weakness, weakness and headaches.  ?Psychiatric/Behavioral:  The patient is not nervous/anxious and does not have insomnia.   ? ?As per HPI. Otherwise, a complete review of systems is negative. ? ?PAST MEDICAL HISTORY: ?Past Medical History:  ?Diagnosis Date  ? Anxiety   ? Headache   ? Hypertension    ? Noncompliance   ? ? ?PAST SURGICAL HISTORY: ?Past Surgical History:  ?Procedure Laterality Date  ? BREAST BIOPSY Right 12/24/2020  ? mass, 9:00 8 cmfn venus marker, path pending  ? BREAST BIOPSY Left 12/24/2020  ? mass 3:00 retroareolar, ribbon, path pending  ? BREAST BIOPSY Left 12/24/2020  ? mass 3:00 5cmfn, heart marker, path pending  ? BREAST BIOPSY Left 12/24/2020  ? axilla LN, hyrdomarker shape 3 coil, path pending  ? PORTACATH PLACEMENT Right 01/01/2021  ? Procedure: INSERTION PORT-A-CATH;  Surgeon: Ronny Bacon, MD;  Location: [facility] ORS;  Service: General;  Laterality: Right;  ? TUBAL LIGATION    ? ? ?FAMILY HISTORY: ?Family History  ?Problem Relation Age of Onset  ? COPD Mother   ? Hypertension Mother   ? Heart disease Father   ? ? ?ADVANCED DIRECTIVES (Y/N):  N ? ?HEALTH MAINTENANCE: ?Social History  ? ?Tobacco Use  ? Smoking status: Every Day  ?  Packs/day: 1.50  ?  Types: Cigarettes  ? Smokeless tobacco: Never  ?Vaping Use  ? Vaping Use: Never used  ?Substance Use Topics  ? Alcohol use: Yes  ?  Alcohol/week: 14.0 standard drinks  ?  Types: 14 Cans of beer per week  ?  Comment: at least 2 beers daily  ? Drug use: No  ? ? ? Colonoscopy: ? PAP: ? Bone density: ? Lipid panel: ? ?Allergies  ?Allergen Reactions  ? Codeine Itching and Nausea And Vomiting  ? ? ?Current Outpatient Medications  ?Medication Sig Dispense Refill  ? acetaminophen (TYLENOL) 325 MG tablet Take 650 mg by mouth every 6 (six) hours as needed.    ?  ALPRAZolam (XANAX) 0.25 MG tablet Take 1 tablet (0.25 mg total) by mouth at bedtime. 30 tablet 2  ? bacitracin ointment Apply 1 application topically 2 (two) times daily. 120 g 0  ? diphenhydrAMINE HCl (BENADRYL ALLERGY PO) Take by mouth.    ? diphenhydrAMINE-zinc acetate (BENADRYL EXTRA STRENGTH) cream Apply 1 application topically 3 (three) times daily as needed for itching. 28.4 g 0  ? ibuprofen (ADVIL) 800 MG tablet Take 1 tablet (800 mg total) by mouth every 8 (eight) hours as needed.  30 tablet 0  ? labetalol (NORMODYNE) 100 MG tablet Take 1 tablet (100 mg total) by mouth 2 (two) times daily. 60 tablet 1  ? lidocaine-prilocaine (EMLA) cream Apply to affected area once 30 g 3  ? loratadine (CLARITIN) 10 MG tablet Take 10 mg by mouth daily.    ? ondansetron (ZOFRAN) 8 MG tablet Take 1 tablet (8 mg total) by mouth 2 (two) times daily. 60 tablet 2  ? prochlorperazine (COMPAZINE) 10 MG tablet Take 1 tablet (10 mg total) by mouth every 6 (six) hours as needed for nausea or vomiting. 60 tablet 2  ? prednisoLONE (ORAPRED) 15 MG/5ML solution Take by mouth. (Patient not taking: Reported on 05/06/2021)    ? ?No current facility-administered medications for this visit.  ? ?Facility-Administered Medications Ordered in Other Visits  ?Medication Dose Route Frequency Provider Last Rate Last Admin  ? heparin lock flush 100 unit/mL  500 Units Intracatheter Once PRN Lloyd Huger, MD      ? sodium chloride flush (NS) 0.9 % injection 10 mL  10 mL Intracatheter PRN Lloyd Huger, MD      ? ? ?OBJECTIVE: ?Vitals:  ? 05/27/21 0858  ?BP: (!) 133/98  ?Pulse: 86  ?Resp: 16  ?Temp: (!) 97.1 ?F (36.2 ?C)  ?SpO2: 100%  ?   Body mass index is 24.19 kg/m?Marland Kitchen    ECOG FS:0 - Asymptomatic ? ?General: Well-developed, well-nourished, no acute distress. ?Eyes: Pink conjunctiva, anicteric sclera. ?HEENT: Normocephalic, moist mucous membranes. ?Lungs: No audible wheezing or coughing. ?Heart: Regular rate and rhythm. ?Abdomen: Soft, nontender, no obvious distention. ?Musculoskeletal: No edema, cyanosis, or clubbing. ?Neuro: Alert, answering all questions appropriately. Cranial nerves grossly intact. ?Skin: No rashes or petechiae noted. ?Psych: Normal affect. ? ?LAB RESULTS: ? ?Lab Results  ?Component Value Date  ? NA 133 (L) 05/27/2021  ? K 3.5 05/27/2021  ? CL 101 05/27/2021  ? CO2 24 05/27/2021  ? GLUCOSE 147 (H) 05/27/2021  ? BUN 7 05/27/2021  ? CREATININE 0.81 05/27/2021  ? CALCIUM 8.9 05/27/2021  ? PROT 7.0 05/27/2021  ?  ALBUMIN 3.7 05/27/2021  ? AST 19 05/27/2021  ? ALT 11 05/27/2021  ? ALKPHOS 76 05/27/2021  ? BILITOT 0.4 05/27/2021  ? GFRNONAA >60 05/27/2021  ? GFRAA >60 12/01/2017  ? ? ?Lab Results  ?Component Value Date  ? WBC 6.6 05/27/2021  ? NEUTROABS 4.8 05/27/2021  ? HGB 12.6 05/27/2021  ? HCT 38.0 05/27/2021  ? MCV 104.1 (H) 05/27/2021  ? PLT 278 05/27/2021  ? ? ? ?STUDIES: ?No results found. ? 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 ? ?PLAN:   ? ?1.  Bilateral breast cancer: Case discussed with pathology and this appears to be 2 distinct breast cancer primaries in her right and left breast both of which are ER/PR, HER-2 negative malignancies.  CT scan completed on December 30, 2020 reviewed independently and reported as above did not reveal any metastatic disease.  MUGA scan on January 08, 2021 reported an EF of 57.3%.  Patient has also had port placement.  Previously, surgery recommended upfront systemic therapy. Given patient's age, she will also require genetic testing.  Plan to give neoadjuvant Adriamycin plus Cytoxan every 2 weeks with Neulasta support for 4 cycles followed by weekly Taxol for 12 cycles.  Patient also may benefit from adjuvant Xeloda.  Patient completed cycle 4 of Adriamycin and Cytoxan on March 05, 2021.  Proceed with cycle 11 of weekly Taxol today.  Return to clinic in 1 week for further evaluation and consideration of cycle 12.  Patient was given a referral back to surgery today.   2.  Nausea: Resolved.  Continue Compazine as needed. ?3.  Anxiety/insomnia: Improved.  She was previously instructed that she can increase her alprazolam to 0.25 mg to 2 tabs as needed.   ?4.  Pain: Patient does not complain of this today.  Continue ibuprofen sparingly as needed. ?5.  Hypokalemia: Resolved. ?6.  Hypertension: Improved.  Proceed with treatment as above. ?7.  Hyponatremia: Mild, monitor. ?8.  Peripheral neuropathy: Consider dose reduction for final treatment, but otherwise proceed as  planned. ? ?Patient expressed understanding and was in agreement with this plan. She also understands that She can call clinic at any time with any questions, concerns, or complaints.  ? ? [facility] [place]

## 2021-05-27 ENCOUNTER — Inpatient Hospital Stay: Payer: Medicaid Other | Attending: Oncology

## 2021-05-27 ENCOUNTER — Encounter: Payer: Self-pay | Admitting: Oncology

## 2021-05-27 ENCOUNTER — Inpatient Hospital Stay (HOSPITAL_BASED_OUTPATIENT_CLINIC_OR_DEPARTMENT_OTHER): Payer: Medicaid Other | Admitting: Oncology

## 2021-05-27 ENCOUNTER — Inpatient Hospital Stay: Payer: Medicaid Other

## 2021-05-27 VITALS — BP 133/98 | HR 86 | Temp 97.1°F | Resp 16 | Ht 64.0 in | Wt 140.9 lb

## 2021-05-27 VITALS — BP 160/114 | HR 87 | Resp 14

## 2021-05-27 DIAGNOSIS — Z5111 Encounter for antineoplastic chemotherapy: Secondary | ICD-10-CM | POA: Diagnosis present

## 2021-05-27 DIAGNOSIS — C50811 Malignant neoplasm of overlapping sites of right female breast: Secondary | ICD-10-CM | POA: Insufficient documentation

## 2021-05-27 DIAGNOSIS — Z79899 Other long term (current) drug therapy: Secondary | ICD-10-CM | POA: Diagnosis not present

## 2021-05-27 DIAGNOSIS — F17210 Nicotine dependence, cigarettes, uncomplicated: Secondary | ICD-10-CM | POA: Insufficient documentation

## 2021-05-27 DIAGNOSIS — E871 Hypo-osmolality and hyponatremia: Secondary | ICD-10-CM | POA: Insufficient documentation

## 2021-05-27 DIAGNOSIS — G4700 Insomnia, unspecified: Secondary | ICD-10-CM | POA: Insufficient documentation

## 2021-05-27 DIAGNOSIS — C50912 Malignant neoplasm of unspecified site of left female breast: Secondary | ICD-10-CM | POA: Diagnosis not present

## 2021-05-27 DIAGNOSIS — T451X5A Adverse effect of antineoplastic and immunosuppressive drugs, initial encounter: Secondary | ICD-10-CM | POA: Diagnosis not present

## 2021-05-27 DIAGNOSIS — G620 Drug-induced polyneuropathy: Secondary | ICD-10-CM | POA: Diagnosis not present

## 2021-05-27 DIAGNOSIS — I10 Essential (primary) hypertension: Secondary | ICD-10-CM | POA: Insufficient documentation

## 2021-05-27 DIAGNOSIS — C50812 Malignant neoplasm of overlapping sites of left female breast: Secondary | ICD-10-CM | POA: Insufficient documentation

## 2021-05-27 DIAGNOSIS — C50911 Malignant neoplasm of unspecified site of right female breast: Secondary | ICD-10-CM

## 2021-05-27 DIAGNOSIS — F419 Anxiety disorder, unspecified: Secondary | ICD-10-CM | POA: Diagnosis not present

## 2021-05-27 LAB — CBC WITH DIFFERENTIAL/PLATELET
Abs Immature Granulocytes: 0.02 10*3/uL (ref 0.00–0.07)
Basophils Absolute: 0 10*3/uL (ref 0.0–0.1)
Basophils Relative: 0 %
Eosinophils Absolute: 0.1 10*3/uL (ref 0.0–0.5)
Eosinophils Relative: 1 %
HCT: 38 % (ref 36.0–46.0)
Hemoglobin: 12.6 g/dL (ref 12.0–15.0)
Immature Granulocytes: 0 %
Lymphocytes Relative: 14 %
Lymphs Abs: 0.9 10*3/uL (ref 0.7–4.0)
MCH: 34.5 pg — ABNORMAL HIGH (ref 26.0–34.0)
MCHC: 33.2 g/dL (ref 30.0–36.0)
MCV: 104.1 fL — ABNORMAL HIGH (ref 80.0–100.0)
Monocytes Absolute: 0.8 10*3/uL (ref 0.1–1.0)
Monocytes Relative: 13 %
Neutro Abs: 4.8 10*3/uL (ref 1.7–7.7)
Neutrophils Relative %: 72 %
Platelets: 278 10*3/uL (ref 150–400)
RBC: 3.65 MIL/uL — ABNORMAL LOW (ref 3.87–5.11)
RDW: 13.3 % (ref 11.5–15.5)
WBC: 6.6 10*3/uL (ref 4.0–10.5)
nRBC: 0 % (ref 0.0–0.2)

## 2021-05-27 LAB — COMPREHENSIVE METABOLIC PANEL
ALT: 11 U/L (ref 0–44)
AST: 19 U/L (ref 15–41)
Albumin: 3.7 g/dL (ref 3.5–5.0)
Alkaline Phosphatase: 76 U/L (ref 38–126)
Anion gap: 8 (ref 5–15)
BUN: 7 mg/dL (ref 6–20)
CO2: 24 mmol/L (ref 22–32)
Calcium: 8.9 mg/dL (ref 8.9–10.3)
Chloride: 101 mmol/L (ref 98–111)
Creatinine, Ser: 0.81 mg/dL (ref 0.44–1.00)
GFR, Estimated: >60 mL/min (ref >60)
Glucose, Bld: 147 mg/dL — ABNORMAL HIGH (ref 70–99)
Potassium: 3.5 mmol/L (ref 3.5–5.1)
Sodium: 133 mmol/L — ABNORMAL LOW (ref 135–145)
Total Bilirubin: 0.4 mg/dL (ref 0.3–1.2)
Total Protein: 7 g/dL (ref 6.5–8.1)

## 2021-05-27 MED ORDER — SODIUM CHLORIDE 0.9% FLUSH
10.0000 mL | INTRAVENOUS | Status: DC | PRN
  Filled 2021-05-27: qty 10

## 2021-05-27 MED ORDER — SODIUM CHLORIDE 0.9 % IV SOLN
Freq: Once | INTRAVENOUS | Status: AC
  Filled 2021-05-27: qty 250

## 2021-05-27 MED ORDER — FAMOTIDINE IN NACL 20-0.9 MG/50ML-% IV SOLN
20.0000 mg | Freq: Once | INTRAVENOUS | Status: AC
  Administered 2021-05-27: 20 mg via INTRAVENOUS
  Filled 2021-05-27: qty 50

## 2021-05-27 MED ORDER — SODIUM CHLORIDE 0.9 % IV SOLN
10.0000 mg | Freq: Once | INTRAVENOUS | Status: AC
  Administered 2021-05-27: 10 mg via INTRAVENOUS
  Filled 2021-05-27: qty 10

## 2021-05-27 MED ORDER — SODIUM CHLORIDE 0.9% FLUSH
10.0000 mL | Freq: Once | INTRAVENOUS | Status: AC
  Administered 2021-05-27: 10 mL via INTRAVENOUS
  Filled 2021-05-27: qty 10

## 2021-05-27 MED ORDER — HEPARIN SOD (PORK) LOCK FLUSH 100 UNIT/ML IV SOLN
500.0000 [IU] | Freq: Once | INTRAVENOUS | Status: AC
  Administered 2021-05-27: 500 [IU] via INTRAVENOUS
  Filled 2021-05-27: qty 5

## 2021-05-27 MED ORDER — HEPARIN SOD (PORK) LOCK FLUSH 100 UNIT/ML IV SOLN
500.0000 [IU] | Freq: Once | INTRAVENOUS | Status: DC | PRN
  Filled 2021-05-27: qty 5

## 2021-05-27 MED ORDER — SODIUM CHLORIDE 0.9 % IV SOLN
80.0000 mg/m2 | Freq: Once | INTRAVENOUS | Status: AC
  Administered 2021-05-27: 132 mg via INTRAVENOUS
  Filled 2021-05-27: qty 22

## 2021-05-27 MED ORDER — DIPHENHYDRAMINE HCL 50 MG/ML IJ SOLN
25.0000 mg | Freq: Once | INTRAMUSCULAR | Status: AC
  Administered 2021-05-27: 25 mg via INTRAVENOUS
  Filled 2021-05-27: qty 1

## 2021-05-27 NOTE — Patient Instructions (Signed)
[facility]  Discharge Instructions: Thank you for choosing [facility] to provide your oncology and hematology care.  If you have a lab appointment with the Cancer Center, please go directly to the Cancer Center and check in at the registration area.  Wear comfortable clothing and clothing appropriate for easy access to any Portacath or PICC line.   We strive to give you quality time with your provider. You may need to reschedule your appointment if you arrive late (15 or more minutes).  Arriving late affects you and other patients whose appointments are after yours.  Also, if you miss three or more appointments without notifying the office, you may be dismissed from the clinic at the provider's discretion.      For prescription refill requests, have your pharmacy contact our office and allow 72 hours for refills to be completed.    Today you received the following chemotherapy and/or immunotherapy agents Taxol      To help prevent nausea and vomiting after your treatment, we encourage you to take your nausea medication as directed.  BELOW ARE SYMPTOMS THAT SHOULD BE REPORTED IMMEDIATELY: *FEVER GREATER THAN 100.4 F (38 C) OR HIGHER *CHILLS OR SWEATING *NAUSEA AND VOMITING THAT IS NOT CONTROLLED WITH YOUR NAUSEA MEDICATION *UNUSUAL SHORTNESS OF BREATH *UNUSUAL BRUISING OR BLEEDING *URINARY PROBLEMS (pain or burning when urinating, or frequent urination) *BOWEL PROBLEMS (unusual diarrhea, constipation, pain near the anus) TENDERNESS IN MOUTH AND THROAT WITH OR WITHOUT PRESENCE OF ULCERS (sore throat, sores in mouth, or a toothache) UNUSUAL RASH, SWELLING OR PAIN  UNUSUAL VAGINAL DISCHARGE OR ITCHING   Items with * indicate a potential emergency and should be followed up as soon as possible or go to the Emergency Department if any problems should occur.  Please show the CHEMOTHERAPY ALERT CARD or IMMUNOTHERAPY ALERT CARD at check-in to the  Emergency Department and triage nurse.  Should you have questions after your visit or need to cancel or reschedule your appointment, please contact [facility]  [phone] and follow the prompts.  Office hours are 8:00 a.m. to 4:30 p.m. Monday - Friday. Please note that voicemails left after 4:00 p.m. may not be returned until the following business day.  We are closed weekends and major holidays. You have access to a nurse at all times for urgent questions. Please call the main number to the clinic [phone] and follow the prompts.  For any non-urgent questions, you may also contact your provider using MyChart. We now offer e-Visits for anyone 18 and older to request care online for non-urgent symptoms. For details visit [email or web address].   Also download the MyChart app! Go to the app store, search "MyChart", open the app, select [facility], and log in with your MyChart username and password.  Due to Covid, a mask is required upon entering the hospital/clinic. If you do not have a mask, one will be given to you upon arrival. For doctor visits, patients may have 1 support person aged 18 or older with them. For treatment visits, patients cannot have anyone with them due to current Covid guidelines and our immunocompromised population.  

## 2021-05-27 NOTE — Progress Notes (Signed)
BP 160/114, post treatment. Patient complains of slight headache but feels this is related to sinus congestion. Dr. Grayland Ormond made aware. Per Dr. Grayland Ormond, okay to discharge home. Patient educated on signs and symptoms as to when to seek emergency care. Patient verbalizes understanding and denies any further questions or concerns.  ?

## 2021-06-03 ENCOUNTER — Inpatient Hospital Stay: Payer: Medicaid Other

## 2021-06-03 ENCOUNTER — Inpatient Hospital Stay (HOSPITAL_BASED_OUTPATIENT_CLINIC_OR_DEPARTMENT_OTHER): Payer: Medicaid Other | Admitting: Oncology

## 2021-06-03 VITALS — BP 153/110 | HR 99 | Temp 97.2°F | Resp 16 | Ht 64.0 in | Wt 149.2 lb

## 2021-06-03 DIAGNOSIS — C50911 Malignant neoplasm of unspecified site of right female breast: Secondary | ICD-10-CM

## 2021-06-03 DIAGNOSIS — C50912 Malignant neoplasm of unspecified site of left female breast: Secondary | ICD-10-CM | POA: Diagnosis not present

## 2021-06-03 DIAGNOSIS — Z5111 Encounter for antineoplastic chemotherapy: Secondary | ICD-10-CM | POA: Diagnosis not present

## 2021-06-03 LAB — CBC WITH DIFFERENTIAL/PLATELET
Abs Immature Granulocytes: 0.01 10*3/uL (ref 0.00–0.07)
Basophils Absolute: 0 10*3/uL (ref 0.0–0.1)
Basophils Relative: 1 %
Eosinophils Absolute: 0.1 10*3/uL (ref 0.0–0.5)
Eosinophils Relative: 3 %
HCT: 39.8 % (ref 36.0–46.0)
Hemoglobin: 13.1 g/dL (ref 12.0–15.0)
Immature Granulocytes: 0 %
Lymphocytes Relative: 35 %
Lymphs Abs: 1.2 10*3/uL (ref 0.7–4.0)
MCH: 34.4 pg — ABNORMAL HIGH (ref 26.0–34.0)
MCHC: 32.9 g/dL (ref 30.0–36.0)
MCV: 104.5 fL — ABNORMAL HIGH (ref 80.0–100.0)
Monocytes Absolute: 0.5 10*3/uL (ref 0.1–1.0)
Monocytes Relative: 14 %
Neutro Abs: 1.7 10*3/uL (ref 1.7–7.7)
Neutrophils Relative %: 47 %
Platelets: 290 10*3/uL (ref 150–400)
RBC: 3.81 MIL/uL — ABNORMAL LOW (ref 3.87–5.11)
RDW: 13.3 % (ref 11.5–15.5)
WBC: 3.6 10*3/uL — ABNORMAL LOW (ref 4.0–10.5)
nRBC: 0 % (ref 0.0–0.2)

## 2021-06-03 LAB — COMPREHENSIVE METABOLIC PANEL
ALT: 12 U/L (ref 0–44)
AST: 21 U/L (ref 15–41)
Albumin: 3.5 g/dL (ref 3.5–5.0)
Alkaline Phosphatase: 68 U/L (ref 38–126)
Anion gap: 8 (ref 5–15)
BUN: 7 mg/dL (ref 6–20)
CO2: 23 mmol/L (ref 22–32)
Calcium: 8.8 mg/dL — ABNORMAL LOW (ref 8.9–10.3)
Chloride: 103 mmol/L (ref 98–111)
Creatinine, Ser: 0.73 mg/dL (ref 0.44–1.00)
GFR, Estimated: >60 mL/min (ref >60)
Glucose, Bld: 125 mg/dL — ABNORMAL HIGH (ref 70–99)
Potassium: 3.5 mmol/L (ref 3.5–5.1)
Sodium: 134 mmol/L — ABNORMAL LOW (ref 135–145)
Total Bilirubin: 0.1 mg/dL — ABNORMAL LOW (ref 0.3–1.2)
Total Protein: 6.8 g/dL (ref 6.5–8.1)

## 2021-06-03 MED ORDER — SODIUM CHLORIDE 0.9 % IV SOLN
Freq: Once | INTRAVENOUS | Status: AC
  Filled 2021-06-03: qty 250

## 2021-06-03 MED ORDER — SODIUM CHLORIDE 0.9 % IV SOLN
80.0000 mg/m2 | Freq: Once | INTRAVENOUS | Status: AC
  Administered 2021-06-03: 132 mg via INTRAVENOUS
  Filled 2021-06-03: qty 22

## 2021-06-03 MED ORDER — DIPHENHYDRAMINE HCL 50 MG/ML IJ SOLN
25.0000 mg | Freq: Once | INTRAMUSCULAR | Status: AC
  Administered 2021-06-03: 25 mg via INTRAVENOUS
  Filled 2021-06-03: qty 1

## 2021-06-03 MED ORDER — SODIUM CHLORIDE 0.9 % IV SOLN
10.0000 mg | Freq: Once | INTRAVENOUS | Status: AC
  Administered 2021-06-03: 10 mg via INTRAVENOUS
  Filled 2021-06-03: qty 10

## 2021-06-03 MED ORDER — HEPARIN SOD (PORK) LOCK FLUSH 100 UNIT/ML IV SOLN
INTRAVENOUS | Status: AC
  Filled 2021-06-03: qty 5

## 2021-06-03 MED ORDER — FAMOTIDINE IN NACL 20-0.9 MG/50ML-% IV SOLN
20.0000 mg | Freq: Once | INTRAVENOUS | Status: AC
  Administered 2021-06-03: 20 mg via INTRAVENOUS
  Filled 2021-06-03: qty 50

## 2021-06-03 NOTE — Progress Notes (Signed)
[place] today ?[facility] [place]  ?[phone]) [phone] [phone] ? ?ID: [name] OB: Dec 08, 1975  MR#: [id number]  KPT#:[id number] ? ?Patient Care Team: ?Patient, [name] as PCP - General (General Practice) ?Rico Junker, RN as Registered Nurse ?Theodore Demark, RN as Registered Nurse ? 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 ? ?INTERVAL HISTORY: Patient returns to clinic today for further evaluation and consideration of cycle 12 of 12 of weekly Taxol.  She continues to have a peripheral neuropathy, but states it is not as bad as previous.  She otherwise feels well and is tolerating her treatments.  She does not complain of insomnia today.  She has no other neurologic complaints.  She denies any recent fevers or illnesses.  She has a good appetite and denies weight loss.  She has no chest pain, shortness of breath, cough, or hemoptysis.  She denies any nausea, vomiting, constipation, or diarrhea.  She has no urinary complaints.  Patient offers no further specific complaints today. ? ?REVIEW OF SYSTEMS:   ?Review of Systems  ?Constitutional: Negative.  Negative for chills, fever and malaise/fatigue.  ?Respiratory: Negative.  Negative for cough, hemoptysis and shortness of breath.   ?Cardiovascular: Negative.  Negative for chest pain and leg swelling.  ?Gastrointestinal: Negative.  Negative for abdominal pain and nausea.  ?Genitourinary: Negative.  Negative for dysuria.  ?Musculoskeletal: Negative.  Negative for back pain.  ?Skin: Negative.  Negative for rash.  ?Neurological:  Positive for sensory change. Negative for dizziness, focal weakness, weakness and headaches.  ?Psychiatric/Behavioral:  The patient is not nervous/anxious and does not have insomnia.   ? ?As per HPI. Otherwise, a complete review of systems is negative. ? ?PAST MEDICAL HISTORY: ?Past Medical History:  ?Diagnosis Date  ? Anxiety   ? Headache   ? Hypertension   ? Noncompliance    ? ? ?PAST SURGICAL HISTORY: ?Past Surgical History:  ?Procedure Laterality Date  ? BREAST BIOPSY Right 12/24/2020  ? mass, 9:00 8 cmfn venus marker, path pending  ? BREAST BIOPSY Left 12/24/2020  ? mass 3:00 retroareolar, ribbon, path pending  ? BREAST BIOPSY Left 12/24/2020  ? mass 3:00 5cmfn, heart marker, path pending  ? BREAST BIOPSY Left 12/24/2020  ? axilla LN, hyrdomarker shape 3 coil, path pending  ? PORTACATH PLACEMENT Right 01/01/2021  ? Procedure: INSERTION PORT-A-CATH;  Surgeon: Ronny Bacon, MD;  Location: [facility] ORS;  Service: General;  Laterality: Right;  ? TUBAL LIGATION    ? ? ?FAMILY HISTORY: ?Family History  ?Problem Relation Age of Onset  ? COPD Mother   ? Hypertension Mother   ? Heart disease Father   ? ? ?ADVANCED DIRECTIVES (Y/N):  N ? ?HEALTH MAINTENANCE: ?Social History  ? ?Tobacco Use  ? Smoking status: Every Day  ?  Packs/day: 1.50  ?  Types: Cigarettes  ? Smokeless tobacco: Never  ?Vaping Use  ? Vaping Use: Never used  ?Substance Use Topics  ? Alcohol use: Yes  ?  Alcohol/week: 14.0 standard drinks  ?  Types: 14 Cans of beer per week  ?  Comment: at least 2 beers daily  ? Drug use: No  ? ? ? Colonoscopy: ? PAP: ? Bone density: ? Lipid panel: ? ?Allergies  ?Allergen Reactions  ? Codeine Itching and Nausea And Vomiting  ? ? ?Current Outpatient Medications  ?Medication Sig Dispense Refill  ? acetaminophen (TYLENOL) 325 MG tablet Take 650 mg by mouth every 6 (six) hours as needed.    ?  ALPRAZolam (XANAX) 0.25 MG tablet Take 1 tablet (0.25 mg total) by mouth at bedtime. 30 tablet 2  ? bacitracin ointment Apply 1 application topically 2 (two) times daily. 120 g 0  ? diphenhydrAMINE HCl (BENADRYL ALLERGY PO) Take by mouth.    ? diphenhydrAMINE-zinc acetate (BENADRYL EXTRA STRENGTH) cream Apply 1 application topically 3 (three) times daily as needed for itching. 28.4 g 0  ? ibuprofen (ADVIL) 800 MG tablet Take 1 tablet (800 mg total) by mouth every 8 (eight) hours as needed. 30 tablet 0  ?  labetalol (NORMODYNE) 100 MG tablet Take 1 tablet (100 mg total) by mouth 2 (two) times daily. 60 tablet 1  ? lidocaine-prilocaine (EMLA) cream Apply to affected area once 30 g 3  ? loratadine (CLARITIN) 10 MG tablet Take 10 mg by mouth daily.    ? ondansetron (ZOFRAN) 8 MG tablet Take 1 tablet (8 mg total) by mouth 2 (two) times daily. 60 tablet 2  ? prednisoLONE (ORAPRED) 15 MG/5ML solution Take by mouth.    ? prochlorperazine (COMPAZINE) 10 MG tablet Take 1 tablet (10 mg total) by mouth every 6 (six) hours as needed for nausea or vomiting. 60 tablet 2  ? ?No current facility-administered medications for this visit.  ? ? ?OBJECTIVE: ?Vitals:  ? 06/03/21 0853  ?BP: (!) 153/110  ?Pulse: 99  ?Resp: 16  ?Temp: (!) 97.2 ?F (36.2 ?C)  ?SpO2: 100%  ?   Body mass index is 25.61 kg/m?Marland Kitchen    ECOG FS:0 - Asymptomatic ? ?General: Well-developed, well-nourished, no acute distress. ?Eyes: Pink conjunctiva, anicteric sclera. ?HEENT: Normocephalic, moist mucous membranes. ?Lungs: No audible wheezing or coughing. ?Heart: Regular rate and rhythm. ?Abdomen: Soft, nontender, no obvious distention. ?Musculoskeletal: No edema, cyanosis, or clubbing. ?Neuro: Alert, answering all questions appropriately. Cranial nerves grossly intact. ?Skin: No rashes or petechiae noted. ?Psych: Normal affect. ? ?LAB RESULTS: ? ?Lab Results  ?Component Value Date  ? NA 134 (L) 06/03/2021  ? K 3.5 06/03/2021  ? CL 103 06/03/2021  ? CO2 23 06/03/2021  ? GLUCOSE 125 (H) 06/03/2021  ? BUN 7 06/03/2021  ? CREATININE 0.73 06/03/2021  ? CALCIUM 8.8 (L) 06/03/2021  ? PROT 6.8 06/03/2021  ? ALBUMIN 3.5 06/03/2021  ? AST 21 06/03/2021  ? ALT 12 06/03/2021  ? ALKPHOS 68 06/03/2021  ? BILITOT 0.1 (L) 06/03/2021  ? GFRNONAA >60 06/03/2021  ? GFRAA >60 12/01/2017  ? ? ?Lab Results  ?Component Value Date  ? WBC 3.6 (L) 06/03/2021  ? NEUTROABS 1.7 06/03/2021  ? HGB 13.1 06/03/2021  ? HCT 39.8 06/03/2021  ? MCV 104.5 (H) 06/03/2021  ? PLT 290 06/03/2021   ? ? ? ?STUDIES: ?No results found. ? 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 ? ?PLAN:   ? ?1.  Bilateral breast cancer: Case discussed with pathology and this appears to be 2 distinct breast cancer primaries in her right and left breast both of which are ER/PR, HER-2 negative malignancies.  CT scan completed on December 30, 2020 reviewed independently and reported as above did not reveal any metastatic disease.  MUGA scan on January 08, 2021 reported an EF of 57.3%.  Patient has also had port placement.  Previously, surgery recommended upfront systemic therapy. Given patient's age, she will also require genetic testing.  Plan to give neoadjuvant Adriamycin plus Cytoxan every 2 weeks with Neulasta support for 4 cycles followed by weekly Taxol for 12 cycles.  Patient also may benefit from adjuvant Xeloda.  Patient completed cycle 4  of Adriamycin and Cytoxan on March 05, 2021.  Proceed with her 12th and final cycle of weekly Taxol today.  Patient has an appointment with surgery next week.  No follow-up has been scheduled at this time, but patient will follow-up approximately 2 weeks after her surgery to discuss her final pathology results and additional treatment planning.   ?2.  Nausea: Resolved.  Continue Compazine as needed. ?3.  Anxiety/insomnia: Improved.  She was previously instructed that she can increase her alprazolam to 0.25 mg to 2 tabs as needed.   ?4.  Pain: Patient does not complain of this today.  Continue ibuprofen sparingly as needed. ?5.  Hypokalemia: Resolved. ?6.  Hypertension: Chronic and unchanged.  Proceed with treatment as above. ?7.  Hyponatremia: Chronic and unchanged. ?8.  Peripheral neuropathy: Patient reports this is mildly improved.  Monitor. ?9.  Leukopenia: Mild, monitor. ? ?Patient expressed understanding and was in agreement with this plan. She also understands that She can call clinic at any time with any questions, concerns, or complaints.  ? ? 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

## 2021-06-03 NOTE — Patient Instructions (Signed)
[facility]  Discharge Instructions: Thank you for choosing [facility] to provide your oncology and hematology care.  If you have a lab appointment with the Cancer Center, please go directly to the Cancer Center and check in at the registration area.  Wear comfortable clothing and clothing appropriate for easy access to any Portacath or PICC line.   We strive to give you quality time with your provider. You may need to reschedule your appointment if you arrive late (15 or more minutes).  Arriving late affects you and other patients whose appointments are after yours.  Also, if you miss three or more appointments without notifying the office, you may be dismissed from the clinic at the provider's discretion.      For prescription refill requests, have your pharmacy contact our office and allow 72 hours for refills to be completed.    Today you received the following chemotherapy and/or immunotherapy agents Taxol      To help prevent nausea and vomiting after your treatment, we encourage you to take your nausea medication as directed.  BELOW ARE SYMPTOMS THAT SHOULD BE REPORTED IMMEDIATELY: *FEVER GREATER THAN 100.4 F (38 C) OR HIGHER *CHILLS OR SWEATING *NAUSEA AND VOMITING THAT IS NOT CONTROLLED WITH YOUR NAUSEA MEDICATION *UNUSUAL SHORTNESS OF BREATH *UNUSUAL BRUISING OR BLEEDING *URINARY PROBLEMS (pain or burning when urinating, or frequent urination) *BOWEL PROBLEMS (unusual diarrhea, constipation, pain near the anus) TENDERNESS IN MOUTH AND THROAT WITH OR WITHOUT PRESENCE OF ULCERS (sore throat, sores in mouth, or a toothache) UNUSUAL RASH, SWELLING OR PAIN  UNUSUAL VAGINAL DISCHARGE OR ITCHING   Items with * indicate a potential emergency and should be followed up as soon as possible or go to the Emergency Department if any problems should occur.  Please show the CHEMOTHERAPY ALERT CARD or IMMUNOTHERAPY ALERT CARD at check-in to the  Emergency Department and triage nurse.  Should you have questions after your visit or need to cancel or reschedule your appointment, please contact [facility]  [phone] and follow the prompts.  Office hours are 8:00 a.m. to 4:30 p.m. Monday - Friday. Please note that voicemails left after 4:00 p.m. may not be returned until the following business day.  We are closed weekends and major holidays. You have access to a nurse at all times for urgent questions. Please call the main number to the clinic [phone] and follow the prompts.  For any non-urgent questions, you may also contact your provider using MyChart. We now offer e-Visits for anyone 18 and older to request care online for non-urgent symptoms. For details visit [email or web address].   Also download the MyChart app! Go to the app store, search "MyChart", open the app, select [facility], and log in with your MyChart username and password.  Due to Covid, a mask is required upon entering the hospital/clinic. If you do not have a mask, one will be given to you upon arrival. For doctor visits, patients may have 1 support person aged 18 or older with them. For treatment visits, patients cannot have anyone with them due to current Covid guidelines and our immunocompromised population.  

## 2021-06-08 ENCOUNTER — Ambulatory Visit (INDEPENDENT_AMBULATORY_CARE_PROVIDER_SITE_OTHER): Payer: Medicaid Other | Admitting: Surgery

## 2021-06-08 ENCOUNTER — Encounter: Payer: Self-pay | Admitting: Surgery

## 2021-06-08 VITALS — BP 151/102 | HR 98 | Temp 98.0°F | Ht 66.0 in | Wt 147.2 lb

## 2021-06-08 DIAGNOSIS — C50412 Malignant neoplasm of upper-outer quadrant of left female breast: Secondary | ICD-10-CM | POA: Diagnosis not present

## 2021-06-08 DIAGNOSIS — C50411 Malignant neoplasm of upper-outer quadrant of right female breast: Secondary | ICD-10-CM

## 2021-06-08 DIAGNOSIS — C50911 Malignant neoplasm of unspecified site of right female breast: Secondary | ICD-10-CM

## 2021-06-08 DIAGNOSIS — C50912 Malignant neoplasm of unspecified site of left female breast: Secondary | ICD-10-CM

## 2021-06-08 NOTE — H&P (View-Only) (Signed)
Patient ID: Briana Soto, female   DOB: 06-Jul-1975, 46 y.o.   MRN: [id number] ? ?Chief Complaint: Bilateral breast cancer ? ?History of Present Illness ?Briana Soto presents today having completed her neoadjuvant chemotherapy, anticipating proceeding with definitive surgical treatment of her bilateral breast cancer.  She had a rather large mass on her left breast with notable positivity by biopsy, with an irregular appearance of 4 lymph nodes on ultrasound with tumors that were palpable.  She also had a subcentimeter right breast lesion that was also biopsied positive for similar malignancy. ? ?Previously: ?Briana Soto is a 46 y.o. female with recently diagnosed bilateral breast cancer, from ultrasound-guided core biopsies.  Left axillary lymphadenopathy with biopsy-proven left breast cancer, prognostic indicators pending.  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  Is been noted on previous imaging, and severe hypertension has seemed to overshadow further work-up.  She reports that undergoing menses at the age of 63, reporting 6 pregnancies, the first was terminated.  She never breast-fed for living female children.  She has had no recent nipple discharge.  She has had skin changes and breast pain.  Large mass is lateral and posterior to the known retroareolar mass which was a chronic abscess/cyst.  ?She recently underwent ultrasound exams which also showed a right sided breast mass of about 1 cm in size suspicious features noted.  She has had a longstanding dermal cyst in the right axilla. ? ?Past Medical History ?Past Medical History:  ?Diagnosis Date  ? Anxiety   ? Headache   ? Hypertension   ? Noncompliance   ?  ? ? ?Past Surgical History:  ?Procedure Laterality Date  ? BREAST BIOPSY Right 12/24/2020  ? mass, 9:00 8 cmfn venus marker, path pending  ? BREAST BIOPSY Left 12/24/2020  ? mass 3:00 retroareolar, ribbon, path pending  ? BREAST BIOPSY Left  12/24/2020  ? mass 3:00 5cmfn, heart marker, path pending  ? BREAST BIOPSY Left 12/24/2020  ? axilla LN, hyrdomarker shape 3 coil, path pending  ? PORTACATH PLACEMENT Right 01/01/2021  ? Procedure: INSERTION PORT-A-CATH;  Surgeon: Ronny Bacon, MD;  Location: [facility] ORS;  Service: General;  Laterality: Right;  ? TUBAL LIGATION    ? ? ?Allergies  ?Allergen Reactions  ? Codeine Itching and Nausea And Vomiting  ? ? ?Current Outpatient Medications  ?Medication Sig Dispense Refill  ? acetaminophen (TYLENOL) 325 MG tablet Take 650 mg by mouth every 6 (six) hours as needed.    ? ALPRAZolam (XANAX) 0.25 MG tablet Take 1 tablet (0.25 mg total) by mouth at bedtime. 30 tablet 2  ? bacitracin ointment Apply 1 application topically 2 (two) times daily. 120 g 0  ? diphenhydrAMINE HCl (BENADRYL ALLERGY PO) Take by mouth.    ? diphenhydrAMINE-zinc acetate (BENADRYL EXTRA STRENGTH) cream Apply 1 application topically 3 (three) times daily as needed for itching. 28.4 g 0  ? ibuprofen (ADVIL) 800 MG tablet Take 1 tablet (800 mg total) by mouth every 8 (eight) hours as needed. 30 tablet 0  ? labetalol (NORMODYNE) 100 MG tablet Take 1 tablet (100 mg total) by mouth 2 (two) times daily. 60 tablet 1  ? lidocaine-prilocaine (EMLA) cream Apply to affected area once 30 g 3  ? loratadine (CLARITIN) 10 MG tablet Take 10 mg by mouth daily.    ? ondansetron (ZOFRAN) 8 MG tablet Take 1 tablet (8 mg total) by mouth 2 (two) times daily. 60 tablet 2  ?  prednisoLONE (ORAPRED) 15 MG/5ML solution Take by mouth.    ? prochlorperazine (COMPAZINE) 10 MG tablet Take 1 tablet (10 mg total) by mouth every 6 (six) hours as needed for nausea or vomiting. 60 tablet 2  ? ?No current facility-administered medications for this visit.  ? ? ?Family History ?Family History  ?Problem Relation Age of Onset  ? COPD Mother   ? Hypertension Mother   ? Heart disease Father   ?  ? ? ?Social History ?Social History  ? ?Tobacco Use  ? Smoking status: Every Day  ?   Packs/day: 1.50  ?  Types: Cigarettes  ? Smokeless tobacco: Never  ?Vaping Use  ? Vaping Use: Never used  ?Substance Use Topics  ? Alcohol use: Yes  ?  Alcohol/week: 14.0 standard drinks  ?  Types: 14 Cans of beer per week  ?  Comment: at least 2 beers daily  ? Drug use: No  ?  ?  ? ? ?ROS ?Constitutional: Negative.  Negative for chills, fever and malaise/fatigue.  ?Respiratory: Negative.  Negative for cough, hemoptysis and shortness of breath.   ?Cardiovascular: Negative.  Negative for chest pain and leg swelling.  ?Gastrointestinal: Negative.  Negative for abdominal pain and nausea.  ?Genitourinary: Negative.  Negative for dysuria.  ?Musculoskeletal: Negative.  Negative for back pain.  ?Skin: Negative.  Negative for rash.  ?Neurological:  Positive for sensory change. Negative for dizziness, focal weakness, weakness and headaches.  ?Psychiatric/Behavioral:  The patient is not nervous/anxious and does not have insomnia.   ? ?Physical Exam ?Blood pressure (!) 151/102, pulse 98, temperature 98 ?F (36.7 ?C), temperature source Oral, height '5\' 6"'$  (1.676 m), weight 147 lb 3.2 oz (66.8 kg), SpO2 100 %. ?Last Weight  Most recent update: 06/08/2021  9:40 AM  ? ? Weight  ?66.8 kg (147 lb 3.2 oz)  ?      ? ?  ? ? ?CONSTITUTIONAL: Well developed, and nourished, appropriately responsive and aware without distress.  Alopecia. ?EYES: Sclera non-icteric.   ?EARS, NOSE, MOUTH AND THROAT: Mask worn.    Hearing is intact to voice.  ?NECK: Trachea is midline, and there is no jugular venous distension.  ?LYMPH NODES:  Lymph nodes in the neck are not enlarged. ?RESPIRATORY:  Lungs are clear, and breath sounds are equal bilaterally. Normal respiratory effort without pathologic use of accessory muscles. ?CARDIOVASCULAR: Heart is regular in rate and rhythm. ?GI: The abdomen is  soft, nontender, and nondistended.  ?GU: Levada Dy is present as chaperone: No appreciable dominant mass or nodularity appreciated on the right.  Right axilla is  notable for a large dermal cyst anteriorly. ?Left breast nipple areolar complex is deformed from the known chronic abscess/cystic process.  Or lateral posterior to this is a vaguely defined yet identifiable mass much less than the previous measured 5 cm.  The prior axillary masses are difficult to appreciate today.  ?MUSCULOSKELETAL:  Symmetrical muscle tone appreciated in all four extremities.    ?SKIN: Skin turgor is normal. No pathologic skin lesions appreciated.  ?NEUROLOGIC:  Motor and sensation appear grossly normal.  Cranial nerves are grossly without defect. ?PSYCH:  Alert and oriented to person, place and time. Affect is appropriate for situation. ? ?Data Reviewed ?I have personally reviewed what is currently available of the patient's imaging, recent labs and medical records.   ?Labs:  ? ?  Latest Ref Rng & Units 06/03/2021  ?  8:36 AM 05/27/2021  ?  8:46 AM 05/20/2021  ?  8:26 AM  ?  CBC  ?WBC 4.0 - 10.5 K/uL 3.6   6.6   6.7    ?Hemoglobin 12.0 - 15.0 g/dL 13.1   12.6   12.6    ?Hematocrit 36.0 - 46.0 % 39.8   38.0   38.0    ?Platelets 150 - 400 K/uL 290   278   218    ? ? ?  Latest Ref Rng & Units 06/03/2021  ?  8:36 AM 05/27/2021  ?  8:46 AM 05/20/2021  ?  8:26 AM  ?CMP  ?Glucose 70 - 99 mg/dL 125   147   105    ?BUN 6 - 20 mg/dL 7   7   <5    ?Creatinine 0.44 - 1.00 mg/dL 0.73   0.81   0.58    ?Sodium 135 - 145 mmol/L 134   133   136    ?Potassium 3.5 - 5.1 mmol/L 3.5   3.5   3.4    ?Chloride 98 - 111 mmol/L 103   101   104    ?CO2 22 - 32 mmol/L '23   24   23    '$ ?Calcium 8.9 - 10.3 mg/dL 8.8   8.9   8.8    ?Total Protein 6.5 - 8.1 g/dL 6.8   7.0   6.9    ?Total Bilirubin 0.3 - 1.2 mg/dL 0.1   0.4   0.4    ?Alkaline Phos 38 - 126 U/L 68   76   66    ?AST 15 - 41 U/L '21   19   20    '$ ?ALT 0 - 44 U/L '12   11   12    '$ ? ?SURGICAL PATHOLOGY  ?CASE: [phone]  ?PATIENT: Aleigh Welty  ?Surgical Pathology Report  ? ?Specimen Submitted:  ?A. Breast, left, retroareloar, ribbon  ?B. Breast, left, 5 CMFN, heart  ?C.  Lymph node, left axilla  ?D. Breast, right, venus  ? ?Clinical History: Site 1:  Left breast mass 3:00 retro areolar (ribbon).  ?Site 2:  Left breast mass 3:00 5 CMFN (heart).  Site 3:  left axilla  ?(hydro mark).  Sit

## 2021-06-08 NOTE — Patient Instructions (Addendum)
We have spoken today about breast surgery involving mastectomy of your breast. Your Mastectomy will be scheduled at [facility] with Dr. Christian Mate. ? ?You will most likely spend at least 1 night in the hospital following surgery and go home with 2-4 drains for approximately 5-7 days following your surgery. Please keep an accurate record of your drain amount in ml's or cc's. If your drain suddenly stops draining or has drainage around the tube at the skin, call our office and speak with a nurse immediately. ? ?Please see the (blue) pre-care surgery sheet that you have been given today for more information regarding your surgery. ? ?Information regarding your surgery has been provided below. If you have any questions or concerns, please call our office and speak with a nurse. ? ? ?Total or Modified Radical Mastectomy ?A total mastectomy and a modified radical mastectomy are types of surgery for breast cancer. ?If you are having a total mastectomy (simple mastectomy), your entire breast will be removed. If you are having a modified radical mastectomy, your breast and nipple will be removed along with the lymph nodes under your arm. You may also have some of the lining over the muscle tissues under your breast removed. ?LET [facility] CARE PROVIDER KNOW ABOUT: ?Any allergies you have. ?All medicines you are taking, including vitamins, herbs, eye drops, creams, and over-the-counter medicines. ?Previous problems you or members of your family have had with the use of anesthetics. ?Any blood disorders you have. ?Previous surgeries you have had. ?Medical conditions you have. ?RISKS AND COMPLICATIONS ?Generally, this is a safe procedure. However, problems may occur, including: ?Pain. ?Infection. ?Bleeding. ?Scar tissue. ?Chest numbness on the side of the surgery. ?Fluid buildup under the skin flaps where your breast was removed (seroma). ?Sensation of throbbing or tingling. ?Stress or sadness from losing your breast. ?If you have  the lymph nodes under your arm removed, you may have arm swelling, weakness, or numbness on the same side of your body as your surgery. ?BEFORE THE PROCEDURE ?Ask your health care provider about: ?Changing or stopping your regular medicines. This is especially important if you are taking diabetes medicines or blood thinners. ?Taking medicines such as aspirin and ibuprofen. These medicines can thin your blood. Do not take these medicines before your procedure if your health care provider instructs you not to. ?Follow your health care provider's instructions about eating or drinking restrictions. ?Plan to have someone take you home after the procedure. ?PROCEDURE ?An IV tube will be inserted into one of your veins. ?You will be given a medicine that makes you fall asleep (general anesthetic). ?Your breast will be cleaned with a germ-killing solution (antiseptic). ?A wide incision will be made around your nipple. The skin and nipple inside the incision will be removed along with all breast tissue. ?If you are having a modified radical mastectomy: ?The lining over your chest muscles will be removed. ?The incision may be extended to reach the lymph nodes under your arm, or a second incision may be made. ?The lymph nodes will be removed. ?You may have a drainage tube inserted into your incision to collect fluid that builds up after surgery. This tube is connected to a suction bulb. ?Your incision or incisions will be closed with stitches (sutures). ?A bandage (dressing) will be placed over your breast and under your arm. ?The procedure may vary among health care providers and hospitals. ?AFTER THE PROCEDURE ?You will be moved to a recovery area. ?Your blood pressure, heart rate, breathing rate,  and blood oxygen level will be monitored often until the medicines you were given have worn off. ?You will be given pain medicine as needed. ?After a while, you will be taken to a hospital room. ?You will be encouraged to get up  and walk as soon as you can. ?Your IV tube can be removed when you are able to eat and drink. ?Your drain may be removed before you go home from the hospital, or you may be sent home with your drain and suction bulb. ?  ?This information is not intended to replace advice given to you by your health care provider. Make sure you discuss any questions you have with your health care provider. ?  ?Document Released: 11/02/2000 Document Revised: 02/28/2014 Document Reviewed: 10/23/2013 ?Elsevier Interactive Patient Education ?2016 [place]. ? ? ?We have spoken today about removing a lump in your breast. This will be done by Dr. Christian Mate at [facility]. ? ?You will most likely be able to leave the hospital several hours after your surgery. Rarely, a patient needs to stay over night but this is a possibility. ? ?Plan to tenatively be off work for 1-2 weeks following the surgery and may return with approximately 4 more weeks of a lifting restriction, no greater than 15 lbs. ? ? ? ?Lumpectomy ?A lumpectomy is a form of "breast conserving" or "breast preservation" surgery. It may also be referred to as a partial mastectomy. During a lumpectomy, the portion of the breast that contains the cancerous tumor or breast mass (the lump) is removed. Some normal tissue around the lump may also be removed to make sure all of the tumor has been removed.  ?LET [facility] CARE PROVIDER KNOW ABOUT: ?Any allergies you have. ?All medicines you are taking, including vitamins, herbs, eye drops, creams, and over-the-counter medicines. ?Previous problems you or members of your family have had with the use of anesthetics. ?Any blood disorders you have. ?Previous surgeries you have had. ?Medical conditions you have. ?RISKS AND COMPLICATIONS ?Generally, this is a safe procedure. However, problems can occur and include: ?Bleeding. ?Infection. ?Pain. ?Temporary swelling. ?Change in the shape of the breast, particularly if a large portion is  removed. ?BEFORE THE PROCEDURE ?Ask your health care provider about changing or stopping your regular medicines. This is especially important if you are taking diabetes medicines or blood thinners. ?Do not eat or drink anything after midnight on the night before the procedure or as directed by your health care provider. Ask your health care provider if you can take a sip of water with any approved medicines. ?On the day of surgery, your health care provider will use a mammogram or ultrasound to locate and mark the tumor in your breast. These markings on your breast will show where the cut (incision) will be made. ?PROCEDURE  ?An IV tube will be put into one of your veins. ?You may be given medicine to help you relax before the surgery (sedative). You will be given one of the following: ?A medicine that numbs the area (local anesthetic). ?A medicine that makes you fall asleep (general anesthetic). ?Your health care provider will use a kind of electric scalpel that uses heat to minimize bleeding (electrocautery knife). ?A curved incision (like a smile or frown) that follows the natural curve of your breast is made, to allow for minimal scarring and better healing. ?The tumor will be removed with some of the surrounding tissue. This will be sent to the lab for analysis. Your health care provider  may also remove your lymph nodes at this time if needed. ?Sometimes, but not always, a rubber tube called a drain will be surgically inserted into your breast area or armpit to collect excess fluid that may accumulate in the space where the tumor was. This drain is connected to a plastic bulb on the outside of your body. This drain creates suction to help remove the fluid. ?The incisions will be closed with stitches (sutures). ?A bandage may be placed over the incisions. ?AFTER THE PROCEDURE ?You will be taken to the recovery area. ?You will be given medicine for pain. ?A small rubber drain may be placed in the breast for 2-3  days to prevent a collection of blood (hematoma) from developing in the breast. You will be given instructions on caring for the drain before you go home. ?A pressure bandage (dressing) will be applied for 1-2 days t

## 2021-06-08 NOTE — Progress Notes (Signed)
Patient ID: Briana Soto, female   DOB: November 14, 1975, 46 y.o.   MRN: [id number] ? ?Chief Complaint: Bilateral breast cancer ? ?History of Present Illness ?Briana Soto presents today having completed her neoadjuvant chemotherapy, anticipating proceeding with definitive surgical treatment of her bilateral breast cancer.  She had a rather large mass on her left breast with notable positivity by biopsy, with an irregular appearance of 4 lymph nodes on ultrasound with tumors that were palpable.  She also had a subcentimeter right breast lesion that was also biopsied positive for similar malignancy. ? ?Previously: ?Briana Soto is a 46 y.o. female with recently diagnosed bilateral breast cancer, from ultrasound-guided core biopsies.  Left axillary lymphadenopathy with biopsy-proven left breast cancer, prognostic indicators pending.  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  Is been noted on previous imaging, and severe hypertension has seemed to overshadow further work-up.  She reports that undergoing menses at the age of 28, reporting 6 pregnancies, the first was terminated.  She never breast-fed for living female children.  She has had no recent nipple discharge.  She has had skin changes and breast pain.  Large mass is lateral and posterior to the known retroareolar mass which was a chronic abscess/cyst.  ?She recently underwent ultrasound exams which also showed a right sided breast mass of about 1 cm in size suspicious features noted.  She has had a longstanding dermal cyst in the right axilla. ? ?Past Medical History ?Past Medical History:  ?Diagnosis Date  ? Anxiety   ? Headache   ? Hypertension   ? Noncompliance   ?  ? ? ?Past Surgical History:  ?Procedure Laterality Date  ? BREAST BIOPSY Right 12/24/2020  ? mass, 9:00 8 cmfn venus marker, path pending  ? BREAST BIOPSY Left 12/24/2020  ? mass 3:00 retroareolar, ribbon, path pending  ? BREAST BIOPSY Left  12/24/2020  ? mass 3:00 5cmfn, heart marker, path pending  ? BREAST BIOPSY Left 12/24/2020  ? axilla LN, hyrdomarker shape 3 coil, path pending  ? PORTACATH PLACEMENT Right 01/01/2021  ? Procedure: INSERTION PORT-A-CATH;  Surgeon: Ronny Bacon, MD;  Location: [facility] ORS;  Service: General;  Laterality: Right;  ? TUBAL LIGATION    ? ? ?Allergies  ?Allergen Reactions  ? Codeine Itching and Nausea And Vomiting  ? ? ?Current Outpatient Medications  ?Medication Sig Dispense Refill  ? acetaminophen (TYLENOL) 325 MG tablet Take 650 mg by mouth every 6 (six) hours as needed.    ? ALPRAZolam (XANAX) 0.25 MG tablet Take 1 tablet (0.25 mg total) by mouth at bedtime. 30 tablet 2  ? bacitracin ointment Apply 1 application topically 2 (two) times daily. 120 g 0  ? diphenhydrAMINE HCl (BENADRYL ALLERGY PO) Take by mouth.    ? diphenhydrAMINE-zinc acetate (BENADRYL EXTRA STRENGTH) cream Apply 1 application topically 3 (three) times daily as needed for itching. 28.4 g 0  ? ibuprofen (ADVIL) 800 MG tablet Take 1 tablet (800 mg total) by mouth every 8 (eight) hours as needed. 30 tablet 0  ? labetalol (NORMODYNE) 100 MG tablet Take 1 tablet (100 mg total) by mouth 2 (two) times daily. 60 tablet 1  ? lidocaine-prilocaine (EMLA) cream Apply to affected area once 30 g 3  ? loratadine (CLARITIN) 10 MG tablet Take 10 mg by mouth daily.    ? ondansetron (ZOFRAN) 8 MG tablet Take 1 tablet (8 mg total) by mouth 2 (two) times daily. 60 tablet 2  ?  prednisoLONE (ORAPRED) 15 MG/5ML solution Take by mouth.    ? prochlorperazine (COMPAZINE) 10 MG tablet Take 1 tablet (10 mg total) by mouth every 6 (six) hours as needed for nausea or vomiting. 60 tablet 2  ? ?No current facility-administered medications for this visit.  ? ? ?Family History ?Family History  ?Problem Relation Age of Onset  ? COPD Mother   ? Hypertension Mother   ? Heart disease Father   ?  ? ? ?Social History ?Social History  ? ?Tobacco Use  ? Smoking status: Every Day  ?   Packs/day: 1.50  ?  Types: Cigarettes  ? Smokeless tobacco: Never  ?Vaping Use  ? Vaping Use: Never used  ?Substance Use Topics  ? Alcohol use: Yes  ?  Alcohol/week: 14.0 standard drinks  ?  Types: 14 Cans of beer per week  ?  Comment: at least 2 beers daily  ? Drug use: No  ?  ?  ? ? ?ROS ?Constitutional: Negative.  Negative for chills, fever and malaise/fatigue.  ?Respiratory: Negative.  Negative for cough, hemoptysis and shortness of breath.   ?Cardiovascular: Negative.  Negative for chest pain and leg swelling.  ?Gastrointestinal: Negative.  Negative for abdominal pain and nausea.  ?Genitourinary: Negative.  Negative for dysuria.  ?Musculoskeletal: Negative.  Negative for back pain.  ?Skin: Negative.  Negative for rash.  ?Neurological:  Positive for sensory change. Negative for dizziness, focal weakness, weakness and headaches.  ?Psychiatric/Behavioral:  The patient is not nervous/anxious and does not have insomnia.   ? ?Physical Exam ?Blood pressure (!) 151/102, pulse 98, temperature 98 ?F (36.7 ?C), temperature source Oral, height '5\' 6"'$  (1.676 m), weight 147 lb 3.2 oz (66.8 kg), SpO2 100 %. ?Last Weight  Most recent update: 06/08/2021  9:40 AM  ? ? Weight  ?66.8 kg (147 lb 3.2 oz)  ?      ? ?  ? ? ?CONSTITUTIONAL: Well developed, and nourished, appropriately responsive and aware without distress.  Alopecia. ?EYES: Sclera non-icteric.   ?EARS, NOSE, MOUTH AND THROAT: Mask worn.    Hearing is intact to voice.  ?NECK: Trachea is midline, and there is no jugular venous distension.  ?LYMPH NODES:  Lymph nodes in the neck are not enlarged. ?RESPIRATORY:  Lungs are clear, and breath sounds are equal bilaterally. Normal respiratory effort without pathologic use of accessory muscles. ?CARDIOVASCULAR: Heart is regular in rate and rhythm. ?GI: The abdomen is  soft, nontender, and nondistended.  ?GU: Levada Dy is present as chaperone: No appreciable dominant mass or nodularity appreciated on the right.  Right axilla is  notable for a large dermal cyst anteriorly. ?Left breast nipple areolar complex is deformed from the known chronic abscess/cystic process.  Or lateral posterior to this is a vaguely defined yet identifiable mass much less than the previous measured 5 cm.  The prior axillary masses are difficult to appreciate today.  ?MUSCULOSKELETAL:  Symmetrical muscle tone appreciated in all four extremities.    ?SKIN: Skin turgor is normal. No pathologic skin lesions appreciated.  ?NEUROLOGIC:  Motor and sensation appear grossly normal.  Cranial nerves are grossly without defect. ?PSYCH:  Alert and oriented to person, place and time. Affect is appropriate for situation. ? ?Data Reviewed ?I have personally reviewed what is currently available of the patient's imaging, recent labs and medical records.   ?Labs:  ? ?  Latest Ref Rng & Units 06/03/2021  ?  8:36 AM 05/27/2021  ?  8:46 AM 05/20/2021  ?  8:26 AM  ?  CBC  ?WBC 4.0 - 10.5 K/uL 3.6   6.6   6.7    ?Hemoglobin 12.0 - 15.0 g/dL 13.1   12.6   12.6    ?Hematocrit 36.0 - 46.0 % 39.8   38.0   38.0    ?Platelets 150 - 400 K/uL 290   278   218    ? ? ?  Latest Ref Rng & Units 06/03/2021  ?  8:36 AM 05/27/2021  ?  8:46 AM 05/20/2021  ?  8:26 AM  ?CMP  ?Glucose 70 - 99 mg/dL 125   147   105    ?BUN 6 - 20 mg/dL 7   7   <5    ?Creatinine 0.44 - 1.00 mg/dL 0.73   0.81   0.58    ?Sodium 135 - 145 mmol/L 134   133   136    ?Potassium 3.5 - 5.1 mmol/L 3.5   3.5   3.4    ?Chloride 98 - 111 mmol/L 103   101   104    ?CO2 22 - 32 mmol/L '23   24   23    '$ ?Calcium 8.9 - 10.3 mg/dL 8.8   8.9   8.8    ?Total Protein 6.5 - 8.1 g/dL 6.8   7.0   6.9    ?Total Bilirubin 0.3 - 1.2 mg/dL 0.1   0.4   0.4    ?Alkaline Phos 38 - 126 U/L 68   76   66    ?AST 15 - 41 U/L '21   19   20    '$ ?ALT 0 - 44 U/L '12   11   12    '$ ? ?SURGICAL PATHOLOGY  ?CASE: [id number]  ?PATIENT: Briana Soto  ?Surgical Pathology Report  ? ?Specimen Submitted:  ?A. Breast, left, retroareloar, ribbon  ?B. Breast, left, 5 CMFN, heart  ?C.  Lymph node, left axilla  ?D. Breast, right, venus  ? ?Clinical History: Site 1:  Left breast mass 3:00 retro areolar (ribbon).  ?Site 2:  Left breast mass 3:00 5 CMFN (heart).  Site 3:  left axilla  ?(hydro mark).  Sit

## 2021-06-10 ENCOUNTER — Inpatient Hospital Stay: Payer: Medicaid Other

## 2021-06-10 ENCOUNTER — Other Ambulatory Visit: Payer: Self-pay

## 2021-06-10 ENCOUNTER — Other Ambulatory Visit: Payer: Self-pay | Admitting: Surgery

## 2021-06-10 ENCOUNTER — Inpatient Hospital Stay: Payer: Medicaid Other | Admitting: Oncology

## 2021-06-10 DIAGNOSIS — R928 Other abnormal and inconclusive findings on diagnostic imaging of breast: Secondary | ICD-10-CM

## 2021-06-10 DIAGNOSIS — C50911 Malignant neoplasm of unspecified site of right female breast: Secondary | ICD-10-CM

## 2021-06-11 ENCOUNTER — Telehealth: Payer: Self-pay | Admitting: Surgery

## 2021-06-11 NOTE — Telephone Encounter (Signed)
Called the patient, we got disconnected, tried calling back but not able to get through, having connectivity issues.   ?If patient calls back, please inform her of the following regarding her scheduled surgery:  ? ?Pre-Admission date/time, COVID Testing date and Surgery date. ? ?Surgery Date: 07/05/21, patient needs to arrive at 7:30 am this day as she will be having SLN bx injection done prior to surgery.  So arrive @ 7:30 am at [facility].   ? ?Preadmission Testing Date: 06/28/21 (phone 1p-5p) ?Covid Testing Date: Not needed.   ? ?Also remind patient of her RF tag to be placed at the [facility] on 06/24/21.   ?  ?

## 2021-06-14 NOTE — Telephone Encounter (Signed)
Patient calls back, she is now informed of all dates regarding her surgery and verbalized understanding.   

## 2021-06-14 NOTE — Telephone Encounter (Signed)
Tried calling patient again today, voice mailbox is not set up.  Need to inform of the following information regarding her scheduled surgery.  ? ?

## 2021-06-24 ENCOUNTER — Ambulatory Visit
Admission: RE | Admit: 2021-06-24 | Discharge: 2021-06-24 | Disposition: A | Discharge status: Home / Self Care | Payer: Medicaid Other | Source: Ambulatory Visit | Attending: Surgery | Admitting: Surgery

## 2021-06-24 DIAGNOSIS — R928 Other abnormal and inconclusive findings on diagnostic imaging of breast: Secondary | ICD-10-CM | POA: Insufficient documentation

## 2021-06-24 HISTORY — PX: BREAST LUMPECTOMY WITH RADIOFREQUENCY TAG IDENTIFICATION: SHX6884

## 2021-06-28 ENCOUNTER — Encounter
Admission: RE | Admit: 2021-06-28 | Discharge: 2021-06-28 | Disposition: A | Discharge status: Home / Self Care | Payer: Medicaid Other | Source: Ambulatory Visit | Attending: Surgery | Admitting: Surgery

## 2021-06-28 ENCOUNTER — Ambulatory Visit: Payer: Self-pay | Admitting: Surgery

## 2021-06-28 ENCOUNTER — Other Ambulatory Visit: Payer: Self-pay

## 2021-06-28 DIAGNOSIS — C50911 Malignant neoplasm of unspecified site of right female breast: Secondary | ICD-10-CM

## 2021-06-28 DIAGNOSIS — C50411 Malignant neoplasm of upper-outer quadrant of right female breast: Secondary | ICD-10-CM

## 2021-06-28 DIAGNOSIS — I10 Essential (primary) hypertension: Secondary | ICD-10-CM

## 2021-06-28 NOTE — Patient Instructions (Addendum)
Your procedure is scheduled on: 07/05/2021 ?Report to the Registration Desk on the 1st floor of the [facility]. ?To find out your arrival time, please call [phone] between 1PM - 3PM on: 07/02/2021 ?If your arrival time is 6:00 am, do not arrive prior to that time as the [facility] entrance doors do not open until 6:00 am. ? ?REMEMBER: ?Instructions that are not followed completely may result in serious medical risk, up to and including death; or upon the discretion of your surgeon and anesthesiologist your surgery may need to be rescheduled. ? ?Do not eat food after midnight the night before surgery.  ?No gum chewing, lozengers or hard candies. ? ? ? ?TAKE THESE MEDICATIONS THE MORNING OF SURGERY WITH A SIP OF WATER: ?Labetalol ?Loratadine ?xanax ? ? ? ?One week prior to surgery: ?Stop Anti-inflammatories (NSAIDS) such as Advil, Aleve, Ibuprofen, Motrin, Naproxen, Naprosyn and Aspirin based products such as Excedrin, Goodys Powder, BC Powder. ?Stop ANY OVER THE COUNTER supplements until after surgery. ?You may however, continue to take Tylenol if needed for pain up until the day of surgery. ? ?No Alcohol for 24 hours before or after surgery. ? ?No Smoking including e-cigarettes for 24 hours prior to surgery.  ?No chewable tobacco products for at least 6 hours prior to surgery.  ?No nicotine patches on the day of surgery. ? ?Do not use any "recreational" drugs for at least a week prior to your surgery.  ?Please be advised that the combination of cocaine and anesthesia may have negative outcomes, up to and including death. ?If you test positive for cocaine, your surgery will be cancelled. ? ?On the morning of surgery brush your teeth with toothpaste and water, you may rinse your mouth with mouthwash if you wish. ?Do not swallow any toothpaste or mouthwash. ? ?Use CHG Soap with shower as directed on instruction sheet. ? ?Do not wear jewelry, make-up, hairpins, clips or nail polish. ? ?Do not wear lotions,  powders,creams, or perfumes.  ? ?Do not shave body from the neck down 48 hours prior to surgery just in case you cut yourself which could leave a site for infection.  ?Also, freshly shaved skin may become irritated if using the CHG soap. ? ?Contact lenses, hearing aids and dentures may not be worn into surgery. ? ?Do not bring valuables to the hospital. [facility] is not responsible for any missing/lost belongings or valuables.  ? ? ?Notify your doctor if there is any change in your medical condition (cold, fever, infection). ? ?Wear comfortable clothing (specific to your surgery type) to the hospital. ? ?After surgery, you can help prevent lung complications by doing breathing exercises.  ?Take deep breaths and cough every 1-2 hours. Your doctor may order a device called an Incentive Spirometer to help you take deep breaths. ? ?If you are being admitted to the hospital overnight, leave your suitcase in the car. ?After surgery it may be brought to your room. ? ?If you are being discharged the day of surgery, you will not be allowed to drive home. ?You will need a responsible adult (18 years or older) to drive you home and stay with you that night.  ? ?If you are taking public transportation, you will need to have a responsible adult (18 years or older) with you. ?Please confirm with your physician that it is acceptable to use public transportation.  ? ?Please call the [facility] at [phone] if you have any questions about these instructions. ? ?Surgery  Visitation Policy: ? ?Patients undergoing a surgery or procedure may have two family members or support persons with them as long as the person is not COVID-19 positive or experiencing its symptoms.  ? ?Inpatient Visitation:   ? ?Visiting hours are 7 a.m. to 8 p.m. ?Up to four visitors are allowed at one time in a patient room, including children. The visitors may rotate out with other people during the day. One designated support person  (adult) may remain overnight.  ?

## 2021-06-29 ENCOUNTER — Encounter
Admission: RE | Admit: 2021-06-29 | Discharge: 2021-06-29 | Disposition: A | Discharge status: Home / Self Care | Payer: Medicaid Other | Source: Ambulatory Visit | Attending: Surgery | Admitting: Surgery

## 2021-06-29 DIAGNOSIS — I10 Essential (primary) hypertension: Secondary | ICD-10-CM | POA: Insufficient documentation

## 2021-06-29 DIAGNOSIS — Z01810 Encounter for preprocedural cardiovascular examination: Secondary | ICD-10-CM | POA: Insufficient documentation

## 2021-07-03 NOTE — Anesthesia Preprocedure Evaluation (Addendum)
Anesthesia Evaluation  ?Patient identified by MRN, date of birth, ID band ?Patient awake ? ? ? ?Reviewed: ?Allergy & Precautions, H&P , NPO status , Patient's Chart, lab work & pertinent test results ? ?History of Anesthesia Complications ?Negative for: history of anesthetic complications ? ?Airway ?Mallampati: II ? ?TM Distance: >3 FB ? ? ? ? Dental ? ?(+) Chipped,  ?  ?Pulmonary ?neg sleep apnea, neg COPD, Current SmokerPatient did not abstain from smoking.,  ?  ?Pulmonary exam normal ? ? ? ? ? ? ? Cardiovascular ?hypertension, Pt. on home beta blockers ?(-) angina(-) Past MI and (-) Cardiac Stents Normal cardiovascular exam(-) dysrhythmias  ? ?Prolonged QT EKG 06/29/21 ?  ?Neuro/Psych ? Headaches, PSYCHIATRIC DISORDERS Anxiety Peripheral neuropathy ?  ? GI/Hepatic ?negative GI ROS, (+)  ?  ? substance abuse ? alcohol use,   ?Endo/Other  ?negative endocrine ROS ? Renal/GU ?negative Renal ROS  ?negative genitourinary ?  ?Musculoskeletal ? ? Abdominal ?Normal abdominal exam  (+)   ?Peds ? Hematology ?negative hematology ROS ?(+)   ?Anesthesia Other Findings ?invasive ductal carcinoma bilateral breast ? ?Past Medical History: ?No date: Anxiety ?No date: Headache ?No date: Hypertension ?No date: Noncompliance ?Past Surgical History: ?12/24/2020: BREAST BIOPSY; Right ?    Comment:  mass, 9:00 8 cmfn venus marker, path pending ?12/24/2020: BREAST BIOPSY; Left ?    Comment:  mass 3:00 retroareolar, ribbon, path pending ?12/24/2020: BREAST BIOPSY; Left ?    Comment:  mass 3:00 5cmfn, heart marker, path pending ?12/24/2020: BREAST BIOPSY; Left ?    Comment:  axilla LN, hyrdomarker shape 3 coil, path pending ?No date: TUBAL LIGATION ? ?BMI   ? Body Mass Index: 21.65 kg/m?  ?  ? ? Reproductive/Obstetrics ?negative OB ROS ? ?  ? ? ? ? ? ? ? ? ? ? ? ? ? ?  ?  ? ? ? ? ? ? ?Anesthesia Physical ? ?Anesthesia Plan ? ?ASA: 3 ? ?Anesthesia Plan: General  ? ?Post-op Pain Management: Celebrex PO  (pre-op)*, Gabapentin PO (pre-op)* and Tylenol PO (pre-op)*  ? ?Induction: Intravenous ? ?PONV Risk Score and Plan: 3 and Ondansetron, Dexamethasone, Midazolam and Propofol infusion ? ?Airway Management Planned: Oral ETT ? ?Additional Equipment:  ? ?Intra-op Plan:  ? ?Post-operative Plan: Extubation in OR ? ?Informed Consent: I have reviewed the patients History and Physical, chart, labs and discussed the procedure including the risks, benefits and alternatives for the proposed anesthesia with the patient or authorized representative who has indicated his/her understanding and acceptance.  ? ? ? ?Dental advisory given ? ?Plan Discussed with: Anesthesiologist, CRNA and Surgeon ? ?Anesthesia Plan Comments:   ? ? ? ? ? ?Anesthesia Quick Evaluation ? ?

## 2021-07-05 ENCOUNTER — Other Ambulatory Visit: Payer: Self-pay

## 2021-07-05 ENCOUNTER — Ambulatory Visit
Admission: RE | Admit: 2021-07-05 | Discharge: 2021-07-05 | Disposition: A | Discharge status: Home / Self Care | Payer: Medicaid Other | Source: Ambulatory Visit | Attending: Surgery | Admitting: Surgery

## 2021-07-05 ENCOUNTER — Observation Stay
Admission: RE | Admit: 2021-07-05 | Discharge: 2021-07-06 | Disposition: A | Discharge status: Home / Self Care | Payer: Medicaid Other | Source: Ambulatory Visit | Attending: Surgery | Admitting: Surgery

## 2021-07-05 ENCOUNTER — Encounter: Payer: Self-pay | Admitting: Surgery

## 2021-07-05 ENCOUNTER — Encounter
Admission: RE | Disposition: A | Discharge status: Home / Self Care | Payer: Self-pay | Source: Ambulatory Visit | Attending: Surgery

## 2021-07-05 ENCOUNTER — Ambulatory Visit: Payer: Medicaid Other | Admitting: Anesthesiology

## 2021-07-05 ENCOUNTER — Ambulatory Visit: Payer: Medicaid Other

## 2021-07-05 ENCOUNTER — Encounter
Admission: RE | Admit: 2021-07-05 | Discharge: 2021-07-05 | Disposition: A | Discharge status: Home / Self Care | Payer: Medicaid Other | Source: Ambulatory Visit | Attending: Surgery | Admitting: Surgery

## 2021-07-05 DIAGNOSIS — C50911 Malignant neoplasm of unspecified site of right female breast: Secondary | ICD-10-CM | POA: Diagnosis present

## 2021-07-05 DIAGNOSIS — C773 Secondary and unspecified malignant neoplasm of axilla and upper limb lymph nodes: Secondary | ICD-10-CM | POA: Diagnosis not present

## 2021-07-05 DIAGNOSIS — R928 Other abnormal and inconclusive findings on diagnostic imaging of breast: Secondary | ICD-10-CM

## 2021-07-05 DIAGNOSIS — C50411 Malignant neoplasm of upper-outer quadrant of right female breast: Secondary | ICD-10-CM | POA: Diagnosis not present

## 2021-07-05 DIAGNOSIS — C50912 Malignant neoplasm of unspecified site of left female breast: Secondary | ICD-10-CM | POA: Diagnosis not present

## 2021-07-05 DIAGNOSIS — C50412 Malignant neoplasm of upper-outer quadrant of left female breast: Secondary | ICD-10-CM

## 2021-07-05 DIAGNOSIS — I10 Essential (primary) hypertension: Secondary | ICD-10-CM | POA: Diagnosis not present

## 2021-07-05 DIAGNOSIS — Z170 Estrogen receptor positive status [ER+]: Secondary | ICD-10-CM

## 2021-07-05 DIAGNOSIS — N6002 Solitary cyst of left breast: Secondary | ICD-10-CM | POA: Diagnosis not present

## 2021-07-05 DIAGNOSIS — F17210 Nicotine dependence, cigarettes, uncomplicated: Secondary | ICD-10-CM | POA: Insufficient documentation

## 2021-07-05 DIAGNOSIS — Z79899 Other long term (current) drug therapy: Secondary | ICD-10-CM | POA: Insufficient documentation

## 2021-07-05 HISTORY — PX: MASTECTOMY MODIFIED RADICAL: SHX5962

## 2021-07-05 HISTORY — PX: BREAST LUMPECTOMY,RADIO FREQ LOCALIZER,AXILLARY SENTINEL LYMPH NODE BIOPSY: SHX6900

## 2021-07-05 LAB — POCT PREGNANCY, URINE: Pregnancy Test, Urine: NEGATIVE

## 2021-07-05 SURGERY — BREAST LUMPECTOMY,RADIO FREQ LOCALIZER,AXILLARY SENTINEL LYMPH NODE BIOPSY
Anesthesia: General | Site: Breast | Laterality: Right

## 2021-07-05 MED ORDER — LACTATED RINGERS IV SOLN: INTRAVENOUS | Status: DC

## 2021-07-05 MED ORDER — GABAPENTIN 300 MG PO CAPS: 300.0000 mg | ORAL_CAPSULE | ORAL | Status: AC

## 2021-07-05 MED ORDER — FAMOTIDINE 20 MG PO TABS: 20.0000 mg | ORAL_TABLET | Freq: Once | ORAL | Status: AC

## 2021-07-05 MED ORDER — SODIUM CHLORIDE 0.9 % IV SOLN: INTRAVENOUS | Status: DC

## 2021-07-05 MED ORDER — ONDANSETRON HCL 4 MG/2ML IJ SOLN
4.0000 mg | Freq: Four times a day (QID) | INTRAMUSCULAR | Status: DC | PRN
  Administered 2021-07-05 – 2021-07-06 (×2): 4 mg via INTRAVENOUS
  Filled 2021-07-05: qty 2

## 2021-07-05 MED ORDER — LIDOCAINE HCL (CARDIAC) PF 100 MG/5ML IV SOSY
PREFILLED_SYRINGE | INTRAVENOUS | Status: DC | PRN
  Administered 2021-07-05: 80 mg via INTRAVENOUS

## 2021-07-05 MED ORDER — ONDANSETRON HCL 4 MG PO TABS
8.0000 mg | ORAL_TABLET | Freq: Two times a day (BID) | ORAL | Status: DC
  Administered 2021-07-06: 8 mg via ORAL
  Filled 2021-07-05 (×2): qty 2

## 2021-07-05 MED ORDER — ORAL CARE MOUTH RINSE: 15.0000 mL | Freq: Once | OROMUCOSAL | Status: AC

## 2021-07-05 MED ORDER — CEFAZOLIN SODIUM-DEXTROSE 2-4 GM/100ML-% IV SOLN
INTRAVENOUS | Status: AC
  Filled 2021-07-05: qty 100

## 2021-07-05 MED ORDER — GABAPENTIN 300 MG PO CAPS
ORAL_CAPSULE | ORAL | Status: AC
  Administered 2021-07-05: 300 mg via ORAL
  Filled 2021-07-05: qty 1

## 2021-07-05 MED ORDER — PHENYLEPHRINE HCL (PRESSORS) 10 MG/ML IV SOLN
INTRAVENOUS | Status: AC
  Filled 2021-07-05: qty 1

## 2021-07-05 MED ORDER — ZOLPIDEM TARTRATE 5 MG PO TABS: 5.0000 mg | ORAL_TABLET | Freq: Every evening | ORAL | Status: DC | PRN

## 2021-07-05 MED ORDER — CELECOXIB 200 MG PO CAPS
ORAL_CAPSULE | ORAL | Status: AC
  Administered 2021-07-05: 200 mg via ORAL
  Filled 2021-07-05: qty 1

## 2021-07-05 MED ORDER — ROCURONIUM BROMIDE 100 MG/10ML IV SOLN
INTRAVENOUS | Status: DC | PRN
  Administered 2021-07-05: 20 mg via INTRAVENOUS

## 2021-07-05 MED ORDER — DROPERIDOL 2.5 MG/ML IJ SOLN: 0.6250 mg | Freq: Once | INTRAMUSCULAR | Status: DC | PRN

## 2021-07-05 MED ORDER — FENTANYL CITRATE (PF) 100 MCG/2ML IJ SOLN
INTRAMUSCULAR | Status: DC | PRN
Start: 2021-07-05 — End: 2021-07-05
  Administered 2021-07-05 (×3): 50 ug via INTRAVENOUS

## 2021-07-05 MED ORDER — FENTANYL CITRATE (PF) 100 MCG/2ML IJ SOLN
INTRAMUSCULAR | Status: AC
  Filled 2021-07-05: qty 2

## 2021-07-05 MED ORDER — ACETAMINOPHEN 10 MG/ML IV SOLN: 1000.0000 mg | Freq: Once | INTRAVENOUS | Status: DC | PRN

## 2021-07-05 MED ORDER — ISOSULFAN BLUE 1 % ~~LOC~~ SOLN
SUBCUTANEOUS | Status: AC
  Filled 2021-07-05: qty 5

## 2021-07-05 MED ORDER — ACETAMINOPHEN 500 MG PO TABS: 1000.0000 mg | ORAL_TABLET | ORAL | Status: AC

## 2021-07-05 MED ORDER — PHENYLEPHRINE 80 MCG/ML (10ML) SYRINGE FOR IV PUSH (FOR BLOOD PRESSURE SUPPORT)
PREFILLED_SYRINGE | INTRAVENOUS | Status: AC
  Filled 2021-07-05: qty 10

## 2021-07-05 MED ORDER — TECHNETIUM TC 99M TILMANOCEPT KIT
1.0000 | PACK | Freq: Once | INTRAVENOUS | Status: AC | PRN
  Administered 2021-07-05: 1.1 via INTRADERMAL

## 2021-07-05 MED ORDER — ACETAMINOPHEN 500 MG PO TABS
ORAL_TABLET | ORAL | Status: AC
  Administered 2021-07-05: 1000 mg via ORAL
  Filled 2021-07-05: qty 2

## 2021-07-05 MED ORDER — ALPRAZOLAM 0.25 MG PO TABS
0.2500 mg | ORAL_TABLET | Freq: Every day | ORAL | Status: DC
  Administered 2021-07-05: 0.25 mg via ORAL
  Filled 2021-07-05: qty 1

## 2021-07-05 MED ORDER — FAMOTIDINE 20 MG PO TABS
ORAL_TABLET | ORAL | Status: AC
  Administered 2021-07-05: 20 mg via ORAL
  Filled 2021-07-05: qty 1

## 2021-07-05 MED ORDER — DEXAMETHASONE SODIUM PHOSPHATE 10 MG/ML IJ SOLN
INTRAMUSCULAR | Status: AC
  Filled 2021-07-05: qty 1

## 2021-07-05 MED ORDER — BUPIVACAINE LIPOSOME 1.3 % IJ SUSP
INTRAMUSCULAR | Status: AC
  Filled 2021-07-05: qty 20

## 2021-07-05 MED ORDER — PROPOFOL 10 MG/ML IV BOLUS
INTRAVENOUS | Status: AC
  Filled 2021-07-05: qty 20

## 2021-07-05 MED ORDER — HYDRALAZINE HCL 20 MG/ML IJ SOLN
10.0000 mg | INTRAMUSCULAR | Status: DC | PRN
  Administered 2021-07-05 – 2021-07-06 (×2): 10 mg via INTRAVENOUS
  Filled 2021-07-05 (×2): qty 1

## 2021-07-05 MED ORDER — BUPIVACAINE LIPOSOME 1.3 % IJ SUSP: 20.0000 mL | Freq: Once | INTRAMUSCULAR | Status: DC

## 2021-07-05 MED ORDER — MIDAZOLAM HCL 2 MG/2ML IJ SOLN
INTRAMUSCULAR | Status: DC | PRN
  Administered 2021-07-05: 2 mg via INTRAVENOUS

## 2021-07-05 MED ORDER — PROCHLORPERAZINE MALEATE 10 MG PO TABS
10.0000 mg | ORAL_TABLET | Freq: Four times a day (QID) | ORAL | Status: DC | PRN
  Administered 2021-07-05: 10 mg via ORAL
  Filled 2021-07-05 (×3): qty 1

## 2021-07-05 MED ORDER — OXYCODONE HCL 5 MG/5ML PO SOLN: 5.0000 mg | Freq: Once | ORAL | Status: AC | PRN

## 2021-07-05 MED ORDER — CELECOXIB 200 MG PO CAPS: 200.0000 mg | ORAL_CAPSULE | ORAL | Status: AC

## 2021-07-05 MED ORDER — STERILE WATER FOR IRRIGATION IR SOLN
Status: DC | PRN
  Administered 2021-07-05: 500 mL

## 2021-07-05 MED ORDER — OXYCODONE-ACETAMINOPHEN 5-325 MG PO TABS: 1.0000 | ORAL_TABLET | ORAL | Status: DC | PRN

## 2021-07-05 MED ORDER — SODIUM BICARBONATE 8.4 % IV SOLN
INTRAVENOUS | Status: AC
  Filled 2021-07-05: qty 50

## 2021-07-05 MED ORDER — BUPIVACAINE HCL (PF) 0.25 % IJ SOLN
INTRAMUSCULAR | Status: AC
  Filled 2021-07-05: qty 30

## 2021-07-05 MED ORDER — ONDANSETRON HCL 4 MG/2ML IJ SOLN
INTRAMUSCULAR | Status: AC
  Filled 2021-07-05: qty 2

## 2021-07-05 MED ORDER — ONDANSETRON HCL 4 MG/2ML IJ SOLN
INTRAMUSCULAR | Status: DC | PRN
  Administered 2021-07-05: 4 mg via INTRAVENOUS

## 2021-07-05 MED ORDER — MIDAZOLAM HCL 2 MG/2ML IJ SOLN
INTRAMUSCULAR | Status: AC
  Filled 2021-07-05: qty 2

## 2021-07-05 MED ORDER — PHENYLEPHRINE HCL (PRESSORS) 10 MG/ML IV SOLN
INTRAVENOUS | Status: DC | PRN
  Administered 2021-07-05 (×2): 80 ug via INTRAVENOUS

## 2021-07-05 MED ORDER — CEFAZOLIN SODIUM-DEXTROSE 2-4 GM/100ML-% IV SOLN
2.0000 g | INTRAVENOUS | Status: AC
  Administered 2021-07-05: 2 g via INTRAVENOUS

## 2021-07-05 MED ORDER — BUPIVACAINE-EPINEPHRINE (PF) 0.25% -1:200000 IJ SOLN
INTRAMUSCULAR | Status: DC | PRN
  Administered 2021-07-05: 18 mL

## 2021-07-05 MED ORDER — CHLORHEXIDINE GLUCONATE 0.12 % MT SOLN
OROMUCOSAL | Status: AC
  Administered 2021-07-05: 15 mL via OROMUCOSAL
  Filled 2021-07-05: qty 15

## 2021-07-05 MED ORDER — CHLORHEXIDINE GLUCONATE 0.12 % MT SOLN: 15.0000 mL | Freq: Once | OROMUCOSAL | Status: AC

## 2021-07-05 MED ORDER — ONDANSETRON 4 MG PO TBDP: 4.0000 mg | ORAL_TABLET | Freq: Four times a day (QID) | ORAL | Status: DC | PRN

## 2021-07-05 MED ORDER — LIDOCAINE HCL (PF) 2 % IJ SOLN
INTRAMUSCULAR | Status: AC
  Filled 2021-07-05: qty 5

## 2021-07-05 MED ORDER — IPRATROPIUM-ALBUTEROL 0.5-2.5 (3) MG/3ML IN SOLN
3.0000 mL | Freq: Once | RESPIRATORY_TRACT | Status: DC
Start: 2021-07-05 — End: 2021-07-05

## 2021-07-05 MED ORDER — ROCURONIUM BROMIDE 10 MG/ML (PF) SYRINGE
PREFILLED_SYRINGE | INTRAVENOUS | Status: AC
  Filled 2021-07-05: qty 10

## 2021-07-05 MED ORDER — SUGAMMADEX SODIUM 200 MG/2ML IV SOLN
INTRAVENOUS | Status: DC | PRN
  Administered 2021-07-05: 180 mg via INTRAVENOUS

## 2021-07-05 MED ORDER — OXYCODONE HCL 5 MG PO TABS: 5.0000 mg | ORAL_TABLET | Freq: Once | ORAL | Status: AC | PRN

## 2021-07-05 MED ORDER — CHLORHEXIDINE GLUCONATE CLOTH 2 % EX PADS
6.0000 | MEDICATED_PAD | Freq: Once | CUTANEOUS | Status: AC
  Administered 2021-07-05: 6 via TOPICAL

## 2021-07-05 MED ORDER — HYDROMORPHONE HCL 1 MG/ML IJ SOLN
0.5000 mg | INTRAMUSCULAR | Status: DC | PRN
  Administered 2021-07-05 – 2021-07-06 (×2): 0.5 mg via INTRAVENOUS
  Filled 2021-07-05 (×3): qty 0.5

## 2021-07-05 MED ORDER — CEFAZOLIN SODIUM-DEXTROSE 2-4 GM/100ML-% IV SOLN
2.0000 g | Freq: Three times a day (TID) | INTRAVENOUS | Status: AC
  Administered 2021-07-05: 2 g via INTRAVENOUS
  Filled 2021-07-05: qty 100

## 2021-07-05 MED ORDER — BUPIVACAINE-EPINEPHRINE (PF) 0.25% -1:200000 IJ SOLN
INTRAMUSCULAR | Status: AC
  Filled 2021-07-05: qty 30

## 2021-07-05 MED ORDER — LORATADINE 10 MG PO TABS
10.0000 mg | ORAL_TABLET | Freq: Every day | ORAL | Status: DC
  Administered 2021-07-05 – 2021-07-06 (×2): 10 mg via ORAL
  Filled 2021-07-05 (×2): qty 1

## 2021-07-05 MED ORDER — KETAMINE HCL 50 MG/5ML IJ SOSY
PREFILLED_SYRINGE | INTRAMUSCULAR | Status: AC
  Filled 2021-07-05: qty 5

## 2021-07-05 MED ORDER — ACETAMINOPHEN 325 MG PO TABS: 650.0000 mg | ORAL_TABLET | Freq: Four times a day (QID) | ORAL | Status: DC | PRN

## 2021-07-05 MED ORDER — ISOSULFAN BLUE 1 % ~~LOC~~ SOLN
SUBCUTANEOUS | Status: DC | PRN
  Administered 2021-07-05: 5 mL via SUBCUTANEOUS

## 2021-07-05 MED ORDER — IBUPROFEN 400 MG PO TABS
800.0000 mg | ORAL_TABLET | Freq: Three times a day (TID) | ORAL | Status: DC | PRN
  Administered 2021-07-05: 800 mg via ORAL
  Filled 2021-07-05: qty 2

## 2021-07-05 MED ORDER — KETAMINE HCL 10 MG/ML IJ SOLN
INTRAMUSCULAR | Status: DC | PRN
  Administered 2021-07-05: 30 mg via INTRAVENOUS
  Administered 2021-07-05: 20 mg via INTRAVENOUS

## 2021-07-05 MED ORDER — SUCCINYLCHOLINE CHLORIDE 200 MG/10ML IV SOSY
PREFILLED_SYRINGE | INTRAVENOUS | Status: DC | PRN
  Administered 2021-07-05: 100 mg via INTRAVENOUS

## 2021-07-05 MED ORDER — DEXAMETHASONE SODIUM PHOSPHATE 10 MG/ML IJ SOLN
INTRAMUSCULAR | Status: DC | PRN
  Administered 2021-07-05: 10 mg via INTRAVENOUS

## 2021-07-05 MED ORDER — FENTANYL CITRATE (PF) 100 MCG/2ML IJ SOLN: 25.0000 ug | INTRAMUSCULAR | Status: DC | PRN

## 2021-07-05 MED ORDER — PHENYLEPHRINE HCL-NACL 20-0.9 MG/250ML-% IV SOLN
INTRAVENOUS | Status: DC | PRN
Start: 2021-07-05 — End: 2021-07-05
  Administered 2021-07-05: 25 ug/min via INTRAVENOUS

## 2021-07-05 MED ORDER — PROPOFOL 10 MG/ML IV BOLUS
INTRAVENOUS | Status: DC | PRN
Start: 2021-07-05 — End: 2021-07-05
  Administered 2021-07-05: 150 mg via INTRAVENOUS

## 2021-07-05 MED ORDER — PROMETHAZINE HCL 25 MG/ML IJ SOLN: 6.2500 mg | INTRAMUSCULAR | Status: DC | PRN

## 2021-07-05 MED ORDER — OXYCODONE HCL 5 MG PO TABS
ORAL_TABLET | ORAL | Status: AC
  Administered 2021-07-05: 5 mg via ORAL
  Filled 2021-07-05: qty 1

## 2021-07-05 MED ORDER — LABETALOL HCL 100 MG PO TABS
100.0000 mg | ORAL_TABLET | Freq: Two times a day (BID) | ORAL | Status: DC
  Administered 2021-07-05 – 2021-07-06 (×2): 100 mg via ORAL
  Filled 2021-07-05 (×2): qty 1

## 2021-07-05 SURGICAL SUPPLY — 67 items
APPLIER CLIP 9.375 SM OPEN (CLIP)
BINDER BREAST LRG (GAUZE/BANDAGES/DRESSINGS) ×1 IMPLANT
BLADE SURG 15 STRL LF DISP TIS (BLADE) ×2 IMPLANT
BLADE SURG 15 STRL SS (BLADE) ×3
BLADE SURG SZ10 CARB STEEL (BLADE) ×6 IMPLANT
BULB RESERV EVAC DRAIN JP 100C (MISCELLANEOUS) ×2 IMPLANT
CHLORAPREP W/TINT 26 (MISCELLANEOUS) ×2 IMPLANT
CLIP APPLIE 9.375 SM OPEN (CLIP) IMPLANT
CLIP VESOCCLUDE MED 6/CT (CLIP) IMPLANT
CNTNR SPEC 2.5X3XGRAD LEK (MISCELLANEOUS)
CONT SPEC 4OZ STER OR WHT (MISCELLANEOUS)
CONT SPEC 4OZ STRL OR WHT (MISCELLANEOUS)
CONTAINER SPEC 2.5X3XGRAD LEK (MISCELLANEOUS) IMPLANT
DERMABOND ADVANCED (GAUZE/BANDAGES/DRESSINGS) ×1
DERMABOND ADVANCED .7 DNX12 (GAUZE/BANDAGES/DRESSINGS) ×2 IMPLANT
DEVICE DUBIN SPECIMEN MAMMOGRA (MISCELLANEOUS) ×4 IMPLANT
DRAIN CHANNEL JP 15F RND 16 (MISCELLANEOUS) ×2 IMPLANT
DRAPE LAPAROTOMY TRNSV 106X77 (MISCELLANEOUS) ×3 IMPLANT
DRSG GAUZE FLUFF 36X18 (GAUZE/BANDAGES/DRESSINGS) ×4 IMPLANT
DRSG TELFA 3X8 NADH (GAUZE/BANDAGES/DRESSINGS) ×3 IMPLANT
ELECT CAUTERY BLADE 6.4 (BLADE) ×3 IMPLANT
ELECT CAUTERY BLADE TIP 2.5 (TIP) ×3
ELECT REM PT RETURN 9FT ADLT (ELECTROSURGICAL) ×3
ELECTRODE CAUTERY BLDE TIP 2.5 (TIP) ×2 IMPLANT
ELECTRODE REM PT RTRN 9FT ADLT (ELECTROSURGICAL) ×2 IMPLANT
GAUZE 4X4 16PLY ~~LOC~~+RFID DBL (SPONGE) ×3 IMPLANT
GAUZE SPONGE 4X4 12PLY STRL (GAUZE/BANDAGES/DRESSINGS) ×1 IMPLANT
GLOVE ORTHO TXT STRL SZ7.5 (GLOVE) ×6 IMPLANT
GOWN STRL REUS W/ TWL LRG LVL3 (GOWN DISPOSABLE) ×2 IMPLANT
GOWN STRL REUS W/ TWL XL LVL3 (GOWN DISPOSABLE) ×2 IMPLANT
GOWN STRL REUS W/TWL LRG LVL3 (GOWN DISPOSABLE) ×3
GOWN STRL REUS W/TWL XL LVL3 (GOWN DISPOSABLE) ×3
HEMOSTAT ARISTA ABSORB 3G PWDR (HEMOSTASIS) IMPLANT
KIT MARKER MARGIN INK (KITS) ×1 IMPLANT
KIT TURNOVER KIT A (KITS) ×3 IMPLANT
MANIFOLD NEPTUNE II (INSTRUMENTS) ×3 IMPLANT
NEEDLE HYPO 22GX1.5 SAFETY (NEEDLE) ×3 IMPLANT
PACK BASIN MAJOR ARMC (MISCELLANEOUS) ×3 IMPLANT
PACK BASIN MINOR ARMC (MISCELLANEOUS) ×3 IMPLANT
PAD DRESSING TELFA 3X8 NADH (GAUZE/BANDAGES/DRESSINGS) ×2 IMPLANT
SET LOCALIZER 20 PROBE US (MISCELLANEOUS) ×3 IMPLANT
SHEARS HARMONIC 9CM CVD (BLADE) ×1 IMPLANT
SLEVE PROBE SENORX GAMMA FIND (MISCELLANEOUS) ×3 IMPLANT
SPIKE FLUID TRANSFER (MISCELLANEOUS) ×3 IMPLANT
SPONGE T-LAP 18X18 ~~LOC~~+RFID (SPONGE) ×9 IMPLANT
STAPLER SKIN PROX 35W (STAPLE) ×1 IMPLANT
SUT ETHILON 3-0 FS-10 30 BLK (SUTURE) ×6
SUT MNCRL 4-0 (SUTURE) ×3
SUT MNCRL 4-0 27XMFL (SUTURE) ×2
SUT MNCRL AB 4-0 PS2 18 (SUTURE) ×6 IMPLANT
SUT SILK 2 0 (SUTURE) ×3
SUT SILK 2 0 SH (SUTURE) ×3 IMPLANT
SUT SILK 2-0 18XBRD TIE 12 (SUTURE) ×2 IMPLANT
SUT VIC AB 3-0 54X BRD REEL (SUTURE) ×2 IMPLANT
SUT VIC AB 3-0 BRD 54 (SUTURE) ×3
SUT VIC AB 3-0 SH 27 (SUTURE) ×12
SUT VIC AB 3-0 SH 27X BRD (SUTURE) ×4 IMPLANT
SUTURE EHLN 3-0 FS-10 30 BLK (SUTURE) ×2 IMPLANT
SUTURE MNCRL 4-0 27XMF (SUTURE) ×2 IMPLANT
SWABSTK COMLB BENZOIN TINCTURE (MISCELLANEOUS) IMPLANT
SYR 10ML LL (SYRINGE) ×3 IMPLANT
SYR 20ML LL LF (SYRINGE) ×3 IMPLANT
SYR BULB IRRIG 60ML STRL (SYRINGE) ×3 IMPLANT
TRAP NEPTUNE SPECIMEN COLLECT (MISCELLANEOUS) ×3 IMPLANT
TRAY FOLEY SLVR 16FR LF STAT (SET/KITS/TRAYS/PACK) ×1 IMPLANT
WATER STERILE IRR 1000ML POUR (IV SOLUTION) ×3 IMPLANT
WATER STERILE IRR 500ML POUR (IV SOLUTION) ×3 IMPLANT

## 2021-07-05 NOTE — Progress Notes (Signed)
Pt noted to have expiratory wheezing. Dr. Wynetta Emery notified. Acknowledged. Orders received. ?

## 2021-07-05 NOTE — Interval H&P Note (Signed)
History and Physical Interval Note: ? ?07/05/2021 ?9:09 AM ? ?BERYLE BAGSBY  has presented today for surgery, with the diagnosis of invasive ductal carcinoma bilateral breast.  The various methods of treatment have been discussed with the patient and family. After consideration of risks, benefits and other options for treatment, the patient has consented to  Procedure(s): ?BREAST LUMPECTOMY,RADIO FREQ LOCALIZER,AXILLARY SENTINEL LYMPH NODE BIOPSY (Right) ?MASTECTOMY MODIFIED RADICAL (Left) as a surgical intervention.  The patient's history has been reviewed, patient examined, no change in status, stable for surgery.  I have reviewed the patient's chart and labs.  Questions were answered to the patient's satisfaction.   ? ? ?Ronny Bacon ? ? ?

## 2021-07-05 NOTE — Anesthesia Procedure Notes (Signed)
Procedure Name: Intubation ?Date/Time: 07/05/2021 9:37 AM ?Performed by: Fredderick Phenix, CRNA ?Pre-anesthesia Checklist: Patient identified, Emergency Drugs available, Suction available and Patient being monitored ?Patient Re-evaluated:Patient Re-evaluated prior to induction ?Oxygen Delivery Method: Circle system utilized ?Preoxygenation: Pre-oxygenation with 100% oxygen ?Induction Type: IV induction ?Ventilation: Mask ventilation without difficulty ?Laryngoscope Size: McGraph and 4 ?Grade View: Grade I ?Tube type: Oral ?Tube size: 7.0 mm ?Number of attempts: 1 ?Airway Equipment and Method: Stylet and Oral airway ?Placement Confirmation: ETT inserted through vocal cords under direct vision, positive ETCO2 and breath sounds checked- equal and bilateral ?Secured at: 21 cm ?Tube secured with: Tape ?Dental Injury: Teeth and Oropharynx as per pre-operative assessment  ? ? ? ? ?

## 2021-07-05 NOTE — Op Note (Signed)
Operative note ? ?Pre-operative Diagnosis: Breast Cancer, bilateral.   ? ?Post-operative Diagnosis: Same ? ?Surgeon: Ronny Bacon, M.D., FACS ? ?Anesthesia: General endotracheal. ? ?Procedure: 1. Lumpectomy, RFID tag directed, sentinel node biopsy RIGHT BREAST ?  2. Left modified radical mastectomy ([id number]) ? ?Procedure Details  ?The patient was seen again in the Holding Room. The benefits, complications, treatment options, and expected outcomes were discussed with the patient. The risks of bleeding, infection, recurrence of symptoms, failure to resolve symptoms, hematoma, seroma, open wound, cosmetic deformity, and the need for further surgery were discussed.  The patient was taken to Operating Room, identified as Briana Soto and the procedure verified.  A Time Out was held and the above information confirmed. ? ?Prior to the induction of general anesthesia, antibiotic prophylaxis was administered. VTE prophylaxis was in place. The patient was positioned in the supine position. Appropriate anesthesia was then administered and tolerated well. The LOCALizer is used to mark the skin for incision.  ?A visual dye Isosulfan Blue 4 ml was injected Right periareolar dermis early under aseptic conditions.  Massage was administered to this area for 5 minutes prior to securely taping it.  The chest was prepped with Chloraprep and draped in the sterile fashion.  ?Then using the hand-held probe an area of high counts was identified in the axilla, an incision was made and direction by the probe aided in dissection of lymph nodes which was sent for permanent section.  2 clusters with remarkably high counts were removed, ensuring a minimum of 4-5 lymph nodes were removed.  All blue lymph nodes at the end of lymphatics were removed, all significantly radioactive lymph nodes were removed with background counts less than 10% of the highest in vivo count. ?A well-defined right axillary dermal cyst was excised. ?Attention  was turned to the RFID tag localization site where an incision was made. Dissection using the LOCALizer to perform a lumpectomy with adequate margins was performed. This was done with electrocautery and sharp dissection with Mayo scissors. There was minimal bleeding, and the cavity packed.  The specimen was taken to the back table and painted to demarcate the 6 surfaces of potential margin.   I returned to the cavity to remove the packing, and hemostasis was confirmed with electrocautery.   Once assuring that hemostasis was adequate and checked multiple times the wound was closed with interrupted 3-0 Vicryl followed by 4-0 subcuticular Monocryl sutures. ? ?The axillary wounds were closed in a similar fashion. ?Dermabond is utilized to seal the incision.  ?  ?DETAILS OF Left sided PROCEDURE: ?Planned elliptical incision encompassing the nipple-areolar complex oriented transversely to encompass the tumor/axillary tail of the breast was likewise marked/noted. ?Elliptical skin incision encompassing the nipple-areolar complex was made, as noted above, using a #10 blade scalpel. Superior and then inferior skin flaps were separated from breast parenchyma in the avascular plane between subcutaneous tissue and breast parenchyma using electrocautery to maintain hemostasis. The specimen then excised from the underlying pectoralis major fascia from medially to laterally, after which specimen was swung laterally and a lateral pedicle where breast parenchyma became axillary fat was identified, incised, and the specimen was handed off the field for pathology processing.  ?The lateral border of the breast was painted orange.  Hemostasis was confirmed/achieved, and the mastectomy wound was copiously irrigated with warm sterile saline. ? ?The axillary package was then defined laterally by identifying the latissimus as the lateral boundary.  Medially the clavipectoral fascia was opened and dissected bluntly from the  medial chest wall,  freeing the tissues from behind the pectoralis minor.  The long thoracic nerve was identified, tested and preserved.  Dissection anteriorly and remaining inferior to the axillary vein was continued with caution utilizing the Harmonic Focus. The thoraco-dorsal neuro-vascular bundle was identified and preserved.  The intervening tissues were divided with the Harmonic Focus to the posterior limit of the subscapularis.  The contents were then taken distally preserving the above structures.  ?Hemostasis was maintained.  Irrigation with sterile water. ?Two 15 drains placed and secure with 3-0 Nylon.  The dermis was approximated with interrupted 3-0 Vicryl, and staples were applied to seal the incision.  ?Drain sponges secured.  ?Patient was then safely able to be extubated, awakened, and transferred to PACU for post-operative monitoring and care. ?  ?I was present for all aspects of the above procedure, and no operative complications were apparent.  ? ?SPECIMENS: Right axillary sentinel lymph nodes, right lateral breast tissue for lumpectomy. Left breast mastectomy and Left axillary contents as lymph node dissection. ?  ?IMPLANTS: None ?  ?DRAINS: 44 F round drain from lateral along inferior left breast flap,  and 1 into left axilla.  ?  ?COMPLICATIONS: None apparent ?  ?CONDITION AT END OF PROCEDURE: Hemodynamically stable and extubated ?  ?DISPOSITION OF PATIENT: PACU ? ?Faxitron imaging: confirming the appropriate markers in the right breast specimen, and the left axillary contents of the previously biopsied sites.  ? ?Estimated Blood Loss: 50 mL/ ?  ?        ? ?Sentinel Node Biopsy Synoptic Operative Report ? ?Operation performed with curative intent:Yes ? ?Tracer(s) used to identify sentinel nodes in the upfront surgery (non-neoadjuvant) setting (select all that apply):Dye and Radioactive Tracer ? ?Tracer(s) used to identify sentinel nodes in the neoadjuvant setting (select all that apply):Dye and Radioactive  Tracer ? ?All nodes (colored or non-colored) present at the end of a dye-filled lymphatic channel were removed:Yes  ? ?All significantly radioactive nodes were removed:Yes ? ?All palpable suspicious nodes were removed:Yes ? ?Biopsy-proven positive nodes marked with clips prior to chemotherapy were identified and removed:Yes ?  ? ?Ronny Bacon, M.D., FACS ?[facility] ? ?07/05/2021 ; 2:04 PM ? ? ? ? ? ?

## 2021-07-05 NOTE — Transfer of Care (Signed)
Immediate Anesthesia Transfer of Care Note ? ?Patient: Briana Soto ? ?Procedure(s) Performed: BREAST LUMPECTOMY,RADIO FREQ LOCALIZER,AXILLARY SENTINEL LYMPH NODE BIOPSY (Right) ?MASTECTOMY MODIFIED RADICAL (Left) ? ?Patient Location: PACU ? ?Anesthesia Type:General ? ?Level of Consciousness: awake, alert  and oriented ? ?Airway & Oxygen Therapy: Patient Spontanous Breathing ? ?Post-op Assessment: Report given to RN and Post -op Vital signs reviewed and stable ? ?Post vital signs: reviewed and stable ? ?Last Vitals:  ?Vitals Value Taken Time  ?BP 127/95 07/05/21 1349  ?Temp    ?Pulse 93 07/05/21 1357  ?Resp 14 07/05/21 1357  ?SpO2 98 % 07/05/21 1357  ?Vitals shown include unvalidated device data. ? ?Last Pain:  ?Vitals:  ? 07/05/21 0859  ?TempSrc: Temporal  ?PainSc: 0-No pain  ?   ? ?  ? ?Complications: No notable events documented. ?

## 2021-07-06 ENCOUNTER — Encounter: Payer: Self-pay | Admitting: Surgery

## 2021-07-06 DIAGNOSIS — C50911 Malignant neoplasm of unspecified site of right female breast: Secondary | ICD-10-CM | POA: Diagnosis not present

## 2021-07-06 MED ORDER — IBUPROFEN 800 MG PO TABS: 800.0000 mg | ORAL_TABLET | Freq: Three times a day (TID) | ORAL | 0 refills | Status: DC | PRN

## 2021-07-06 MED ORDER — ONDANSETRON HCL 8 MG PO TABS: 8.0000 mg | ORAL_TABLET | Freq: Two times a day (BID) | ORAL | 2 refills | Status: DC

## 2021-07-06 MED ORDER — OXYCODONE-ACETAMINOPHEN 5-325 MG PO TABS: 1.0000 | ORAL_TABLET | Freq: Four times a day (QID) | ORAL | 0 refills | Status: DC | PRN

## 2021-07-06 NOTE — TOC Initial Note (Signed)
Transition of Care (TOC) - Initial/Assessment Note  ? ? ?Patient Details  ?Name: Briana Soto ?MRN: [id number] ?Date of Birth: 12/10/1975 ? ?Transition of Care (TOC) CM/SW Contact:    ?Beverly Sessions, RN ?Phone Number: ?07/06/2021, 9:23 AM ? ?Clinical Narrative:                 ? ? ?Transition of Care (TOC) Screening Note ? ? ?Patient Details  ?Name: Briana Soto ?Date of Birth: Sep 24, 1975 ? ? ?Transition of Care (TOC) CM/SW Contact:    ?Beverly Sessions, RN ?Phone Number: ?07/06/2021, 9:23 AM ? ? ? ?Transition of Care Department [facility]) has reviewed patient and no TOC needs have been identified at this time. We will continue to monitor patient advancement through interdisciplinary progression rounds. If new patient transition needs arise, please place a TOC consult. ? ? ? ?  ?  ? ? ?Patient Goals and CMS Choice ?  ?  ?  ? ?Expected Discharge Plan and Services ?  ?  ?  ?  ?  ?Expected Discharge Date: 07/06/21               ?  ?  ?  ?  ?  ?  ?  ?  ?  ?  ? ?Prior Living Arrangements/Services ?  ?  ?  ?       ?  ?  ?  ?  ? ?Activities of Daily Living ?Home Assistive Devices/Equipment: None ?ADL Screening (condition at time of admission) ?Patient's cognitive ability adequate to safely complete daily activities?: Yes ?Is the patient deaf or have difficulty hearing?: No ?Does the patient have difficulty seeing, even when wearing glasses/contacts?: No ?Does the patient have difficulty concentrating, remembering, or making decisions?: No ?Patient able to express need for assistance with ADLs?: Yes ?Does the patient have difficulty dressing or bathing?: No ?Independently performs ADLs?: Yes (appropriate for developmental age) ?Does the patient have difficulty walking or climbing stairs?: No ?Weakness of Legs: None ?Weakness of Arms/Hands: None ? ?Permission Sought/Granted ?  ?  ?   ?   ?   ?   ? ?Emotional Assessment ?  ?  ?  ?  ?  ?  ? ?Admission diagnosis:  Bilateral breast cancer ([place]) [C50.911, C50.912] ?Patient  Active Problem List  ? Diagnosis Date Noted  ? Bilateral breast cancer ([place]) 07/05/2021  ? Genetic testing 02/23/2021  ? 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 Hypertensive urgency 11/29/2017  ? Hypokalemia 11/29/2017  ? Tachycardia 11/29/2017  ? Anxiety 09/29/2012  ? Essential hypertension 09/29/2012  ? Tobacco use 09/29/2012  ? ?PCP:  Carrolyn Meiers, MD ?Pharmacy:   ?[facility] - [place], [place] - [address] ?[address] ?[place] [address] ?Phone: [phone] Fax: [phone] ? ? ? ? ?Social Determinants of Health (SDOH) Interventions ?  ? ?Readmission Risk Interventions ?   ? View : No data to display.  ?  ?  ?  ? ? ? ?

## 2021-07-06 NOTE — Discharge Summary (Signed)
[name] ?SURGICAL DISCHARGE SUMMARY ? ?Patient ID: ?Briana Soto ?MRN: [id number] ?DOB/AGE: 47-Apr-1977 47 y.o. ? ?Admit date: 07/05/2021 ?Discharge date: 07/06/2021 ? ?Discharge Diagnoses ?Patient Active Problem List  ? Diagnosis Date Noted  ? Bilateral breast cancer ([place]) 07/05/2021  ? Genetic testing 02/23/2021  ? 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 ? ?Consultants ?None ? ?Procedures ?07/05/2021:  ?Right Breast Lumpectomy; RFID; SLNB ?Left modified radical mastectomy ? ?HPI: Briana Soto is a 46 y.o. female with a history of bilateral breast cancer with left axillary lymphadenopathy with biopsy-proven metastatic left breast cancer who presents to [facility] on 05/15 for surgery ? ?Hospital Course: Informed consent was obtained and documented, and patient underwent uneventful Right Breast Lumpectomy; RFID; SLNB and ?Left modified radical mastectomy (Dr Christian Mate, 07/05/2021).  Post-operatively, patient did reasonably well. She had some issues with nausea but reports that has been normal for her lately. Pain reasonably controlled. The remainder of patient's hospital course was essentially unremarkable, and discharge planning was initiated accordingly with patient safely able to be discharged home with appropriate discharge instructions, pain control, and outpatient follow-up after all of her and her family's questions were answered to her expressed satisfaction.  ? ?Discharge Condition: Good  ? ? ?Physical Examination:  ?Constitutional: Well appearing female, NAD ?Pulmonary: Normal effort, no respiratory distress ?Skin: Chaperone present, right axillar incisions are CDI, no erythema. Left mastectomy is CDI with staples, no erythema. Two drains present; both serosanguinous  ? ? ?Allergies as of 07/06/2021   ? ?   Reactions  ? Codeine Itching, Nausea And Vomiting  ? ?  ? ?  ?Medication List  ?  ? ?TAKE these  medications   ? ?acetaminophen 325 MG tablet ?Commonly known as: TYLENOL ?Take 650 mg by mouth every 6 (six) hours as needed. ?  ?ALPRAZolam 0.25 MG tablet ?Commonly known as: Duanne Moron ?Take 1 tablet (0.25 mg total) by mouth at bedtime. ?  ?bacitracin ointment ?Apply 1 application topically 2 (two) times daily. ?  ?BENADRYL ALLERGY PO ?Take by mouth. ?  ?diphenhydrAMINE-zinc acetate cream ?Commonly known as: Benadryl Extra Strength ?Apply 1 application topically 3 (three) times daily as needed for itching. ?  ?ibuprofen 800 MG tablet ?Commonly known as: ADVIL ?Take 1 tablet (800 mg total) by mouth every 8 (eight) hours as needed for mild pain or fever. ?What changed: reasons to take this ?  ?labetalol 100 MG tablet ?Commonly known as: NORMODYNE ?Take 1 tablet (100 mg total) by mouth 2 (two) times daily. ?  ?lidocaine-prilocaine cream ?Commonly known as: EMLA ?Apply to affected area once ?  ?loratadine 10 MG tablet ?Commonly known as: CLARITIN ?Take 10 mg by mouth daily. ?  ?ondansetron 8 MG tablet ?Commonly known as: ZOFRAN ?Take 1 tablet (8 mg total) by mouth 2 (two) times daily. ?  ?oxyCODONE-acetaminophen 5-325 MG tablet ?Commonly known as: PERCOCET/ROXICET ?Take 1 tablet by mouth every 6 (six) hours as needed for severe pain. ?  ?prednisoLONE 15 MG/5ML solution ?Commonly known as: ORAPRED ?Take by mouth. ?  ?prochlorperazine 10 MG tablet ?Commonly known as: COMPAZINE ?Take 1 tablet (10 mg total) by mouth every 6 (six) hours as needed for nausea or vomiting. ?  ? ?  ? ? ? ? Follow-up Information   ? ? Ronny Bacon, MD. Schedule an appointment as soon as possible for a visit in 1 week(s).   ?Specialty: General Surgery ?Why: left mastectomy with drains  x2, staples ?Contact information: ?[address] ?[address] ?[place] [address] ?[phone] ? ? ?  ?  ? ?  ?  ? ?  ? ? ? ?Time spent on discharge management including discussion of hospital course, clinical condition, outpatient instructions, prescriptions,  and follow up with the patient and members of the medical team: >30 minutes ? ?-- ?Edison Simon , PA-C ?[facility]  ?07/06/2021, 8:56 AM ?[phone] ?M-F: 7am - 4pm ? ?

## 2021-07-06 NOTE — Anesthesia Postprocedure Evaluation (Signed)
Anesthesia Post Note ? ?Patient: Briana Soto ? ?Procedure(s) Performed: BREAST LUMPECTOMY,RADIO FREQ LOCALIZER,AXILLARY SENTINEL LYMPH NODE BIOPSY (Right: Breast) ?MASTECTOMY MODIFIED RADICAL (Left: Breast) ? ?Patient location during evaluation: PACU ?Anesthesia Type: General ?Level of consciousness: awake and alert ?Pain management: pain level controlled ?Vital Signs Assessment: post-procedure vital signs reviewed and stable ?Respiratory status: spontaneous breathing, nonlabored ventilation and respiratory function stable ?Cardiovascular status: blood pressure returned to baseline and stable ?Postop Assessment: no apparent nausea or vomiting ?Anesthetic complications: no ? ? ?No notable events documented. ? ? ?Last Vitals:  ?Vitals:  ? 07/06/21 0611 07/06/21 0747  ?BP: (!) 171/98 (!) 168/95  ?Pulse: 95 97  ?Resp: 18 16  ?Temp: 36.5 ?C 36.7 ?C  ?SpO2: 100% 98%  ?  ?Last Pain:  ?Vitals:  ? 07/06/21 0114  ?TempSrc:   ?PainSc: 2   ? ? ?  ?  ?  ?  ?  ?  ? ?[place] Ouch ? ? ? ? ?

## 2021-07-06 NOTE — Progress Notes (Signed)
Patient pain controlled with dilaudid and Advil. B/P elevated and prn hydralazine given. Patient had several episodes of vomiting related to smells and taste (per patient).  ?

## 2021-07-06 NOTE — Discharge Instructions (Signed)
In addition to included general post-operative instructions, ? ?Diet: Resume home diet.  ? ?Activity: No heavy lifting >20 pounds (children, pets, laundry, garbage) or strenuous activity until follow-up in 2 weeks, but light activity and walking are encouraged. Do not drive or drink alcohol if taking narcotic pain medications or having pain that might distract from driving. ? ?Wound care: If you can keep drain site covered and water proofed, 2 days after surgery (05/17), you may shower/get incision wet with soapy water and pat dry (do not rub incisions), but no baths or submerging incision underwater until follow-up.  ? ?Drain: Monitor and record drain output daily; handouts given  ? ?Medications: Resume all home medications. For mild to moderate pain: acetaminophen (Tylenol) or ibuprofen/naproxen (if no kidney disease). Combining Tylenol with alcohol can substantially increase your risk of causing liver disease. Narcotic pain medications, if prescribed, can be used for severe pain, though may cause nausea, constipation, and drowsiness. Do not combine Tylenol and Percocet (or similar) within a 6 hour period as Percocet (and similar) contain(s) Tylenol. If you do not need the narcotic pain medication, you do not need to fill the prescription. ? ?Call office [phone] / [phone]) at any time if any questions, worsening pain, fevers/chills, bleeding, drainage from incision site, or other concerns. ? ?

## 2021-07-09 ENCOUNTER — Other Ambulatory Visit: Payer: Self-pay | Admitting: Anatomic Pathology & Clinical Pathology

## 2021-07-09 LAB — SURGICAL PATHOLOGY

## 2021-07-09 NOTE — Progress Notes (Signed)
dt ?

## 2021-07-13 ENCOUNTER — Encounter: Payer: Self-pay | Admitting: Surgery

## 2021-07-13 ENCOUNTER — Ambulatory Visit (INDEPENDENT_AMBULATORY_CARE_PROVIDER_SITE_OTHER): Payer: Medicaid Other | Admitting: Surgery

## 2021-07-13 VITALS — BP 157/109 | HR 86 | Temp 97.8°F | Wt 142.8 lb

## 2021-07-13 DIAGNOSIS — C50411 Malignant neoplasm of upper-outer quadrant of right female breast: Secondary | ICD-10-CM

## 2021-07-13 DIAGNOSIS — Z09 Encounter for follow-up examination after completed treatment for conditions other than malignant neoplasm: Secondary | ICD-10-CM

## 2021-07-13 DIAGNOSIS — C50412 Malignant neoplasm of upper-outer quadrant of left female breast: Secondary | ICD-10-CM

## 2021-07-13 NOTE — Progress Notes (Signed)
[facility] POST-OP OFFICE VISIT  07/13/2021  HPI: Briana Soto is a 46 y.o. female 7 days s/p left modified radical mastectomy, and right breast RFID tag lumpectomy with sentinel lymph node biopsy. She presents today apparently doing minimal in terms of upper extremity use.  Has the original gauze dressings in place.  Drains appear to be functioning and she has a good record of the drain output. Well removing her staples, I reviewed the pathology report with she and her husband.  Vital signs: BP (!) 157/109   Pulse 86   Temp 97.8 F (36.6 C) (Oral)   Wt 142 lb 12.8 oz (64.8 kg)   SpO2 96%   BMI 23.76 kg/m    Physical Exam: Constitutional: Overall she appears well. Left chest wall without evidence of seroma, hematoma or mass.  All skin flaps appear intact and viable.  I proceeded with removal of the staples and replacement of the staples with Steri-Strips over benzoin.  I proceeded with removal of the anterior drain.  New drain dressing applied.  Both drains were milked prior.  There was some minimal amount of serous fluid draining from the posterior drain after milking out the clot. Skin: Intact, right breast incision and axillary incisions are clean dry and intact with the anticipated swelling/fullness present.  No evidence of hematoma or erythema present.  Pathology report reviewed for positive anterior margins on the left, the additional subcutaneous scar tissue excised also had cancer present within it.  2 left axillary lymph nodes had cancer as well.  Assessment/Plan: This is a 46 y.o. female 7 days s/p left modified radical mastectomy, right breast lumpectomy with sentinel lymph node biopsy.   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 I discussed with her options of possible wide local excision, with plastic surgery to assist with mobilizing a flap to this area.  Other options may include postoperative radiation, as this is so close to the dermis and difficult to ensure adequate excision.  I will defer this discussion to breast cancer conference.  She will need a referral to Dr. Baruch Gouty. I will continue her axillary drain for another 48 hours to ensure that there is no increase in output after having milked it today.   Ronny Bacon M.D., FACS 07/13/2021, 9:38 AM

## 2021-07-13 NOTE — Patient Instructions (Addendum)
If you have any concerns or questions, please feel free to call our office. See follow up appointment below.   Total or Modified Radical Mastectomy A total mastectomy and a modified radical mastectomy are surgeries that are done as part of treatment for breast cancer. Both types involve removing a breast. In a total mastectomy (simple mastectomy), all breast tissue including the nipple will be removed. In a modified radical mastectomy, lymph nodes under the arm will be removed along with the breast and nipple. Some of the lining over the muscle tissues under the breast may also be removed. These procedures may also be used to help prevent breast cancer. A preventive (prophylactic) mastectomy may be done if you are at an increased risk of breast cancer due to harmful changes (mutations) in certain genes, such as the BRCA genes. In that case, the procedure involves removing both of your breasts. This can reduce your risk of developing breast cancer in the future. For a transgender person, a total mastectomy may be done as part of a surgical transition from female to female. Let your health care provider know about: Any allergies you have. All medicines you are taking, including vitamins, herbs, eye drops, creams, and over-the-counter medicines. Any problems you or family members have had with anesthetic medicines. Any bleeding problems you have. Any surgeries you have had. Any medical conditions you have. Whether you are pregnant or may be pregnant. What are the risks? Generally, this is a safe procedure. However, problems may occur, including: Infection. Bleeding. Allergic reactions to medicines. Scar tissue. Chest numbness, sensation of throbbing, or tingling on the side of the surgery. Fluid buildup under the skin flaps where your breast was removed (seroma). Stress or sadness from losing your breast. If you have the lymph nodes under your arm removed, you may have arm swelling, weakness, or  numbness on the same side of your body as your surgery. What happens before the procedure? When to stop eating and drinking Follow instructions from your health care provider about what you may eat and drink before your procedure. These may include: 8 hours before your procedure Stop eating most foods. Do not eat meat, fried foods, or fatty foods. Eat only light foods, such as toast or crackers. All liquids are okay except energy drinks and alcohol. 6 hours before your procedure Stop eating. Drink only clear liquids, such as water, clear fruit juice, black coffee, plain tea, and sports drinks. Do not drink energy drinks or alcohol. 2 hours before your procedure Stop drinking all liquids. You may be allowed to take medicines with small sips of water. If you do not follow your health care provider's instructions, your procedure may be delayed or canceled. Medicines Ask your health care provider about: Changing or stopping your regular medicines. This is especially important if you are taking diabetes medicines or blood thinners. Taking medicines such as aspirin and ibuprofen. These medicines can thin your blood. Do not take these medicines unless your health care provider tells you to take them. Taking over-the-counter medicines, vitamins, herbs, and supplements. General instructions You may be checked for extra fluid around your lymph nodes (lymphedema). Do not use any products that contain nicotine or tobacco before the procedure. These products include cigarettes, chewing tobacco, and vaping devices, such as e-cigarettes. If you need help quitting, ask your health care provider. Ask your health care provider about: How your surgery site will be marked. What steps will be taken to help prevent infection. These steps may include:  Removing hair at the surgery site. Washing skin with a germ-killing soap. Taking antibiotic medicine. What happens during the procedure? An IV will be inserted  into one of your veins. You will be given: A medicine to help you relax (sedative). A medicine to make you fall asleep (general anesthetic). A wide incision will be made around your nipple. The skin of the breast and the nipple inside the incision will be removed along with all breast tissue. Lymph nodes under the arm on the side of the tumor will be checked to see if the cancer has spread. If you are having a modified radical mastectomy: The lining over your chest muscles will be removed. The incision may be extended to reach the lymph nodes under your arm, or a second incision may be made. Lymph nodes will be removed. Breast tissue and lymph nodes that are removed will be sent to the lab for testing. You may have a drainage tube inserted into your incision to collect fluid that builds up after surgery. This tube will be connected to a suction bulb on the outside of your body to remove the fluid. Your incision or incisions will be closed with stitches (sutures), skin glue, or adhesive strips. A bandage (dressing) will be placed over your breast area. If lymph nodes were removed, a dressing will also be placed under your arm. The procedure may vary among health care providers and hospitals. What happens after the procedure? Your blood pressure, heart rate, breathing rate, and blood oxygen level will be monitored until you leave the hospital or clinic. You will be given pain medicine as needed. Your IV can be removed when you are able to eat and drink. You may have a drainage tube in place for 2-3 days to prevent a collection of blood (hematoma) from developing in the breast area. You will be given instructions about caring for the drain before you go home. A pressure bandage may be applied for 1-2 days to prevent bleeding or swelling. Ask your health care provider how to care for your pressure bandage at home. Summary In a total mastectomy (simple mastectomy), all breast tissue including the  nipple will be removed. In a modified radical mastectomy, lymph nodes under the arm will be removed along with the breast and nipple, and the chest wall lining. Before the procedure, follow instructions from your health care provider about eating and drinking, and ask about changing or stopping your regular medicines. You may have a drainage tube inserted into your incision to collect fluid that builds up after surgery. This tube will be connected to a suction bulb on the outside of your body to remove the fluid. This information is not intended to replace advice given to you by your health care provider. Make sure you discuss any questions you have with your health care provider. Document Revised: 11/08/2020 Document Reviewed: 11/08/2020 Elsevier Patient Education  [place].

## 2021-07-15 ENCOUNTER — Other Ambulatory Visit: Payer: Self-pay | Admitting: Surgery

## 2021-07-15 ENCOUNTER — Ambulatory Visit (INDEPENDENT_AMBULATORY_CARE_PROVIDER_SITE_OTHER): Payer: Medicaid Other | Admitting: Surgery

## 2021-07-15 ENCOUNTER — Encounter: Payer: Self-pay | Admitting: Surgery

## 2021-07-15 ENCOUNTER — Other Ambulatory Visit: Payer: Self-pay

## 2021-07-15 ENCOUNTER — Other Ambulatory Visit: Payer: Self-pay | Admitting: Oncology

## 2021-07-15 VITALS — BP 188/129 | HR 93 | Temp 98.5°F | Ht 65.0 in | Wt 145.6 lb

## 2021-07-15 DIAGNOSIS — C50912 Malignant neoplasm of unspecified site of left female breast: Secondary | ICD-10-CM

## 2021-07-15 MED ORDER — OXYCODONE-ACETAMINOPHEN 5-325 MG PO TABS: 1.0000 | ORAL_TABLET | Freq: Four times a day (QID) | ORAL | 0 refills | Status: DC | PRN

## 2021-07-15 NOTE — Patient Instructions (Signed)
We removed the drain today. We placed a dressing over the drain site and secured it with tape. You may

## 2021-07-15 NOTE — Progress Notes (Signed)
[place] returns today. Her axillary Jackson-Pratt drain output continues to run at 10 mL per 24 hours.  It is still clear serous fluid.  She has no evidence of accumulation on her flaps.  She seems to be a bit more emotional today.  Removing tape seems to cause a lot more pain.  She reports she has been working on moving her arms and restoring her range of motion.  We proceeded with removal of her last drain, and applied a dressing.  Anticipate another follow-up visit in 2 to 3 weeks.  Likely review her pathology at the next breast conference.

## 2021-07-26 NOTE — Progress Notes (Unsigned)
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  INTERVAL HISTORY: Patient returns to clinic today for further evaluation and consideration of cycle 12 of 12 of weekly Taxol.  She continues to have a peripheral neuropathy, but states it is not as bad as previous.  She otherwise feels well and is tolerating her treatments.  She does not complain of insomnia today.  She has no other neurologic complaints.  She denies any recent fevers or illnesses.  She has a good appetite and denies weight loss.  She has no chest pain, shortness of breath, cough, or hemoptysis.  She denies any nausea, vomiting, constipation, or diarrhea.  She has no urinary complaints.  Patient offers no further specific complaints today.  REVIEW OF SYSTEMS:   Review of Systems  Constitutional: Negative.  Negative for chills, fever and malaise/fatigue.  Respiratory: Negative.  Negative for cough, hemoptysis and shortness of breath.   Cardiovascular: Negative.  Negative for chest pain and leg swelling.  Gastrointestinal: Negative.  Negative for abdominal pain and nausea.  Genitourinary: Negative.  Negative for dysuria.  Musculoskeletal: Negative.  Negative for back pain.  Skin: Negative.  Negative for rash.  Neurological:  Positive for sensory change. Negative for dizziness, focal weakness, weakness and headaches.  Psychiatric/Behavioral:  The patient is not nervous/anxious and does not have insomnia.    As per HPI. Otherwise, a complete review of systems is negative.  PAST MEDICAL HISTORY: Past Medical History:  Diagnosis Date   Anxiety    Headache    Hypertension     Noncompliance     PAST SURGICAL HISTORY: Past Surgical History:  Procedure Laterality Date   BREAST BIOPSY Right 12/24/2020   mass, 9:00 8 cmfn venus marker, path pending   BREAST BIOPSY Left 12/24/2020   mass 3:00 retroareolar, ribbon, path pending   BREAST BIOPSY Left 12/24/2020   mass 3:00 5cmfn, heart marker, path pending   BREAST BIOPSY Left 12/24/2020   axilla LN, hyrdomarker shape 3 coil, path pending   BREAST LUMPECTOMY WITH RADIOFREQUENCY TAG IDENTIFICATION Right 06/24/2021   BREAST LUMPECTOMY,RADIO FREQ LOCALIZER,AXILLARY SENTINEL LYMPH NODE BIOPSY Right 07/05/2021   Procedure: BREAST LUMPECTOMY,RADIO FREQ LOCALIZER,AXILLARY SENTINEL LYMPH NODE BIOPSY;  Surgeon: Ronny Bacon, MD;  Location: [facility] ORS;  Service: General;  Laterality: Right;   MASTECTOMY MODIFIED RADICAL Left 07/05/2021   Procedure: MASTECTOMY MODIFIED RADICAL;  Surgeon: Ronny Bacon, MD;  Location: [facility] OR[facility];  Service: General;  Laterality: Left;   PORTACATH PLACEMENT Right 01/01/2021   Procedure: INSERTION PORT-A-CATH;  Surgeon: Ronny Bacon, MD;  Location: [facility] OR[facility];  Service: General;  Laterality: Right;   TUBAL LIGATION      FAMILY HISTORY: Family History  Problem Relation Age of Onset   COPD Mother    Hypertension Mother    Heart disease Father     ADVANCED DIRECTIVES (Y/N):  N  HEALTH MAINTENANCE: Social History   Tobacco Use   Smoking status: Every Day    Packs/day: 1.50    Types: Cigarettes   Smokeless tobacco: Never  Vaping Use   Vaping Use: Never used  Substance Use Topics   Alcohol use: Yes    Alcohol/week: 14.0 standard drinks    Types: 14 Cans  of beer per week    Comment: at least 2 beers daily   Drug use: No     Colonoscopy:  PAP:  Bone density:  Lipid panel:  Allergies  Allergen Reactions   Codeine Itching and Nausea And Vomiting    Current Outpatient Medications  Medication Sig Dispense Refill   acetaminophen (TYLENOL) 325 MG tablet Take 650 mg by mouth  every 6 (six) hours as needed.     ALPRAZolam (XANAX) 0.25 MG tablet Take 1 tablet (0.25 mg total) by mouth at bedtime. 30 tablet 2   bacitracin ointment Apply 1 application topically 2 (two) times daily. 120 g 0   diphenhydrAMINE HCl (BENADRYL ALLERGY PO) Take by mouth.     diphenhydrAMINE-zinc acetate (BENADRYL EXTRA STRENGTH) cream Apply 1 application topically 3 (three) times daily as needed for itching. 28.4 g 0   ibuprofen (ADVIL) 800 MG tablet Take 1 tablet (800 mg total) by mouth every 8 (eight) hours as needed for mild pain or fever. 30 tablet 0   labetalol (NORMODYNE) 100 MG tablet Take 1 tablet (100 mg total) by mouth 2 (two) times daily. 60 tablet 1   lidocaine-prilocaine (EMLA) cream Apply to affected area once 30 g 3   loratadine (CLARITIN) 10 MG tablet Take 10 mg by mouth daily.     ondansetron (ZOFRAN) 8 MG tablet Take 1 tablet (8 mg total) by mouth 2 (two) times daily. 60 tablet 2   oxyCODONE-acetaminophen (PERCOCET/ROXICET) 5-325 MG tablet Take 1 tablet by mouth every 6 (six) hours as needed for severe pain. 30 tablet 0   prednisoLONE (ORAPRED) 15 MG/5ML solution Take by mouth.     prochlorperazine (COMPAZINE) 10 MG tablet Take 1 tablet (10 mg total) by mouth every 6 (six) hours as needed for nausea or vomiting. 60 tablet 2   No current facility-administered medications for this visit.    OBJECTIVE: There were no vitals filed for this visit.    There is no height or weight on file to calculate BMI.    ECOG FS:0 - Asymptomatic  General: Well-developed, well-nourished, no acute distress. Eyes: Pink conjunctiva, anicteric sclera. HEENT: Normocephalic, moist mucous membranes. Lungs: No audible wheezing or coughing. Heart: Regular rate and rhythm. Abdomen: Soft, nontender, no obvious distention. Musculoskeletal: No edema, cyanosis, or clubbing. Neuro: Alert, answering all questions appropriately. Cranial nerves grossly intact. Skin: No rashes or petechiae noted. Psych:  Normal affect.  LAB RESULTS:  Lab Results  Component Value Date   NA 134 (L) 06/03/2021   K 3.5 06/03/2021   CL 103 06/03/2021   CO2 23 06/03/2021   GLUCOSE 125 (H) 06/03/2021   BUN 7 06/03/2021   CREATININE 0.73 06/03/2021   CALCIUM 8.8 (L) 06/03/2021   PROT 6.8 06/03/2021   ALBUMIN 3.5 06/03/2021   AST 21 06/03/2021   ALT 12 06/03/2021   ALKPHOS 68 06/03/2021   BILITOT 0.1 (L) 06/03/2021   GFRNONAA >60 06/03/2021   GFRAA >60 12/01/2017    Lab Results  Component Value Date   WBC 3.6 (L) 06/03/2021   NEUTROABS 1.7 06/03/2021   HGB 13.1 06/03/2021   HCT 39.8 06/03/2021   MCV 104.5 (H) 06/03/2021   PLT 290 06/03/2021     STUDIES: NM Sentinel Node Inj-No Rpt (Breast)  Result Date: 07/05/2021 Sulfur Colloid was injected by the Nuclear Medicine Technologist for sentinel lymph node localization.   MM Breast Surgical Specimen  Result Date: 07/05/2021 CLINICAL DATA:  Specimen radiograph of the bilateral breast status post left breast  mastectomy and right lumpectomy. EXAM: SPECIMEN RADIOGRAPH OF THE BILATERAL BREAST COMPARISON:  None Available. FINDINGS: Status post excision of the bilateral breast. The radiofrequency tag and venous shaped biopsy marker clip from the right breast are present, completely intact, and were marked for pathology. An x-ray of the mastectomy specimen of the left breast was performed demonstrating 2 separate pieces of tissue. In the smaller tissue specimen, there is a small HydroMARK spiral shaped biopsy marking clip. In the larger specimen, a heart shaped biopsy marking clip is identified. The ribbon shaped biopsy marking clip which was in the retroareolar left breast is not seen within the tissue samples. These findings were communicated with Dr. Christian Mate in the OR at 2:14 p.m. IMPRESSION: Specimen radiograph of the bilateral breast. Electronically Signed   By: Ammie Ferrier M.D.   On: 07/05/2021 14:17   MM Breast Surgical Specimen  Result Date:  07/05/2021 CLINICAL DATA:  Specimen radiograph of the bilateral breast status post left breast mastectomy and right lumpectomy. EXAM: SPECIMEN RADIOGRAPH OF THE BILATERAL BREAST COMPARISON:  None Available. FINDINGS: Status post excision of the bilateral breast. The radiofrequency tag and venous shaped biopsy marker clip from the right breast are present, completely intact, and were marked for pathology. An x-ray of the mastectomy specimen of the left breast was performed demonstrating 2 separate pieces of tissue. In the smaller tissue specimen, there is a small HydroMARK spiral shaped biopsy marking clip. In the larger specimen, a heart shaped biopsy marking clip is identified. The ribbon shaped biopsy marking clip which was in the retroareolar left breast is not seen within the tissue samples. These findings were communicated with Dr. Christian Mate in the OR at 2:14 p.m. IMPRESSION: Specimen radiograph of the bilateral breast. Electronically Signed   By: Ammie Ferrier M.D.   On: 07/05/2021 14:17  DG BREAST SURGICAL SPECIMEN NO CHARGE  Result Date: 07/05/2021 This procedure is a no report and no charge.  It will auto finalize.   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  PLAN:    1.  Bilateral breast cancer: Case discussed with pathology and this appears to be 2 distinct breast cancer primaries in her right and left breast both of which are ER/PR, HER-2 negative malignancies.  CT scan completed on December 30, 2020 reviewed independently and reported as above did not reveal any metastatic disease.  MUGA scan on January 08, 2021 reported an EF of 57.3%.  Patient has also had port placement.  Previously, surgery recommended upfront systemic therapy. Given patient's age, she will also require genetic testing.  Plan to give neoadjuvant Adriamycin plus Cytoxan every 2 weeks with Neulasta support for 4 cycles followed by weekly Taxol for 12 cycles.  Patient also may benefit from adjuvant Xeloda.   Patient completed cycle 4 of Adriamycin and Cytoxan on March 05, 2021.  Proceed with her 12th and final cycle of weekly Taxol today.  Patient has an appointment with surgery next week.  No follow-up has been scheduled at this time, but patient will follow-up approximately 2 weeks after her surgery to discuss her final pathology results and additional treatment planning.   2.  Nausea: Resolved.  Continue Compazine as needed. 3.  Anxiety/insomnia: Improved.  She was previously instructed that she can increase her alprazolam to 0.25 mg to 2 tabs as needed.   4.  Pain: Patient does not complain of this today.  Continue ibuprofen sparingly as needed. 5.  Hypokalemia: Resolved. 6.  Hypertension: Chronic and unchanged.  Proceed with treatment  as above. 7.  Hyponatremia: Chronic and unchanged. 8.  Peripheral neuropathy: Patient reports this is mildly improved.  Monitor. 9.  Leukopenia: Mild, monitor.  Patient expressed understanding and was in agreement with this plan. She also understands that She can call clinic at any time with any questions, concerns, or complaints.    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HCC) Staging form: Breast, AJCC 8th Edition - Clinical stage from 01/12/2021: Stage IIB (cT2, cN1, cM0, G3, ER+, PR+, HER2-) - Signed by Lloyd Huger, MD on 01/12/2021 Stage prefix: Initial diagnosis Histologic grading system: 3 grade system  Invasive ductal carcinoma of right breast [facility]) Staging form: Breast, AJCC 8th Edition - Clinical stage from 01/12/2021: Stage IB (cT2, cN0, cM0, G2, ER+, PR+, HER2-) - Signed by Lloyd Huger, MD on 01/12/2021 Stage prefix: Initial diagnosis Histologic grading system: 3 grade system  Lloyd Huger, MD   07/26/2021 9:57 AM

## 2021-07-27 ENCOUNTER — Encounter: Payer: Self-pay | Admitting: Oncology

## 2021-07-27 ENCOUNTER — Inpatient Hospital Stay: Payer: Medicaid Other | Attending: Oncology | Admitting: Oncology

## 2021-07-27 DIAGNOSIS — G620 Drug-induced polyneuropathy: Secondary | ICD-10-CM | POA: Diagnosis not present

## 2021-07-27 DIAGNOSIS — F17210 Nicotine dependence, cigarettes, uncomplicated: Secondary | ICD-10-CM | POA: Insufficient documentation

## 2021-07-27 DIAGNOSIS — F419 Anxiety disorder, unspecified: Secondary | ICD-10-CM | POA: Insufficient documentation

## 2021-07-27 DIAGNOSIS — C50912 Malignant neoplasm of unspecified site of left female breast: Secondary | ICD-10-CM | POA: Diagnosis not present

## 2021-07-27 DIAGNOSIS — E871 Hypo-osmolality and hyponatremia: Secondary | ICD-10-CM | POA: Insufficient documentation

## 2021-07-27 DIAGNOSIS — G4700 Insomnia, unspecified: Secondary | ICD-10-CM | POA: Diagnosis not present

## 2021-07-27 DIAGNOSIS — Z9012 Acquired absence of left breast and nipple: Secondary | ICD-10-CM | POA: Diagnosis not present

## 2021-07-27 DIAGNOSIS — C50811 Malignant neoplasm of overlapping sites of right female breast: Secondary | ICD-10-CM | POA: Diagnosis present

## 2021-07-27 DIAGNOSIS — I10 Essential (primary) hypertension: Secondary | ICD-10-CM | POA: Insufficient documentation

## 2021-07-27 DIAGNOSIS — T451X5D Adverse effect of antineoplastic and immunosuppressive drugs, subsequent encounter: Secondary | ICD-10-CM | POA: Insufficient documentation

## 2021-07-27 DIAGNOSIS — C50812 Malignant neoplasm of overlapping sites of left female breast: Secondary | ICD-10-CM | POA: Diagnosis present

## 2021-07-27 DIAGNOSIS — Z79899 Other long term (current) drug therapy: Secondary | ICD-10-CM | POA: Insufficient documentation

## 2021-07-29 ENCOUNTER — Encounter: Payer: Self-pay | Admitting: Surgery

## 2021-07-29 ENCOUNTER — Ambulatory Visit (INDEPENDENT_AMBULATORY_CARE_PROVIDER_SITE_OTHER): Payer: Medicaid Other | Admitting: Surgery

## 2021-07-29 VITALS — BP 175/141 | HR 99 | Temp 99.0°F | Wt 144.0 lb

## 2021-07-29 DIAGNOSIS — C50912 Malignant neoplasm of unspecified site of left female breast: Secondary | ICD-10-CM

## 2021-07-29 DIAGNOSIS — Z09 Encounter for follow-up examination after completed treatment for conditions other than malignant neoplasm: Secondary | ICD-10-CM

## 2021-07-29 DIAGNOSIS — C50411 Malignant neoplasm of upper-outer quadrant of right female breast: Secondary | ICD-10-CM

## 2021-07-29 DIAGNOSIS — C50412 Malignant neoplasm of upper-outer quadrant of left female breast: Secondary | ICD-10-CM

## 2021-07-29 NOTE — Patient Instructions (Addendum)
If you have any concerns or questions, please feel free to call our office. See follow up appointment below.    Shoulder Range of Motion Exercises  Shoulder range of motion (ROM) exercises are done to keep the shoulder moving freely or to increase movement. They are often recommended for people who have shoulder pain or stiffness or who are recovering from a shoulder surgery. Phase 1 exercise When you are able, do this exercise 1-2 times per day for 30-60 seconds in each direction, or as directed by your health care provider. Pendulum exercise     To do this exercise while sitting: Sit in a chair or at the edge of your bed with your feet flat on the floor. Let your affected arm hang down in front of you over the edge of the bed or chair. Relax your shoulder, arm, and hand. Rock your body so your arm gently swings in small circles. You can also use your unaffected arm to start the motion. Repeat changing the direction of the circles, swinging your arm left and right, and swinging your arm forward and back. To do this exercise while standing: Stand next to a sturdy chair or table, and hold on to it with your hand on your unaffected side. Bend forward at the waist. Bend your knees slightly. Relax your shoulder, arm, and hand. While keeping your shoulder relaxed, use body motion to swing your arm in small circles. Repeat changing the direction of the circles, swinging your arm left and right, and swinging your arm forward and back. Between exercises, stand up tall and take a short break to relax your lower back.  Phase 2 exercises Do these exercises 1-2 times per day or as told by your health care provider. Hold each stretch for 30 seconds, and repeat 3 times. Do the exercises with one or both arms as instructed by your health care provider. For these exercises, sit at a table with your hand and arm supported by the table. A chair that slides easily or has wheels can be helpful. External  rotation  Turn your chair so that your affected side is nearest to the table. Place your forearm on the table to your side. Bend your elbow in about a 90-degree angle (right angle) at the elbow, and place your hand palm-down on the table. Your elbow should be about 6 inches (15 cm) away from your side. Keeping your arm on the table, lean your body forward. Abduction  Turn your chair so that your affected side is nearest to the table. Place your forearm and hand on the table so that your thumb points toward the ceiling and your arm is straight out to your side. Slide your hand out to the side and away from you, using your unaffected arm to do the work. To increase the stretch, you can slide your chair away from the table. Flexion: forward stretch  Sit facing the table. Place your hand and elbow on the table in front of you. Slide your hand forward and away from you, using your unaffected arm to do the work. To increase the stretch, you can slide your chair backward. Phase 3 exercises Do these exercises 1-2 times per day or as told by your health care provider. Hold each stretch for 30 seconds, and repeat 3 times. Do the exercises with one or both arms as instructed by your health care provider. You will need a cane, a piece of PVC pipe, or a sturdy wooden dowel for the  wand exercises. Cross-body stretch: posterior capsule stretch  Lift your arm straight out in front of you. Bend your arm in a 90-degree angle (right angle) at the elbow so your forearm moves across your body. Use your other arm to gently pull the elbow across your body, toward your other shoulder. Wall climbs  Stand with your affected arm extended out to the side with your hand resting on a door frame. Slide your hand slowly up the door frame. To increase the stretch, step through the door frame. Keep your body upright and do not lean. Flexion     To do this exercise while standing: Hold the wand with both of your  hands, palms down. Using the other arm to help, lift your arms up and over your head, if able. Push upward with your other arm to gently increase the stretch. To do this exercise while lying down: Lie on your back with your elbows resting on the floor and the wand in both your hands. Your hands will be palm down, or pointing toward your feet. Lift your hands toward the ceiling, using your unaffected arm to help if needed. Bring your arms overhead as able, using your unaffected arm to help if needed.  Internal rotation  Stand while holding the wand behind you with both hands. Your unaffected arm should be extended above your head with the arm of the affected side extended behind you at the level of your waist. The wand should be pointing straight up and down as you hold it. Slowly pull the wand up behind your back by straightening the elbow of your unaffected arm and bending the elbow of your affected arm. External rotation  Lie on your back with your affected upper arm supported on a small pillow or rolled towel. When you first do this exercise, keep your upper arm close to your body. Over time, bring your arm up to a 90-degree angle (right angle) out to the side. Hold the wand across your stomach and with both hands palm up. Your elbow on your affected side should be bent at a 90-degree angle. Use your unaffected side to help push your forearm away from you and toward the floor. Keep your elbow on your affected side bent at a 90-degree angle. Contact a health care provider if you have: New or increasing pain. New numbness, tingling, weakness, or discoloration in your arm or hand. This information is not intended to replace advice given to you by your health care provider. Make sure you discuss any questions you have with your health care provider. Document Revised: 02/02/2021 Document Reviewed: 10/23/2020 Elsevier Patient Education  [place].

## 2021-07-29 NOTE — Progress Notes (Signed)
[facility] POST-OP OFFICE VISIT  07/29/2021  HPI: Briana Soto is a 46 y.o. female 24 days s/p left mastectomy.  She reports she is doing very well, with some persistent soreness of the left chest wall with movement.  Denies any fevers or chills.  Has not been doing range of motion exercises.  Recently saw Dr. Grayland Ormond, current plan is to follow through with radiation after additional healing.  Vital signs: BP (!) 175/141   Pulse 99   Temp 99 F (37.2 C) (Oral)   Wt 144 lb (65.3 kg)   SpO2 97%   BMI 23.96 kg/m    Physical Exam: Constitutional: She appears well  Skin: Left mastectomy flaps clean dry and intact well-healed, without evidence of hematoma or masses or nodularity.  Drain sites appear healed.  Range of motion significantly diminished.  Assessment/Plan: This is a 46 y.o. female 24 days s/p left modified radical mastectomy, postoperative diminished range of motion.  Patient Active Problem List   Diagnosis Date Noted   Bilateral breast cancer ([place]) 07/05/2021   Genetic testing 02/23/2021   Invasive ductal carcinoma of left breast ([place]) 01/12/2021   Invasive ductal carcinoma of right breast ([place]) 01/12/2021   Hypertensive urgency 11/29/2017   Hypokalemia 11/29/2017   Tachycardia 11/29/2017   Anxiety 09/29/2012   Essential hypertension 09/29/2012   Tobacco use 09/29/2012    -We reviewed range of motion exercises and the importance of reestablishing full range of motion.  We discussed the potential need to have her see physical therapy if engaging in daily/twice daily range of motion exercises does not restore full range of motion.  I believe she understands.  We will anticipate seeing her again in 3 to 6 months.   Ronny Bacon M.D., FACS 07/29/2021, 3:09 PM

## 2021-08-04 ENCOUNTER — Ambulatory Visit
Admission: RE | Admit: 2021-08-04 | Discharge: 2021-08-04 | Disposition: A | Discharge status: Home / Self Care | Payer: Medicaid Other | Source: Ambulatory Visit | Attending: Radiation Oncology | Admitting: Radiation Oncology

## 2021-08-04 ENCOUNTER — Encounter: Payer: Self-pay | Admitting: Radiation Oncology

## 2021-08-04 VITALS — BP 161/109 | HR 91 | Temp 97.0°F | Resp 16 | Ht 65.0 in | Wt 144.7 lb

## 2021-08-04 DIAGNOSIS — Z170 Estrogen receptor positive status [ER+]: Secondary | ICD-10-CM | POA: Insufficient documentation

## 2021-08-04 DIAGNOSIS — C50911 Malignant neoplasm of unspecified site of right female breast: Secondary | ICD-10-CM | POA: Diagnosis not present

## 2021-08-04 DIAGNOSIS — C50912 Malignant neoplasm of unspecified site of left female breast: Secondary | ICD-10-CM | POA: Diagnosis present

## 2021-08-04 DIAGNOSIS — F17210 Nicotine dependence, cigarettes, uncomplicated: Secondary | ICD-10-CM | POA: Diagnosis not present

## 2021-08-04 DIAGNOSIS — I10 Essential (primary) hypertension: Secondary | ICD-10-CM | POA: Diagnosis not present

## 2021-08-04 DIAGNOSIS — Z79899 Other long term (current) drug therapy: Secondary | ICD-10-CM | POA: Diagnosis not present

## 2021-08-04 NOTE — Consult Note (Signed)
NEW PATIENT EVALUATION  Name: Briana Soto  MRN: [id number]  Date:   08/04/2021     DOB: [birth year]   This 46 y.o. 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   PREVIOUS INVESTIGATIONS:  Mammogram ultrasound reviewed Pathology reports reviewed Clinical notes reviewed  HPI: Patient is a 46 year old female who presented with self discovered left breast mass.  Initial imaging demonstrated a palpable right axillary mass consistent with a benign epidermal inclusion cyst there was a suspicious mass at the 9 o'clock position on the right breast.  No axillary adenopathy was noted on ultrasound.  Left breast had a suspicious mass 3 o'clock position with a small satellite lesion adjacent to this mass also suspicious.  There were 4 enlarged left axillary lymph nodes.  CT scan chest abdomen pelvis showed no evidence of distant metastatic disease.  Biopsy of her left breast showed invasive mammary carcinoma no special type with pleomorphic lobular features.  Biopsy of her left axillary lymph node also was positive for metastatic carcinoma morphologically consistent with the breast primary.  Right breast showed invasive mammary carcinoma with lobular features.  Patient underwent neoadjuvant chemotherapy with Cytoxan Adriamycin followed by Taxol.  She then underwent left modified radical mastectomy and axillary lymph node dissection as well as a wide local excision of the right breast and sentinel node biopsy.  Right breast had an 8 mm focus of grade 2 invasive mammary  carcinoma of no special type.  3 sentinel lymph nodes were negative for metastatic disease margins were clear at 3 mm.  Left breast had a 4.2 cm invasive mammary carcinoma of no special type overall grade 2.  Anterior margin was in involved.  2 out of 26 lymph nodes were positive with extranodal extension.  She is seen today for evaluation of bilateral radiation.  She is doing well she is without complaint specifically denies any chest wall tenderness right breast tenderness cough or bone pain.  She has no desire for left-sided breast reconstruction.  PLANNED TREATMENT REGIMEN: Hypofractionated right breast radiation and left chest wall and peripheral lymphatic radiation  PAST MEDICAL HISTORY:  has a past medical history of Anxiety, Headache, Hypertension, and Noncompliance.    PAST SURGICAL HISTORY:  Past Surgical History:  Procedure Laterality Date   BREAST BIOPSY Right 12/24/2020   mass, 9:00 8 cmfn venus marker, path pending   BREAST BIOPSY Left 12/24/2020   mass 3:00 retroareolar, ribbon, path pending   BREAST BIOPSY Left 12/24/2020   mass 3:00 5cmfn, heart marker, path pending   BREAST BIOPSY Left 12/24/2020   axilla LN, hyrdomarker shape 3 coil, path pending   BREAST LUMPECTOMY WITH RADIOFREQUENCY TAG IDENTIFICATION Right 06/24/2021   BREAST LUMPECTOMY,RADIO FREQ LOCALIZER,AXILLARY SENTINEL LYMPH NODE BIOPSY Right 07/05/2021   Procedure: BREAST LUMPECTOMY,RADIO FREQ LOCALIZER,AXILLARY SENTINEL LYMPH NODE BIOPSY;  Surgeon: Ronny Bacon, MD;  Location: [place] [facility];  Service: General;  Laterality: Right;   MASTECTOMY MODIFIED RADICAL Left 07/05/2021   Procedure: MASTECTOMY MODIFIED RADICAL;  Surgeon: Ronny Bacon, MD;  Location: [facility];  Service: General;  Laterality: Left;   [place]  Right 01/01/2021   Procedure: INSERTION PORT-A-CATH;  Surgeon: Ronny Bacon, MD;  Location: [facility];  Service: General;  Laterality: Right;   TUBAL LIGATION      FAMILY HISTORY:  family history includes COPD in her mother; Heart disease in her father; Hypertension in her mother.  SOCIAL HISTORY:  reports that she has been smoking cigarettes. She has been smoking an average of 1.5 packs per day. She has never used smokeless tobacco. She reports current alcohol use of about 14.0 standard drinks of alcohol per week. She reports that she does not use drugs.  ALLERGIES: Codeine  MEDICATIONS:  Current Outpatient Medications  Medication Sig Dispense Refill   acetaminophen (TYLENOL) 325 MG tablet Take 650 mg by mouth every 6 (six) hours as needed.     ALPRAZolam (XANAX) 0.25 MG tablet Take 1 tablet (0.25 mg total) by mouth at bedtime. 30 tablet 2   bacitracin ointment Apply 1 application topically 2 (two) times daily. 120 g 0   diphenhydrAMINE HCl (BENADRYL ALLERGY PO) Take by mouth.     diphenhydrAMINE-zinc acetate (BENADRYL EXTRA STRENGTH) cream Apply 1 application topically 3 (three) times daily as needed for itching. 28.4 g 0   labetalol (NORMODYNE) 100 MG tablet Take 1 tablet (100 mg total) by mouth 2 (two) times daily. 60 tablet 1   lidocaine-prilocaine (EMLA) cream Apply to affected area once 30 g 3   loratadine (CLARITIN) 10 MG tablet Take 10 mg by mouth daily.     ondansetron (ZOFRAN) 8 MG tablet Take 1 tablet (8 mg total) by mouth 2 (two) times daily. 60 tablet 2   prednisoLONE (ORAPRED) 15 MG/5ML solution Take by mouth.     prochlorperazine (COMPAZINE) 10 MG tablet Take 1 tablet (10 mg total) by mouth every 6 (six) hours as needed for nausea or vomiting. 60 tablet 2   No current facility-administered medications for this encounter.    ECOG PERFORMANCE STATUS:  0 - Asymptomatic  REVIEW OF SYSTEMS: Patient denies any weight loss, fatigue, weakness, fever, chills or night sweats. Patient denies any loss of vision, blurred vision. Patient denies any ringing  of the ears or hearing loss. No irregular heartbeat. Patient denies heart murmur or history of fainting.  Patient denies any chest pain or pain radiating to her upper extremities. Patient denies any shortness of breath, difficulty breathing at night, cough or hemoptysis. Patient denies any swelling in the lower legs. Patient denies any nausea vomiting, vomiting of blood, or coffee ground material in the vomitus. Patient denies any stomach pain. Patient states has had normal bowel movements no significant constipation or diarrhea. Patient denies any dysuria, hematuria or significant nocturia. Patient denies any problems walking, swelling in the joints or loss of balance. Patient denies any skin changes, loss of hair or loss of weight. Patient denies any excessive worrying or anxiety or significant depression. Patient denies any problems with insomnia. Patient denies excessive thirst, polyuria, polydipsia. Patient denies any swollen glands, patient denies easy bruising or easy bleeding. Patient denies any recent infections, allergies or URI. Patient "s visual fields have not changed significantly in recent time.   PHYSICAL EXAM: BP (!) 161/109 (BP Location: Right Arm, Patient Position: Sitting, Cuff Size: Normal)   Pulse 91   Temp (!) 97 F (36.1 C) (Tympanic)   Resp 16   Ht 5' 5"  (1.651 m)   Wt 144 lb 11.2 oz (65.6 kg)   BMI 24.08 kg/m  Patient is status post left modified radical mastectomy incision is healing  well no mass in the chest wall is noted right breast is free of dominant mass.  No axillary or supraclavicular adenopathy is identified no evidence of lymphedema in her left upper extremity is noted.  Well-developed well-nourished patient in NAD. HEENT reveals PERLA, EOMI, discs not visualized.  Oral cavity is clear. No oral mucosal lesions are identified. Neck is clear without evidence of cervical or supraclavicular adenopathy. Lungs are clear to A&P. Cardiac examination is essentially unremarkable with regular rate and rhythm without murmur rub or thrill. Abdomen is benign with no organomegaly or  masses noted. Motor sensory and DTR levels are equal and symmetric in the upper and lower extremities. Cranial nerves II through XII are grossly intact. Proprioception is intact. No peripheral adenopathy or edema is identified. No motor or sensory levels are noted. Crude visual fields are within normal range.  LABORATORY DATA: Pathology report reviewed    RADIOLOGY RESULTS: Mammograms and ultrasound reviewed compatible with above-stated findings   IMPRESSION: Anatomic stage IIb of the left breast status post left modified radical mastectomy and axillary lymph node dissection and stage Ia right breast cancer status post wide local excision and sentinel node biopsy both tumors ER/PR positive HER2/neu not overexpressed in 46 year old female  PLAN: At this time based on her poor prognostic factors including tumor dimensions over 4 cm after neoadjuvant chemotherapy positive apical margin extracapsular extension of 2 lymph nodes I would recommend postmastectomy radiation to her left chest wall and peripheral lymphatics.  We will plan on delivering 5040 cGy in 28 fractions boosting her scar another 1600 centigrade based on the positive anterior margin.  For the right breast I would treat hypofractionated course of radiation therapy over 3 weeks boosting her scar another 1000 cGy using electron beam.  Would do both areas concurrently.  Risks and benefits of treatment including skin reaction fatigue alteration of blood counts possible inclusion of superficial lung possible development of lymphedema of her left upper extremity were all discussed in detail live emphasize she needs to exercise her left upper extremities much as possible to cut down the risks of lymphedema.  I have personally set up and ordered CT simulation next week.  Patient seems to comprehend my recommendations well.  I would like to take this opportunity to thank you for allowing me to participate in the care of your patient.Noreene Filbert, MD

## 2021-08-09 ENCOUNTER — Ambulatory Visit
Admission: RE | Admit: 2021-08-09 | Discharge: 2021-08-09 | Disposition: A | Discharge status: Home / Self Care | Payer: Medicaid Other | Source: Ambulatory Visit | Attending: Radiation Oncology | Admitting: Radiation Oncology

## 2021-08-09 DIAGNOSIS — C50912 Malignant neoplasm of unspecified site of left female breast: Secondary | ICD-10-CM | POA: Diagnosis not present

## 2021-08-11 DIAGNOSIS — C50912 Malignant neoplasm of unspecified site of left female breast: Secondary | ICD-10-CM | POA: Diagnosis not present

## 2021-08-12 ENCOUNTER — Other Ambulatory Visit: Payer: Self-pay | Admitting: *Deleted

## 2021-08-12 DIAGNOSIS — C50811 Malignant neoplasm of overlapping sites of right female breast: Secondary | ICD-10-CM

## 2021-08-16 ENCOUNTER — Ambulatory Visit
Admission: RE | Admit: 2021-08-16 | Discharge: 2021-08-16 | Disposition: A | Discharge status: Home / Self Care | Payer: Medicaid Other | Source: Ambulatory Visit | Attending: Radiation Oncology | Admitting: Radiation Oncology

## 2021-08-16 DIAGNOSIS — C50912 Malignant neoplasm of unspecified site of left female breast: Secondary | ICD-10-CM | POA: Diagnosis not present

## 2021-08-17 ENCOUNTER — Other Ambulatory Visit: Payer: Self-pay

## 2021-08-17 ENCOUNTER — Ambulatory Visit
Admission: RE | Admit: 2021-08-17 | Discharge: 2021-08-17 | Disposition: A | Discharge status: Home / Self Care | Payer: Medicaid Other | Source: Ambulatory Visit | Attending: Radiation Oncology | Admitting: Radiation Oncology

## 2021-08-17 DIAGNOSIS — C50912 Malignant neoplasm of unspecified site of left female breast: Secondary | ICD-10-CM | POA: Diagnosis not present

## 2021-08-17 LAB — RAD ONC ARIA SESSION SUMMARY
Course Elapsed Days: 0
Plan Fractions Treated to Date: 1
Plan Fractions Treated to Date: 1
Plan Prescribed Dose Per Fraction: 1.8 Gy
Plan Prescribed Dose Per Fraction: 2.66 Gy
Plan Total Fractions Prescribed: 16
Plan Total Fractions Prescribed: 28
Plan Total Prescribed Dose: 42.56 Gy
Plan Total Prescribed Dose: 50.4 Gy
Reference Point Dosage Given to Date: 1.8 Gy
Reference Point Dosage Given to Date: 2.66 Gy
Reference Point Session Dosage Given: 1.8 Gy
Reference Point Session Dosage Given: 2.66 Gy
Session Number: 1

## 2021-08-18 ENCOUNTER — Other Ambulatory Visit: Payer: Self-pay

## 2021-08-18 ENCOUNTER — Other Ambulatory Visit: Payer: Self-pay | Admitting: Oncology

## 2021-08-18 ENCOUNTER — Ambulatory Visit
Admission: RE | Admit: 2021-08-18 | Discharge: 2021-08-18 | Disposition: A | Discharge status: Home / Self Care | Payer: Medicaid Other | Source: Ambulatory Visit | Attending: Radiation Oncology | Admitting: Radiation Oncology

## 2021-08-18 DIAGNOSIS — C50912 Malignant neoplasm of unspecified site of left female breast: Secondary | ICD-10-CM | POA: Diagnosis not present

## 2021-08-18 LAB — RAD ONC ARIA SESSION SUMMARY
Course Elapsed Days: 1
Plan Fractions Treated to Date: 2
Plan Fractions Treated to Date: 2
Plan Prescribed Dose Per Fraction: 1.8 Gy
Plan Prescribed Dose Per Fraction: 2.66 Gy
Plan Total Fractions Prescribed: 16
Plan Total Fractions Prescribed: 28
Plan Total Prescribed Dose: 42.56 Gy
Plan Total Prescribed Dose: 50.4 Gy
Reference Point Dosage Given to Date: 3.6 Gy
Reference Point Dosage Given to Date: 5.32 Gy
Reference Point Session Dosage Given: 1.8 Gy
Reference Point Session Dosage Given: 2.66 Gy
Session Number: 2

## 2021-08-18 MED ORDER — ALPRAZOLAM 0.25 MG PO TABS: 0.2500 mg | ORAL_TABLET | Freq: Every day | ORAL | 2 refills | Status: DC

## 2021-08-19 ENCOUNTER — Other Ambulatory Visit: Payer: Self-pay

## 2021-08-19 ENCOUNTER — Ambulatory Visit
Admission: RE | Admit: 2021-08-19 | Discharge: 2021-08-19 | Disposition: A | Discharge status: Home / Self Care | Payer: Medicaid Other | Source: Ambulatory Visit | Attending: Radiation Oncology | Admitting: Radiation Oncology

## 2021-08-19 DIAGNOSIS — C50912 Malignant neoplasm of unspecified site of left female breast: Secondary | ICD-10-CM | POA: Diagnosis not present

## 2021-08-19 LAB — RAD ONC ARIA SESSION SUMMARY
Course Elapsed Days: 2
Plan Fractions Treated to Date: 3
Plan Fractions Treated to Date: 3
Plan Prescribed Dose Per Fraction: 1.8 Gy
Plan Prescribed Dose Per Fraction: 2.66 Gy
Plan Total Fractions Prescribed: 16
Plan Total Fractions Prescribed: 28
Plan Total Prescribed Dose: 42.56 Gy
Plan Total Prescribed Dose: 50.4 Gy
Reference Point Dosage Given to Date: 5.4 Gy
Reference Point Dosage Given to Date: 7.98 Gy
Reference Point Session Dosage Given: 1.8 Gy
Reference Point Session Dosage Given: 2.66 Gy
Session Number: 3

## 2021-08-20 ENCOUNTER — Other Ambulatory Visit: Payer: Self-pay

## 2021-08-20 ENCOUNTER — Ambulatory Visit
Admission: RE | Admit: 2021-08-20 | Discharge: 2021-08-20 | Disposition: A | Discharge status: Home / Self Care | Payer: Medicaid Other | Source: Ambulatory Visit | Attending: Radiation Oncology | Admitting: Radiation Oncology

## 2021-08-20 DIAGNOSIS — C50912 Malignant neoplasm of unspecified site of left female breast: Secondary | ICD-10-CM | POA: Diagnosis not present

## 2021-08-20 LAB — RAD ONC ARIA SESSION SUMMARY
Course Elapsed Days: 3
Plan Fractions Treated to Date: 4
Plan Fractions Treated to Date: 4
Plan Prescribed Dose Per Fraction: 1.8 Gy
Plan Prescribed Dose Per Fraction: 2.66 Gy
Plan Total Fractions Prescribed: 16
Plan Total Fractions Prescribed: 28
Plan Total Prescribed Dose: 42.56 Gy
Plan Total Prescribed Dose: 50.4 Gy
Reference Point Dosage Given to Date: 10.64 Gy
Reference Point Dosage Given to Date: 7.2 Gy
Reference Point Session Dosage Given: 1.8 Gy
Reference Point Session Dosage Given: 2.66 Gy
Session Number: 4

## 2021-08-23 ENCOUNTER — Other Ambulatory Visit: Payer: Self-pay

## 2021-08-23 ENCOUNTER — Ambulatory Visit
Admission: RE | Admit: 2021-08-23 | Discharge: 2021-08-23 | Disposition: A | Discharge status: Home / Self Care | Payer: Medicaid Other | Source: Ambulatory Visit | Attending: Radiation Oncology | Admitting: Radiation Oncology

## 2021-08-23 DIAGNOSIS — F17210 Nicotine dependence, cigarettes, uncomplicated: Secondary | ICD-10-CM | POA: Insufficient documentation

## 2021-08-23 DIAGNOSIS — C50812 Malignant neoplasm of overlapping sites of left female breast: Secondary | ICD-10-CM | POA: Diagnosis not present

## 2021-08-23 DIAGNOSIS — I10 Essential (primary) hypertension: Secondary | ICD-10-CM | POA: Insufficient documentation

## 2021-08-23 DIAGNOSIS — C50912 Malignant neoplasm of unspecified site of left female breast: Secondary | ICD-10-CM | POA: Insufficient documentation

## 2021-08-23 DIAGNOSIS — Z79899 Other long term (current) drug therapy: Secondary | ICD-10-CM | POA: Insufficient documentation

## 2021-08-23 DIAGNOSIS — C50811 Malignant neoplasm of overlapping sites of right female breast: Secondary | ICD-10-CM | POA: Diagnosis present

## 2021-08-23 DIAGNOSIS — C50911 Malignant neoplasm of unspecified site of right female breast: Secondary | ICD-10-CM | POA: Insufficient documentation

## 2021-08-23 DIAGNOSIS — Z170 Estrogen receptor positive status [ER+]: Secondary | ICD-10-CM | POA: Insufficient documentation

## 2021-08-23 LAB — RAD ONC ARIA SESSION SUMMARY
Course Elapsed Days: 6
Plan Fractions Treated to Date: 5
Plan Fractions Treated to Date: 5
Plan Prescribed Dose Per Fraction: 1.8 Gy
Plan Prescribed Dose Per Fraction: 2.66 Gy
Plan Total Fractions Prescribed: 16
Plan Total Fractions Prescribed: 28
Plan Total Prescribed Dose: 42.56 Gy
Plan Total Prescribed Dose: 50.4 Gy
Reference Point Dosage Given to Date: 13.3 Gy
Reference Point Dosage Given to Date: 9 Gy
Reference Point Session Dosage Given: 1.8 Gy
Reference Point Session Dosage Given: 2.66 Gy
Session Number: 5

## 2021-08-25 ENCOUNTER — Ambulatory Visit
Admission: RE | Admit: 2021-08-25 | Discharge: 2021-08-25 | Disposition: A | Discharge status: Home / Self Care | Payer: Medicaid Other | Source: Ambulatory Visit | Attending: Radiation Oncology | Admitting: Radiation Oncology

## 2021-08-25 ENCOUNTER — Other Ambulatory Visit: Payer: Self-pay

## 2021-08-25 DIAGNOSIS — C50812 Malignant neoplasm of overlapping sites of left female breast: Secondary | ICD-10-CM | POA: Diagnosis not present

## 2021-08-25 LAB — RAD ONC ARIA SESSION SUMMARY
Course Elapsed Days: 8
Plan Fractions Treated to Date: 6
Plan Fractions Treated to Date: 6
Plan Prescribed Dose Per Fraction: 1.8 Gy
Plan Prescribed Dose Per Fraction: 2.66 Gy
Plan Total Fractions Prescribed: 16
Plan Total Fractions Prescribed: 28
Plan Total Prescribed Dose: 42.56 Gy
Plan Total Prescribed Dose: 50.4 Gy
Reference Point Dosage Given to Date: 10.8 Gy
Reference Point Dosage Given to Date: 15.96 Gy
Reference Point Session Dosage Given: 1.8 Gy
Reference Point Session Dosage Given: 2.66 Gy
Session Number: 6

## 2021-08-26 ENCOUNTER — Other Ambulatory Visit: Payer: Self-pay | Admitting: *Deleted

## 2021-08-26 ENCOUNTER — Other Ambulatory Visit: Payer: Self-pay

## 2021-08-26 ENCOUNTER — Ambulatory Visit
Admission: RE | Admit: 2021-08-26 | Discharge: 2021-08-26 | Disposition: A | Discharge status: Home / Self Care | Payer: Medicaid Other | Source: Ambulatory Visit | Attending: Radiation Oncology | Admitting: Radiation Oncology

## 2021-08-26 DIAGNOSIS — C50812 Malignant neoplasm of overlapping sites of left female breast: Secondary | ICD-10-CM | POA: Diagnosis not present

## 2021-08-26 LAB — RAD ONC ARIA SESSION SUMMARY
Course Elapsed Days: 9
Plan Fractions Treated to Date: 7
Plan Fractions Treated to Date: 7
Plan Prescribed Dose Per Fraction: 1.8 Gy
Plan Prescribed Dose Per Fraction: 2.66 Gy
Plan Total Fractions Prescribed: 16
Plan Total Fractions Prescribed: 28
Plan Total Prescribed Dose: 42.56 Gy
Plan Total Prescribed Dose: 50.4 Gy
Reference Point Dosage Given to Date: 12.6 Gy
Reference Point Dosage Given to Date: 18.62 Gy
Reference Point Session Dosage Given: 1.8 Gy
Reference Point Session Dosage Given: 2.66 Gy
Session Number: 7

## 2021-08-27 ENCOUNTER — Ambulatory Visit
Admission: RE | Admit: 2021-08-27 | Discharge: 2021-08-27 | Disposition: A | Discharge status: Home / Self Care | Payer: Medicaid Other | Source: Ambulatory Visit | Attending: Radiation Oncology | Admitting: Radiation Oncology

## 2021-08-27 ENCOUNTER — Other Ambulatory Visit: Payer: Self-pay

## 2021-08-27 DIAGNOSIS — C50812 Malignant neoplasm of overlapping sites of left female breast: Secondary | ICD-10-CM | POA: Diagnosis not present

## 2021-08-27 LAB — RAD ONC ARIA SESSION SUMMARY
Course Elapsed Days: 10
Plan Fractions Treated to Date: 8
Plan Fractions Treated to Date: 8
Plan Prescribed Dose Per Fraction: 1.8 Gy
Plan Prescribed Dose Per Fraction: 2.66 Gy
Plan Total Fractions Prescribed: 16
Plan Total Fractions Prescribed: 28
Plan Total Prescribed Dose: 42.56 Gy
Plan Total Prescribed Dose: 50.4 Gy
Reference Point Dosage Given to Date: 14.4 Gy
Reference Point Dosage Given to Date: 21.28 Gy
Reference Point Session Dosage Given: 1.8 Gy
Reference Point Session Dosage Given: 2.66 Gy
Session Number: 8

## 2021-08-30 ENCOUNTER — Other Ambulatory Visit: Payer: Self-pay

## 2021-08-30 ENCOUNTER — Inpatient Hospital Stay: Payer: Medicaid Other | Attending: Oncology

## 2021-08-30 ENCOUNTER — Ambulatory Visit
Admission: RE | Admit: 2021-08-30 | Discharge: 2021-08-30 | Disposition: A | Discharge status: Home / Self Care | Payer: Medicaid Other | Source: Ambulatory Visit | Attending: Radiation Oncology | Admitting: Radiation Oncology

## 2021-08-30 DIAGNOSIS — C50811 Malignant neoplasm of overlapping sites of right female breast: Secondary | ICD-10-CM | POA: Insufficient documentation

## 2021-08-30 DIAGNOSIS — I10 Essential (primary) hypertension: Secondary | ICD-10-CM | POA: Insufficient documentation

## 2021-08-30 DIAGNOSIS — Z79899 Other long term (current) drug therapy: Secondary | ICD-10-CM | POA: Insufficient documentation

## 2021-08-30 DIAGNOSIS — F17210 Nicotine dependence, cigarettes, uncomplicated: Secondary | ICD-10-CM | POA: Insufficient documentation

## 2021-08-30 DIAGNOSIS — C50812 Malignant neoplasm of overlapping sites of left female breast: Secondary | ICD-10-CM | POA: Diagnosis not present

## 2021-08-30 DIAGNOSIS — Z170 Estrogen receptor positive status [ER+]: Secondary | ICD-10-CM | POA: Insufficient documentation

## 2021-08-30 LAB — CBC
HCT: 47.3 % — ABNORMAL HIGH (ref 36.0–46.0)
Hemoglobin: 16.1 g/dL — ABNORMAL HIGH (ref 12.0–15.0)
MCH: 32.3 pg (ref 26.0–34.0)
MCHC: 34 g/dL (ref 30.0–36.0)
MCV: 95 fL (ref 80.0–100.0)
Platelets: 234 10*3/uL (ref 150–400)
RBC: 4.98 MIL/uL (ref 3.87–5.11)
RDW: 15.4 % (ref 11.5–15.5)
WBC: 3.4 10*3/uL — ABNORMAL LOW (ref 4.0–10.5)
nRBC: 0 % (ref 0.0–0.2)

## 2021-08-30 LAB — RAD ONC ARIA SESSION SUMMARY
Course Elapsed Days: 13
Plan Fractions Treated to Date: 9
Plan Fractions Treated to Date: 9
Plan Prescribed Dose Per Fraction: 1.8 Gy
Plan Prescribed Dose Per Fraction: 2.66 Gy
Plan Total Fractions Prescribed: 16
Plan Total Fractions Prescribed: 28
Plan Total Prescribed Dose: 42.56 Gy
Plan Total Prescribed Dose: 50.4 Gy
Reference Point Dosage Given to Date: 16.2 Gy
Reference Point Dosage Given to Date: 23.94 Gy
Reference Point Session Dosage Given: 1.8 Gy
Reference Point Session Dosage Given: 2.66 Gy
Session Number: 9

## 2021-08-31 ENCOUNTER — Other Ambulatory Visit: Payer: Self-pay | Admitting: Oncology

## 2021-08-31 ENCOUNTER — Other Ambulatory Visit: Payer: Self-pay | Admitting: *Deleted

## 2021-08-31 ENCOUNTER — Other Ambulatory Visit: Payer: Self-pay

## 2021-08-31 ENCOUNTER — Ambulatory Visit: Payer: Medicaid Other

## 2021-08-31 ENCOUNTER — Ambulatory Visit
Admission: RE | Admit: 2021-08-31 | Discharge: 2021-08-31 | Disposition: A | Discharge status: Home / Self Care | Payer: Medicaid Other | Source: Ambulatory Visit | Attending: Radiation Oncology | Admitting: Radiation Oncology

## 2021-08-31 ENCOUNTER — Encounter: Payer: Medicaid Other | Admitting: Surgery

## 2021-08-31 DIAGNOSIS — C50812 Malignant neoplasm of overlapping sites of left female breast: Secondary | ICD-10-CM | POA: Diagnosis not present

## 2021-08-31 DIAGNOSIS — C50912 Malignant neoplasm of unspecified site of left female breast: Secondary | ICD-10-CM

## 2021-08-31 LAB — RAD ONC ARIA SESSION SUMMARY
Course Elapsed Days: 14
Plan Fractions Treated to Date: 10
Plan Fractions Treated to Date: 10
Plan Prescribed Dose Per Fraction: 1.8 Gy
Plan Prescribed Dose Per Fraction: 2.66 Gy
Plan Total Fractions Prescribed: 16
Plan Total Fractions Prescribed: 28
Plan Total Prescribed Dose: 42.56 Gy
Plan Total Prescribed Dose: 50.4 Gy
Reference Point Dosage Given to Date: 18 Gy
Reference Point Dosage Given to Date: 26.6 Gy
Reference Point Session Dosage Given: 1.8 Gy
Reference Point Session Dosage Given: 2.66 Gy
Session Number: 10

## 2021-08-31 MED ORDER — ALPRAZOLAM 0.25 MG PO TABS: 0.2500 mg | ORAL_TABLET | Freq: Every day | ORAL | 0 refills | Status: DC

## 2021-08-31 MED ORDER — LABETALOL HCL 100 MG PO TABS: 100.0000 mg | ORAL_TABLET | Freq: Two times a day (BID) | ORAL | 0 refills | Status: DC

## 2021-09-01 ENCOUNTER — Other Ambulatory Visit: Payer: Self-pay

## 2021-09-01 ENCOUNTER — Ambulatory Visit
Admission: RE | Admit: 2021-09-01 | Discharge: 2021-09-01 | Disposition: A | Discharge status: Home / Self Care | Payer: Medicaid Other | Source: Ambulatory Visit | Attending: Radiation Oncology | Admitting: Radiation Oncology

## 2021-09-01 DIAGNOSIS — C50812 Malignant neoplasm of overlapping sites of left female breast: Secondary | ICD-10-CM | POA: Diagnosis not present

## 2021-09-01 LAB — RAD ONC ARIA SESSION SUMMARY
Course Elapsed Days: 15
Plan Fractions Treated to Date: 11
Plan Fractions Treated to Date: 11
Plan Prescribed Dose Per Fraction: 1.8 Gy
Plan Prescribed Dose Per Fraction: 2.66 Gy
Plan Total Fractions Prescribed: 16
Plan Total Fractions Prescribed: 28
Plan Total Prescribed Dose: 42.56 Gy
Plan Total Prescribed Dose: 50.4 Gy
Reference Point Dosage Given to Date: 19.8 Gy
Reference Point Dosage Given to Date: 29.26 Gy
Reference Point Session Dosage Given: 1.8 Gy
Reference Point Session Dosage Given: 2.66 Gy
Session Number: 11

## 2021-09-02 ENCOUNTER — Ambulatory Visit
Admission: RE | Admit: 2021-09-02 | Discharge: 2021-09-02 | Disposition: A | Discharge status: Home / Self Care | Payer: Medicaid Other | Source: Ambulatory Visit | Attending: Radiation Oncology | Admitting: Radiation Oncology

## 2021-09-02 ENCOUNTER — Other Ambulatory Visit: Payer: Self-pay

## 2021-09-02 DIAGNOSIS — C50812 Malignant neoplasm of overlapping sites of left female breast: Secondary | ICD-10-CM | POA: Diagnosis not present

## 2021-09-02 LAB — RAD ONC ARIA SESSION SUMMARY
Course Elapsed Days: 16
Plan Fractions Treated to Date: 12
Plan Fractions Treated to Date: 12
Plan Prescribed Dose Per Fraction: 1.8 Gy
Plan Prescribed Dose Per Fraction: 2.66 Gy
Plan Total Fractions Prescribed: 16
Plan Total Fractions Prescribed: 28
Plan Total Prescribed Dose: 42.56 Gy
Plan Total Prescribed Dose: 50.4 Gy
Reference Point Dosage Given to Date: 21.6 Gy
Reference Point Dosage Given to Date: 31.92 Gy
Reference Point Session Dosage Given: 1.8 Gy
Reference Point Session Dosage Given: 2.66 Gy
Session Number: 12

## 2021-09-03 ENCOUNTER — Other Ambulatory Visit: Payer: Self-pay | Admitting: *Deleted

## 2021-09-03 ENCOUNTER — Other Ambulatory Visit: Payer: Self-pay

## 2021-09-03 ENCOUNTER — Ambulatory Visit
Admission: RE | Admit: 2021-09-03 | Discharge: 2021-09-03 | Disposition: A | Discharge status: Home / Self Care | Payer: Medicaid Other | Source: Ambulatory Visit | Attending: Radiation Oncology | Admitting: Radiation Oncology

## 2021-09-03 DIAGNOSIS — C50812 Malignant neoplasm of overlapping sites of left female breast: Secondary | ICD-10-CM | POA: Diagnosis not present

## 2021-09-03 LAB — RAD ONC ARIA SESSION SUMMARY
Course Elapsed Days: 17
Plan Fractions Treated to Date: 13
Plan Fractions Treated to Date: 13
Plan Prescribed Dose Per Fraction: 1.8 Gy
Plan Prescribed Dose Per Fraction: 2.66 Gy
Plan Total Fractions Prescribed: 16
Plan Total Fractions Prescribed: 28
Plan Total Prescribed Dose: 42.56 Gy
Plan Total Prescribed Dose: 50.4 Gy
Reference Point Dosage Given to Date: 23.4 Gy
Reference Point Dosage Given to Date: 34.58 Gy
Reference Point Session Dosage Given: 1.8 Gy
Reference Point Session Dosage Given: 2.66 Gy
Session Number: 13

## 2021-09-06 ENCOUNTER — Other Ambulatory Visit: Payer: Self-pay

## 2021-09-06 ENCOUNTER — Ambulatory Visit
Admission: RE | Admit: 2021-09-06 | Discharge: 2021-09-06 | Disposition: A | Discharge status: Home / Self Care | Payer: Medicaid Other | Source: Ambulatory Visit | Attending: Radiation Oncology | Admitting: Radiation Oncology

## 2021-09-06 DIAGNOSIS — C50812 Malignant neoplasm of overlapping sites of left female breast: Secondary | ICD-10-CM | POA: Diagnosis not present

## 2021-09-06 LAB — RAD ONC ARIA SESSION SUMMARY
Course Elapsed Days: 20
Plan Fractions Treated to Date: 14
Plan Fractions Treated to Date: 14
Plan Prescribed Dose Per Fraction: 1.8 Gy
Plan Prescribed Dose Per Fraction: 2.66 Gy
Plan Total Fractions Prescribed: 16
Plan Total Fractions Prescribed: 28
Plan Total Prescribed Dose: 42.56 Gy
Plan Total Prescribed Dose: 50.4 Gy
Reference Point Dosage Given to Date: 25.2 Gy
Reference Point Dosage Given to Date: 37.24 Gy
Reference Point Session Dosage Given: 1.8 Gy
Reference Point Session Dosage Given: 2.66 Gy
Session Number: 14

## 2021-09-07 ENCOUNTER — Ambulatory Visit
Admission: RE | Admit: 2021-09-07 | Discharge: 2021-09-07 | Disposition: A | Discharge status: Home / Self Care | Payer: Medicaid Other | Source: Ambulatory Visit | Attending: Radiation Oncology | Admitting: Radiation Oncology

## 2021-09-07 ENCOUNTER — Other Ambulatory Visit: Payer: Self-pay

## 2021-09-07 DIAGNOSIS — C50812 Malignant neoplasm of overlapping sites of left female breast: Secondary | ICD-10-CM | POA: Diagnosis not present

## 2021-09-07 LAB — RAD ONC ARIA SESSION SUMMARY
Course Elapsed Days: 21
Plan Fractions Treated to Date: 15
Plan Fractions Treated to Date: 15
Plan Prescribed Dose Per Fraction: 1.8 Gy
Plan Prescribed Dose Per Fraction: 2.66 Gy
Plan Total Fractions Prescribed: 16
Plan Total Fractions Prescribed: 28
Plan Total Prescribed Dose: 42.56 Gy
Plan Total Prescribed Dose: 50.4 Gy
Reference Point Dosage Given to Date: 27 Gy
Reference Point Dosage Given to Date: 39.9 Gy
Reference Point Session Dosage Given: 1.8 Gy
Reference Point Session Dosage Given: 2.66 Gy
Session Number: 15

## 2021-09-08 ENCOUNTER — Other Ambulatory Visit: Payer: Self-pay

## 2021-09-08 ENCOUNTER — Ambulatory Visit
Admission: RE | Admit: 2021-09-08 | Discharge: 2021-09-08 | Disposition: A | Discharge status: Home / Self Care | Payer: Medicaid Other | Source: Ambulatory Visit | Attending: Radiation Oncology | Admitting: Radiation Oncology

## 2021-09-08 DIAGNOSIS — C50812 Malignant neoplasm of overlapping sites of left female breast: Secondary | ICD-10-CM | POA: Diagnosis not present

## 2021-09-08 LAB — RAD ONC ARIA SESSION SUMMARY
Course Elapsed Days: 22
Plan Fractions Treated to Date: 16
Plan Fractions Treated to Date: 16
Plan Prescribed Dose Per Fraction: 1.8 Gy
Plan Prescribed Dose Per Fraction: 2.66 Gy
Plan Total Fractions Prescribed: 16
Plan Total Fractions Prescribed: 28
Plan Total Prescribed Dose: 42.56 Gy
Plan Total Prescribed Dose: 50.4 Gy
Reference Point Dosage Given to Date: 28.8 Gy
Reference Point Dosage Given to Date: 42.56 Gy
Reference Point Session Dosage Given: 1.8 Gy
Reference Point Session Dosage Given: 2.66 Gy
Session Number: 16

## 2021-09-09 ENCOUNTER — Other Ambulatory Visit: Payer: Self-pay

## 2021-09-09 ENCOUNTER — Ambulatory Visit
Admission: RE | Admit: 2021-09-09 | Discharge: 2021-09-09 | Disposition: A | Discharge status: Home / Self Care | Payer: Medicaid Other | Source: Ambulatory Visit | Attending: Radiation Oncology | Admitting: Radiation Oncology

## 2021-09-09 DIAGNOSIS — C50812 Malignant neoplasm of overlapping sites of left female breast: Secondary | ICD-10-CM | POA: Diagnosis not present

## 2021-09-09 LAB — RAD ONC ARIA SESSION SUMMARY
Course Elapsed Days: 23
Plan Fractions Treated to Date: 1
Plan Fractions Treated to Date: 17
Plan Prescribed Dose Per Fraction: 1.8 Gy
Plan Prescribed Dose Per Fraction: 2 Gy
Plan Total Fractions Prescribed: 28
Plan Total Fractions Prescribed: 5
Plan Total Prescribed Dose: 10 Gy
Plan Total Prescribed Dose: 50.4 Gy
Reference Point Dosage Given to Date: 30.6 Gy
Reference Point Dosage Given to Date: 44.56 Gy
Reference Point Session Dosage Given: 1.8 Gy
Reference Point Session Dosage Given: 2 Gy
Session Number: 17

## 2021-09-10 ENCOUNTER — Other Ambulatory Visit: Payer: Self-pay

## 2021-09-10 ENCOUNTER — Ambulatory Visit
Admission: RE | Admit: 2021-09-10 | Discharge: 2021-09-10 | Disposition: A | Discharge status: Home / Self Care | Payer: Medicaid Other | Source: Ambulatory Visit | Attending: Radiation Oncology | Admitting: Radiation Oncology

## 2021-09-10 DIAGNOSIS — C50812 Malignant neoplasm of overlapping sites of left female breast: Secondary | ICD-10-CM | POA: Diagnosis not present

## 2021-09-10 LAB — RAD ONC ARIA SESSION SUMMARY
Course Elapsed Days: 24
Plan Fractions Treated to Date: 18
Plan Fractions Treated to Date: 2
Plan Prescribed Dose Per Fraction: 1.8 Gy
Plan Prescribed Dose Per Fraction: 2 Gy
Plan Total Fractions Prescribed: 28
Plan Total Fractions Prescribed: 5
Plan Total Prescribed Dose: 10 Gy
Plan Total Prescribed Dose: 50.4 Gy
Reference Point Dosage Given to Date: 32.4 Gy
Reference Point Dosage Given to Date: 46.56 Gy
Reference Point Session Dosage Given: 1.8 Gy
Reference Point Session Dosage Given: 2 Gy
Session Number: 18

## 2021-09-13 ENCOUNTER — Other Ambulatory Visit: Payer: Self-pay

## 2021-09-13 ENCOUNTER — Inpatient Hospital Stay: Payer: Medicaid Other

## 2021-09-13 ENCOUNTER — Ambulatory Visit
Admission: RE | Admit: 2021-09-13 | Discharge: 2021-09-13 | Disposition: A | Discharge status: Home / Self Care | Payer: Medicaid Other | Source: Ambulatory Visit | Attending: Radiation Oncology | Admitting: Radiation Oncology

## 2021-09-13 DIAGNOSIS — C50811 Malignant neoplasm of overlapping sites of right female breast: Secondary | ICD-10-CM

## 2021-09-13 DIAGNOSIS — C50812 Malignant neoplasm of overlapping sites of left female breast: Secondary | ICD-10-CM | POA: Diagnosis not present

## 2021-09-13 LAB — CBC
HCT: 47 % — ABNORMAL HIGH (ref 36.0–46.0)
Hemoglobin: 16.1 g/dL — ABNORMAL HIGH (ref 12.0–15.0)
MCH: 32.2 pg (ref 26.0–34.0)
MCHC: 34.3 g/dL (ref 30.0–36.0)
MCV: 94 fL (ref 80.0–100.0)
Platelets: 232 10*3/uL (ref 150–400)
RBC: 5 MIL/uL (ref 3.87–5.11)
RDW: 15.3 % (ref 11.5–15.5)
WBC: 4 10*3/uL (ref 4.0–10.5)
nRBC: 0 % (ref 0.0–0.2)

## 2021-09-13 LAB — RAD ONC ARIA SESSION SUMMARY
Course Elapsed Days: 27
Plan Fractions Treated to Date: 19
Plan Fractions Treated to Date: 3
Plan Prescribed Dose Per Fraction: 1.8 Gy
Plan Prescribed Dose Per Fraction: 2 Gy
Plan Total Fractions Prescribed: 28
Plan Total Fractions Prescribed: 5
Plan Total Prescribed Dose: 10 Gy
Plan Total Prescribed Dose: 50.4 Gy
Reference Point Dosage Given to Date: 34.2 Gy
Reference Point Dosage Given to Date: 48.56 Gy
Reference Point Session Dosage Given: 1.8 Gy
Reference Point Session Dosage Given: 2 Gy
Session Number: 19

## 2021-09-14 ENCOUNTER — Ambulatory Visit: Payer: Medicaid Other

## 2021-09-15 ENCOUNTER — Ambulatory Visit
Admission: RE | Admit: 2021-09-15 | Discharge: 2021-09-15 | Disposition: A | Discharge status: Home / Self Care | Payer: Medicaid Other | Source: Ambulatory Visit | Attending: Radiation Oncology | Admitting: Radiation Oncology

## 2021-09-15 ENCOUNTER — Other Ambulatory Visit: Payer: Self-pay

## 2021-09-15 DIAGNOSIS — C50812 Malignant neoplasm of overlapping sites of left female breast: Secondary | ICD-10-CM | POA: Diagnosis not present

## 2021-09-15 LAB — RAD ONC ARIA SESSION SUMMARY
Course Elapsed Days: 29
Plan Fractions Treated to Date: 20
Plan Fractions Treated to Date: 4
Plan Prescribed Dose Per Fraction: 1.8 Gy
Plan Prescribed Dose Per Fraction: 2 Gy
Plan Total Fractions Prescribed: 28
Plan Total Fractions Prescribed: 5
Plan Total Prescribed Dose: 10 Gy
Plan Total Prescribed Dose: 50.4 Gy
Reference Point Dosage Given to Date: 36 Gy
Reference Point Dosage Given to Date: 50.56 Gy
Reference Point Session Dosage Given: 1.8 Gy
Reference Point Session Dosage Given: 2 Gy
Session Number: 20

## 2021-09-16 ENCOUNTER — Other Ambulatory Visit: Payer: Self-pay

## 2021-09-16 ENCOUNTER — Ambulatory Visit
Admission: RE | Admit: 2021-09-16 | Discharge: 2021-09-16 | Disposition: A | Discharge status: Home / Self Care | Payer: Medicaid Other | Source: Ambulatory Visit | Attending: Radiation Oncology | Admitting: Radiation Oncology

## 2021-09-16 DIAGNOSIS — C50812 Malignant neoplasm of overlapping sites of left female breast: Secondary | ICD-10-CM | POA: Diagnosis not present

## 2021-09-16 LAB — RAD ONC ARIA SESSION SUMMARY
Course Elapsed Days: 30
Plan Fractions Treated to Date: 21
Plan Fractions Treated to Date: 5
Plan Prescribed Dose Per Fraction: 1.8 Gy
Plan Prescribed Dose Per Fraction: 2 Gy
Plan Total Fractions Prescribed: 28
Plan Total Fractions Prescribed: 5
Plan Total Prescribed Dose: 10 Gy
Plan Total Prescribed Dose: 50.4 Gy
Reference Point Dosage Given to Date: 37.8 Gy
Reference Point Dosage Given to Date: 52.56 Gy
Reference Point Session Dosage Given: 1.8 Gy
Reference Point Session Dosage Given: 2 Gy
Session Number: 21

## 2021-09-17 ENCOUNTER — Other Ambulatory Visit: Payer: Self-pay

## 2021-09-17 ENCOUNTER — Ambulatory Visit
Admission: RE | Admit: 2021-09-17 | Discharge: 2021-09-17 | Disposition: A | Discharge status: Home / Self Care | Payer: Medicaid Other | Source: Ambulatory Visit | Attending: Radiation Oncology | Admitting: Radiation Oncology

## 2021-09-17 DIAGNOSIS — C50812 Malignant neoplasm of overlapping sites of left female breast: Secondary | ICD-10-CM | POA: Diagnosis not present

## 2021-09-17 LAB — RAD ONC ARIA SESSION SUMMARY
Course Elapsed Days: 31
Plan Fractions Treated to Date: 22
Plan Prescribed Dose Per Fraction: 1.8 Gy
Plan Total Fractions Prescribed: 28
Plan Total Prescribed Dose: 50.4 Gy
Reference Point Dosage Given to Date: 39.6 Gy
Reference Point Session Dosage Given: 1.8 Gy
Session Number: 22

## 2021-09-17 NOTE — Progress Notes (Unsigned)
[facility]  [phone] [phone]  ID: [name] OB: [birth year]  MR#: [id number]  GLO#:[id number]  Patient Care Team: Carrolyn Meiers, MD as PCP - General (Internal Medicine) Rico Junker, RN as Registered Nurse Theodore Demark, RN (Inactive) as Registered Nurse  CHIEF COMPLAINT: Bilateral ER/PR positive, HER2 negative breast cancer.  INTERVAL HISTORY: Patient returns to clinic today several weeks after her bilateral breast surgery to discuss her final pathology results and treatment planning.  She tolerated her surgery well without significant side effects.  She has no neurologic complaints.  She denies any recent fevers or illnesses.  She has a good appetite and denies weight loss.  She has no chest pain, shortness of breath, cough, or hemoptysis.  She denies any nausea, vomiting, constipation, or diarrhea.  She has no urinary complaints.  Patient offers no further specific complaints today.  REVIEW OF SYSTEMS:   Review of Systems  Constitutional: Negative.  Negative for chills, fever and malaise/fatigue.  Respiratory: Negative.  Negative for cough, hemoptysis and shortness of breath.   Cardiovascular: Negative.  Negative for chest pain and leg swelling.  Gastrointestinal: Negative.  Negative for abdominal pain and nausea.  Genitourinary: Negative.  Negative for dysuria.  Musculoskeletal: Negative.  Negative for back pain.  Skin: Negative.  Negative for rash.  Neurological: Negative.  Negative for dizziness, sensory change, focal weakness, weakness and headaches.  Psychiatric/Behavioral: Negative.  The patient is not nervous/anxious and does not have insomnia.     As per HPI. Otherwise, a complete review of systems is negative.  PAST MEDICAL HISTORY: Past Medical History:  Diagnosis Date   Anxiety    Headache    Hypertension    Noncompliance     PAST SURGICAL HISTORY: Past Surgical History:  Procedure  Laterality Date   BREAST BIOPSY Right 12/24/2020   mass, 9:00 8 cmfn venus marker, path pending   BREAST BIOPSY Left 12/24/2020   mass 3:00 retroareolar, ribbon, path pending   BREAST BIOPSY Left 12/24/2020   mass 3:00 5cmfn, heart marker, path pending   BREAST BIOPSY Left 12/24/2020   axilla LN, hyrdomarker shape 3 coil, path pending   BREAST LUMPECTOMY WITH RADIOFREQUENCY TAG IDENTIFICATION Right 06/24/2021   BREAST LUMPECTOMY,RADIO FREQ LOCALIZER,AXILLARY SENTINEL LYMPH NODE BIOPSY Right 07/05/2021   Procedure: BREAST LUMPECTOMY,RADIO FREQ LOCALIZER,AXILLARY SENTINEL LYMPH NODE BIOPSY;  Surgeon: Ronny Bacon, MD;  Location: [facility];  Service: General;  Laterality: Right;   MASTECTOMY MODIFIED RADICAL Left 07/05/2021   Procedure: MASTECTOMY MODIFIED RADICAL;  Surgeon: Ronny Bacon, MD;  Location: [facility];  Service: General;  Laterality: Left;   PORTACATH PLACEMENT Right 01/01/2021   Procedure: INSERTION PORT-A-CATH;  Surgeon: Ronny Bacon, MD;  Location: [facility];  Service: General;  Laterality: Right;   TUBAL LIGATION      FAMILY HISTORY: Family History  Problem Relation Age of Onset   COPD Mother    Hypertension Mother    Heart disease Father     ADVANCED DIRECTIVES (Y/N):  N  HEALTH MAINTENANCE: Social History   Tobacco Use   Smoking status: Every Day    Packs/day: 1.50    Types: Cigarettes   Smokeless tobacco: Never  Vaping Use   Vaping Use: Never used  Substance Use Topics   Alcohol use: Yes    Alcohol/week: 14.0 standard drinks of alcohol    Types: 14 Cans of beer per week    Comment: at least 2 beers daily   Drug use: No  Colonoscopy:  PAP:  Bone density:  Lipid panel:  Allergies  Allergen Reactions   Codeine Itching and Nausea And Vomiting    Current Outpatient Medications  Medication Sig Dispense Refill   acetaminophen (TYLENOL) 325 MG tablet Take 650 mg by mouth every 6 (six) hours as needed.     ALPRAZolam (XANAX) 0.25 MG tablet  Take 1 tablet (0.25 mg total) by mouth at bedtime. 30 tablet 0   bacitracin ointment Apply 1 application topically 2 (two) times daily. 120 g 0   diphenhydrAMINE HCl (BENADRYL ALLERGY PO) Take by mouth.     diphenhydrAMINE-zinc acetate (BENADRYL EXTRA STRENGTH) cream Apply 1 application topically 3 (three) times daily as needed for itching. 28.4 g 0   labetalol (NORMODYNE) 100 MG tablet TAKE ONE TABLET BY MOUTH TWICE DAILY 60 tablet 0   lidocaine-prilocaine (EMLA) cream Apply to affected area once 30 g 3   loratadine (CLARITIN) 10 MG tablet Take 10 mg by mouth daily.     ondansetron (ZOFRAN) 8 MG tablet Take 1 tablet (8 mg total) by mouth 2 (two) times daily. 60 tablet 2   prednisoLONE (ORAPRED) 15 MG/5ML solution Take by mouth.     prochlorperazine (COMPAZINE) 10 MG tablet Take 1 tablet (10 mg total) by mouth every 6 (six) hours as needed for nausea or vomiting. 60 tablet 2   No current facility-administered medications for this visit.    OBJECTIVE: There were no vitals filed for this visit.    There is no height or weight on file to calculate BMI.    ECOG FS:0 - Asymptomatic  General: Well-developed, well-nourished, no acute distress. Eyes: Pink conjunctiva, anicteric sclera. HEENT: Normocephalic, moist mucous membranes. Breast: Left mastectomy, right lumpectomy. Lungs: No audible wheezing or coughing. Heart: Regular rate and rhythm. Abdomen: Soft, nontender, no obvious distention. Musculoskeletal: No edema, cyanosis, or clubbing. Neuro: Alert, answering all questions appropriately. Cranial nerves grossly intact. Skin: No rashes or petechiae noted. Psych: Normal affect.   LAB RESULTS:  Lab Results  Component Value Date   NA 134 (L) 06/03/2021   K 3.5 06/03/2021   CL 103 06/03/2021   CO2 23 06/03/2021   GLUCOSE 125 (H) 06/03/2021   BUN 7 06/03/2021   CREATININE 0.73 06/03/2021   CALCIUM 8.8 (L) 06/03/2021   PROT 6.8 06/03/2021   ALBUMIN 3.5 06/03/2021   AST 21 06/03/2021    ALT 12 06/03/2021   ALKPHOS 68 06/03/2021   BILITOT 0.1 (L) 06/03/2021   GFRNONAA >60 06/03/2021   GFRAA >60 12/01/2017    Lab Results  Component Value Date   WBC 4.0 09/13/2021   NEUTROABS 1.7 06/03/2021   HGB 16.1 (H) 09/13/2021   HCT 47.0 (H) 09/13/2021   MCV 94.0 09/13/2021   PLT 232 09/13/2021     STUDIES: No results found.  ASSESSMENT: Bilateral ER/PR positive, HER2 negative breast cancer.  PLAN:    1.  Bilateral ER/PR positive, HER2 negative breast cancer: Patient completed neoadjuvant chemotherapy using Adriamycin, Cytoxan, followed by weekly Taxol on June 03, 2021.  She subsequently underwent left mastectomy, and right lumpectomy with residual malignancy in both breasts.  Right side had negative lymph nodes, left axillary dissection revealed 2 of 26 lymph nodes positive for disease. Because patient had a right lumpectomy, she will benefit from adjuvant XRT on that side and referral sent to radiation oncology.  Because of the ER/PR status of her tumor, she will also benefit from tamoxifen for minimum of 5 years.  Return to clinic near  the conclusion of radiation for further evaluation and to initiate tamoxifen.     2.  Nausea: Resolved.  Continue Compazine as needed. 3.  Anxiety/insomnia: Improved.  She was previously instructed that she can increase her alprazolam to 0.25 mg to 2 tabs as needed.   4.  Pain: Patient does not complain of this today.   5.  Hypokalemia: Resolved. 6.  Hypertension: Chronic and unchanged. 7.  Hyponatremia: Chronic and unchanged.  Patient's most recent sodium level was 134. 8.  Peripheral neuropathy: Patient does not complain of this today.  Monitor.   Patient expressed understanding and was in agreement with this plan. She also understands that She can call clinic at any time with any questions, concerns, or complaints.    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HCC) Staging form: Breast, AJCC 8th Edition - Clinical  stage from 01/12/2021: Stage IIB (cT2, cN1, cM0, G3, ER+, PR+, HER2-) - Signed by Lloyd Huger, MD on 01/12/2021 Stage prefix: Initial diagnosis Histologic grading system: 3 grade system  Invasive ductal carcinoma of right breast [facility]) Staging form: Breast, AJCC 8th Edition - Clinical stage from 01/12/2021: Stage IB (cT2, cN0, cM0, G2, ER+, PR+, HER2-) - Signed by Lloyd Huger, MD on 01/12/2021 Stage prefix: Initial diagnosis Histologic grading system: 3 grade system  Lloyd Huger, MD   09/17/2021 4:54 PM

## 2021-09-20 ENCOUNTER — Other Ambulatory Visit: Payer: Self-pay | Admitting: *Deleted

## 2021-09-20 ENCOUNTER — Other Ambulatory Visit: Payer: Self-pay

## 2021-09-20 ENCOUNTER — Ambulatory Visit
Admission: RE | Admit: 2021-09-20 | Discharge: 2021-09-20 | Disposition: A | Discharge status: Home / Self Care | Payer: Medicaid Other | Source: Ambulatory Visit | Attending: Radiation Oncology | Admitting: Radiation Oncology

## 2021-09-20 DIAGNOSIS — C50812 Malignant neoplasm of overlapping sites of left female breast: Secondary | ICD-10-CM | POA: Diagnosis not present

## 2021-09-20 LAB — RAD ONC ARIA SESSION SUMMARY
Course Elapsed Days: 34
Plan Fractions Treated to Date: 23
Plan Prescribed Dose Per Fraction: 1.8 Gy
Plan Total Fractions Prescribed: 28
Plan Total Prescribed Dose: 50.4 Gy
Reference Point Dosage Given to Date: 41.4 Gy
Reference Point Session Dosage Given: 1.8 Gy
Session Number: 23

## 2021-09-20 MED ORDER — SILVER SULFADIAZINE 1 % EX CREA: 1.0000 | TOPICAL_CREAM | Freq: Two times a day (BID) | CUTANEOUS | 2 refills | Status: DC

## 2021-09-21 ENCOUNTER — Other Ambulatory Visit: Payer: Self-pay

## 2021-09-21 ENCOUNTER — Inpatient Hospital Stay (HOSPITAL_BASED_OUTPATIENT_CLINIC_OR_DEPARTMENT_OTHER): Payer: Medicaid Other | Admitting: Oncology

## 2021-09-21 ENCOUNTER — Ambulatory Visit
Admission: RE | Admit: 2021-09-21 | Discharge: 2021-09-21 | Disposition: A | Discharge status: Home / Self Care | Payer: Medicaid Other | Source: Ambulatory Visit | Attending: Radiation Oncology | Admitting: Radiation Oncology

## 2021-09-21 ENCOUNTER — Encounter: Payer: Self-pay | Admitting: Oncology

## 2021-09-21 VITALS — BP 153/114 | HR 91 | Temp 97.2°F | Resp 18 | Wt 136.8 lb

## 2021-09-21 DIAGNOSIS — C50811 Malignant neoplasm of overlapping sites of right female breast: Secondary | ICD-10-CM | POA: Insufficient documentation

## 2021-09-21 DIAGNOSIS — Z170 Estrogen receptor positive status [ER+]: Secondary | ICD-10-CM | POA: Insufficient documentation

## 2021-09-21 DIAGNOSIS — C50912 Malignant neoplasm of unspecified site of left female breast: Secondary | ICD-10-CM

## 2021-09-21 DIAGNOSIS — G4700 Insomnia, unspecified: Secondary | ICD-10-CM | POA: Insufficient documentation

## 2021-09-21 DIAGNOSIS — Z79899 Other long term (current) drug therapy: Secondary | ICD-10-CM | POA: Insufficient documentation

## 2021-09-21 DIAGNOSIS — F17210 Nicotine dependence, cigarettes, uncomplicated: Secondary | ICD-10-CM | POA: Insufficient documentation

## 2021-09-21 DIAGNOSIS — C50911 Malignant neoplasm of unspecified site of right female breast: Secondary | ICD-10-CM | POA: Diagnosis not present

## 2021-09-21 DIAGNOSIS — I10 Essential (primary) hypertension: Secondary | ICD-10-CM | POA: Insufficient documentation

## 2021-09-21 DIAGNOSIS — E871 Hypo-osmolality and hyponatremia: Secondary | ICD-10-CM | POA: Insufficient documentation

## 2021-09-21 DIAGNOSIS — C50812 Malignant neoplasm of overlapping sites of left female breast: Secondary | ICD-10-CM | POA: Insufficient documentation

## 2021-09-21 DIAGNOSIS — F419 Anxiety disorder, unspecified: Secondary | ICD-10-CM | POA: Insufficient documentation

## 2021-09-21 DIAGNOSIS — G629 Polyneuropathy, unspecified: Secondary | ICD-10-CM | POA: Insufficient documentation

## 2021-09-21 LAB — RAD ONC ARIA SESSION SUMMARY
Course Elapsed Days: 35
Plan Fractions Treated to Date: 24
Plan Prescribed Dose Per Fraction: 1.8 Gy
Plan Total Fractions Prescribed: 28
Plan Total Prescribed Dose: 50.4 Gy
Reference Point Dosage Given to Date: 43.2 Gy
Reference Point Session Dosage Given: 1.8 Gy
Session Number: 24

## 2021-09-21 MED ORDER — TAMOXIFEN CITRATE 20 MG PO TABS: 20.0000 mg | ORAL_TABLET | Freq: Every day | ORAL | 3 refills | Status: DC

## 2021-09-22 ENCOUNTER — Other Ambulatory Visit: Payer: Self-pay

## 2021-09-22 ENCOUNTER — Ambulatory Visit
Admission: RE | Admit: 2021-09-22 | Discharge: 2021-09-22 | Disposition: A | Discharge status: Home / Self Care | Payer: Medicaid Other | Source: Ambulatory Visit | Attending: Radiation Oncology | Admitting: Radiation Oncology

## 2021-09-22 DIAGNOSIS — C50912 Malignant neoplasm of unspecified site of left female breast: Secondary | ICD-10-CM | POA: Diagnosis not present

## 2021-09-22 LAB — RAD ONC ARIA SESSION SUMMARY
Course Elapsed Days: 36
Plan Fractions Treated to Date: 25
Plan Prescribed Dose Per Fraction: 1.8 Gy
Plan Total Fractions Prescribed: 28
Plan Total Prescribed Dose: 50.4 Gy
Reference Point Dosage Given to Date: 45 Gy
Reference Point Session Dosage Given: 1.8 Gy
Session Number: 25

## 2021-09-23 ENCOUNTER — Other Ambulatory Visit: Payer: Self-pay

## 2021-09-23 ENCOUNTER — Ambulatory Visit
Admission: RE | Admit: 2021-09-23 | Discharge: 2021-09-23 | Disposition: A | Discharge status: Home / Self Care | Payer: Medicaid Other | Source: Ambulatory Visit | Attending: Radiation Oncology | Admitting: Radiation Oncology

## 2021-09-23 DIAGNOSIS — C50912 Malignant neoplasm of unspecified site of left female breast: Secondary | ICD-10-CM | POA: Diagnosis not present

## 2021-09-23 LAB — RAD ONC ARIA SESSION SUMMARY
Course Elapsed Days: 37
Plan Fractions Treated to Date: 26
Plan Prescribed Dose Per Fraction: 1.8 Gy
Plan Total Fractions Prescribed: 28
Plan Total Prescribed Dose: 50.4 Gy
Reference Point Dosage Given to Date: 46.8 Gy
Reference Point Session Dosage Given: 1.8 Gy
Session Number: 26

## 2021-09-24 ENCOUNTER — Other Ambulatory Visit: Payer: Self-pay

## 2021-09-24 ENCOUNTER — Ambulatory Visit
Admission: RE | Admit: 2021-09-24 | Discharge: 2021-09-24 | Disposition: A | Discharge status: Home / Self Care | Payer: Medicaid Other | Source: Ambulatory Visit | Attending: Radiation Oncology | Admitting: Radiation Oncology

## 2021-09-24 DIAGNOSIS — C50912 Malignant neoplasm of unspecified site of left female breast: Secondary | ICD-10-CM | POA: Diagnosis not present

## 2021-09-24 LAB — RAD ONC ARIA SESSION SUMMARY
Course Elapsed Days: 38
Plan Fractions Treated to Date: 27
Plan Prescribed Dose Per Fraction: 1.8 Gy
Plan Total Fractions Prescribed: 28
Plan Total Prescribed Dose: 50.4 Gy
Reference Point Dosage Given to Date: 48.6 Gy
Reference Point Session Dosage Given: 1.8 Gy
Session Number: 27

## 2021-09-27 ENCOUNTER — Ambulatory Visit
Admission: RE | Admit: 2021-09-27 | Discharge: 2021-09-27 | Disposition: A | Discharge status: Home / Self Care | Payer: Medicaid Other | Source: Ambulatory Visit | Attending: Radiation Oncology | Admitting: Radiation Oncology

## 2021-09-27 ENCOUNTER — Other Ambulatory Visit: Payer: Self-pay

## 2021-09-27 ENCOUNTER — Ambulatory Visit: Payer: Medicaid Other

## 2021-09-27 ENCOUNTER — Inpatient Hospital Stay: Payer: Medicaid Other

## 2021-09-27 DIAGNOSIS — C50912 Malignant neoplasm of unspecified site of left female breast: Secondary | ICD-10-CM | POA: Diagnosis not present

## 2021-09-27 DIAGNOSIS — Z170 Estrogen receptor positive status [ER+]: Secondary | ICD-10-CM

## 2021-09-27 LAB — CBC
HCT: 46.7 % — ABNORMAL HIGH (ref 36.0–46.0)
Hemoglobin: 15.7 g/dL — ABNORMAL HIGH (ref 12.0–15.0)
MCH: 32.3 pg (ref 26.0–34.0)
MCHC: 33.6 g/dL (ref 30.0–36.0)
MCV: 96.1 fL (ref 80.0–100.0)
Platelets: 223 10*3/uL (ref 150–400)
RBC: 4.86 MIL/uL (ref 3.87–5.11)
RDW: 14.2 % (ref 11.5–15.5)
WBC: 4.5 10*3/uL (ref 4.0–10.5)
nRBC: 0 % (ref 0.0–0.2)

## 2021-09-27 LAB — RAD ONC ARIA SESSION SUMMARY
Course Elapsed Days: 41
Plan Fractions Treated to Date: 28
Plan Prescribed Dose Per Fraction: 1.8 Gy
Plan Total Fractions Prescribed: 28
Plan Total Prescribed Dose: 50.4 Gy
Reference Point Dosage Given to Date: 50.4 Gy
Reference Point Session Dosage Given: 1.8 Gy
Session Number: 28

## 2021-09-28 ENCOUNTER — Ambulatory Visit
Admission: RE | Admit: 2021-09-28 | Discharge: 2021-09-28 | Disposition: A | Discharge status: Home / Self Care | Payer: Medicaid Other | Source: Ambulatory Visit | Attending: Radiation Oncology | Admitting: Radiation Oncology

## 2021-09-28 ENCOUNTER — Other Ambulatory Visit: Payer: Self-pay

## 2021-09-28 ENCOUNTER — Ambulatory Visit: Payer: Medicaid Other

## 2021-09-28 DIAGNOSIS — C50912 Malignant neoplasm of unspecified site of left female breast: Secondary | ICD-10-CM | POA: Diagnosis not present

## 2021-09-28 LAB — RAD ONC ARIA SESSION SUMMARY
Course Elapsed Days: 42
Plan Fractions Treated to Date: 1
Plan Prescribed Dose Per Fraction: 2 Gy
Plan Total Fractions Prescribed: 8
Plan Total Prescribed Dose: 16 Gy
Reference Point Dosage Given to Date: 52.4 Gy
Reference Point Session Dosage Given: 2 Gy
Session Number: 29

## 2021-09-29 ENCOUNTER — Ambulatory Visit
Admission: RE | Admit: 2021-09-29 | Discharge: 2021-09-29 | Disposition: A | Discharge status: Home / Self Care | Payer: Medicaid Other | Source: Ambulatory Visit | Attending: Radiation Oncology | Admitting: Radiation Oncology

## 2021-09-29 ENCOUNTER — Other Ambulatory Visit: Payer: Self-pay

## 2021-09-29 DIAGNOSIS — C50912 Malignant neoplasm of unspecified site of left female breast: Secondary | ICD-10-CM | POA: Diagnosis not present

## 2021-09-29 LAB — RAD ONC ARIA SESSION SUMMARY
Course Elapsed Days: 43
Plan Fractions Treated to Date: 2
Plan Prescribed Dose Per Fraction: 2 Gy
Plan Total Fractions Prescribed: 8
Plan Total Prescribed Dose: 16 Gy
Reference Point Dosage Given to Date: 54.4 Gy
Reference Point Session Dosage Given: 2 Gy
Session Number: 30

## 2021-09-30 ENCOUNTER — Other Ambulatory Visit: Payer: Self-pay

## 2021-09-30 ENCOUNTER — Ambulatory Visit
Admission: RE | Admit: 2021-09-30 | Discharge: 2021-09-30 | Disposition: A | Discharge status: Home / Self Care | Payer: Medicaid Other | Source: Ambulatory Visit | Attending: Radiation Oncology | Admitting: Radiation Oncology

## 2021-09-30 DIAGNOSIS — C50912 Malignant neoplasm of unspecified site of left female breast: Secondary | ICD-10-CM | POA: Diagnosis not present

## 2021-09-30 LAB — RAD ONC ARIA SESSION SUMMARY
Course Elapsed Days: 44
Plan Fractions Treated to Date: 3
Plan Prescribed Dose Per Fraction: 2 Gy
Plan Total Fractions Prescribed: 8
Plan Total Prescribed Dose: 16 Gy
Reference Point Dosage Given to Date: 56.4 Gy
Reference Point Session Dosage Given: 2 Gy
Session Number: 31

## 2021-10-01 ENCOUNTER — Ambulatory Visit
Admission: RE | Admit: 2021-10-01 | Discharge: 2021-10-01 | Disposition: A | Discharge status: Home / Self Care | Payer: Medicaid Other | Source: Ambulatory Visit | Attending: Radiation Oncology | Admitting: Radiation Oncology

## 2021-10-01 ENCOUNTER — Other Ambulatory Visit: Payer: Self-pay

## 2021-10-01 DIAGNOSIS — C50912 Malignant neoplasm of unspecified site of left female breast: Secondary | ICD-10-CM | POA: Diagnosis not present

## 2021-10-01 LAB — RAD ONC ARIA SESSION SUMMARY
Course Elapsed Days: 45
Plan Fractions Treated to Date: 4
Plan Prescribed Dose Per Fraction: 2 Gy
Plan Total Fractions Prescribed: 8
Plan Total Prescribed Dose: 16 Gy
Reference Point Dosage Given to Date: 58.4 Gy
Reference Point Session Dosage Given: 2 Gy
Session Number: 32

## 2021-10-04 ENCOUNTER — Other Ambulatory Visit: Payer: Self-pay

## 2021-10-04 ENCOUNTER — Ambulatory Visit
Admission: RE | Admit: 2021-10-04 | Discharge: 2021-10-04 | Disposition: A | Discharge status: Home / Self Care | Payer: Medicaid Other | Source: Ambulatory Visit | Attending: Radiation Oncology | Admitting: Radiation Oncology

## 2021-10-04 DIAGNOSIS — C50912 Malignant neoplasm of unspecified site of left female breast: Secondary | ICD-10-CM | POA: Diagnosis not present

## 2021-10-04 LAB — RAD ONC ARIA SESSION SUMMARY
Course Elapsed Days: 48
Plan Fractions Treated to Date: 5
Plan Prescribed Dose Per Fraction: 2 Gy
Plan Total Fractions Prescribed: 8
Plan Total Prescribed Dose: 16 Gy
Reference Point Dosage Given to Date: 60.4 Gy
Reference Point Session Dosage Given: 2 Gy
Session Number: 33

## 2021-10-05 ENCOUNTER — Ambulatory Visit
Admission: RE | Admit: 2021-10-05 | Discharge: 2021-10-05 | Disposition: A | Discharge status: Home / Self Care | Payer: Medicaid Other | Source: Ambulatory Visit | Attending: Radiation Oncology | Admitting: Radiation Oncology

## 2021-10-05 ENCOUNTER — Other Ambulatory Visit: Payer: Self-pay

## 2021-10-05 ENCOUNTER — Other Ambulatory Visit: Payer: Self-pay | Admitting: Oncology

## 2021-10-05 DIAGNOSIS — C50912 Malignant neoplasm of unspecified site of left female breast: Secondary | ICD-10-CM | POA: Diagnosis not present

## 2021-10-05 LAB — RAD ONC ARIA SESSION SUMMARY
Course Elapsed Days: 49
Plan Fractions Treated to Date: 6
Plan Prescribed Dose Per Fraction: 2 Gy
Plan Total Fractions Prescribed: 8
Plan Total Prescribed Dose: 16 Gy
Reference Point Dosage Given to Date: 62.4 Gy
Reference Point Session Dosage Given: 2 Gy
Session Number: 34

## 2021-10-06 ENCOUNTER — Other Ambulatory Visit: Payer: Self-pay

## 2021-10-06 ENCOUNTER — Ambulatory Visit: Payer: Medicaid Other

## 2021-10-06 ENCOUNTER — Ambulatory Visit
Admission: RE | Admit: 2021-10-06 | Discharge: 2021-10-06 | Disposition: A | Discharge status: Home / Self Care | Payer: Medicaid Other | Source: Ambulatory Visit | Attending: Radiation Oncology | Admitting: Radiation Oncology

## 2021-10-06 ENCOUNTER — Inpatient Hospital Stay: Payer: Medicaid Other | Admitting: Occupational Therapy

## 2021-10-06 DIAGNOSIS — G629 Polyneuropathy, unspecified: Secondary | ICD-10-CM

## 2021-10-06 DIAGNOSIS — C50912 Malignant neoplasm of unspecified site of left female breast: Secondary | ICD-10-CM | POA: Diagnosis not present

## 2021-10-06 DIAGNOSIS — M25612 Stiffness of left shoulder, not elsewhere classified: Secondary | ICD-10-CM

## 2021-10-06 LAB — RAD ONC ARIA SESSION SUMMARY
Course Elapsed Days: 50
Plan Fractions Treated to Date: 7
Plan Prescribed Dose Per Fraction: 2 Gy
Plan Total Fractions Prescribed: 8
Plan Total Prescribed Dose: 16 Gy
Reference Point Dosage Given to Date: 64.4 Gy
Reference Point Session Dosage Given: 2 Gy
Session Number: 35
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
## 2021-10-07 ENCOUNTER — Ambulatory Visit
Admission: RE | Admit: 2021-10-07 | Discharge: 2021-10-07 | Disposition: A | Discharge status: Home / Self Care | Payer: Medicaid Other | Source: Ambulatory Visit | Attending: Radiation Oncology | Admitting: Radiation Oncology

## 2021-10-07 ENCOUNTER — Other Ambulatory Visit: Payer: Self-pay

## 2021-10-07 DIAGNOSIS — C50912 Malignant neoplasm of unspecified site of left female breast: Secondary | ICD-10-CM | POA: Diagnosis not present

## 2021-10-07 LAB — RAD ONC ARIA SESSION SUMMARY
Course Elapsed Days: 51
Plan Fractions Treated to Date: 8
Plan Prescribed Dose Per Fraction: 2 Gy
Plan Total Fractions Prescribed: 8
Plan Total Prescribed Dose: 16 Gy
Reference Point Dosage Given to Date: 66.4 Gy
Reference Point Session Dosage Given: 2 Gy
Session Number: 36

## 2021-10-22 ENCOUNTER — Encounter: Payer: Self-pay | Admitting: Internal Medicine

## 2021-10-22 ENCOUNTER — Inpatient Hospital Stay: Payer: Medicaid Other | Attending: Oncology | Admitting: Internal Medicine

## 2021-10-22 DIAGNOSIS — Z9221 Personal history of antineoplastic chemotherapy: Secondary | ICD-10-CM | POA: Diagnosis not present

## 2021-10-22 DIAGNOSIS — C50811 Malignant neoplasm of overlapping sites of right female breast: Secondary | ICD-10-CM | POA: Insufficient documentation

## 2021-10-22 DIAGNOSIS — F17210 Nicotine dependence, cigarettes, uncomplicated: Secondary | ICD-10-CM | POA: Diagnosis not present

## 2021-10-22 DIAGNOSIS — C50812 Malignant neoplasm of overlapping sites of left female breast: Secondary | ICD-10-CM | POA: Insufficient documentation

## 2021-10-22 DIAGNOSIS — G620 Drug-induced polyneuropathy: Secondary | ICD-10-CM | POA: Diagnosis present

## 2021-10-22 DIAGNOSIS — T451X5A Adverse effect of antineoplastic and immunosuppressive drugs, initial encounter: Secondary | ICD-10-CM | POA: Insufficient documentation

## 2021-10-22 DIAGNOSIS — Z170 Estrogen receptor positive status [ER+]: Secondary | ICD-10-CM | POA: Diagnosis not present

## 2021-10-22 MED ORDER — GABAPENTIN 300 MG PO CAPS
300.0000 mg | ORAL_CAPSULE | Freq: Two times a day (BID) | ORAL | 1 refills | Status: DC
Start: 2021-10-22 — End: 2021-11-26

## 2021-10-22 NOTE — Progress Notes (Signed)
[facility] [address], [place] [address] [phone]   New Patient Evaluation  Date of Service: 10/22/21 Patient Name: Briana Soto Patient MRN: [id number] Patient DOB: 1975/10/24 Provider: Ventura Sellers, MD  Identifying Statement:  [name] is a 46 y.o. female with Chemotherapy-induced neuropathy [facility]) who presents for initial consultation and evaluation regarding cancer associated neurologic deficits.    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 No pathogenic variants identified on the [facility] Multi-Cancer+RNA panel. VUS in ATM called c.1838T>G, VUS in EGFR called c.2884C>G, VUS in POLD1 called c.1072C>T, and VUS in RUNX1 called c.872T>C identified. The report date is 02/13/2021.  The Multi-Cancer Panel + RNA offered by [facility] includes sequencing and/or deletion duplication testing of the following 84 genes: AIP, ALK, APC, ATM, AXIN2,BAP1,  BARD1, BLM, BMPR1A, BRCA1, BRCA2, BRIP1, CASR, CDC73, CDH1, CDK4, CDKN1B, CDKN1C, CDKN2A (p14ARF), CDKN2A (p16INK4a), CEBPA, CHEK2, CTNNA1, DICER1, DIS3L2, EGFR (c.2369C>T, p.Thr790Met variant only), EPCAM (Deletion/duplication testing only), FH, FLCN, GATA2, GPC3, GREM1 (Promoter region  deletion/duplication testing only), HOXB13 (c.251G>A, p.Gly84Glu), HRAS, KIT, MAX, MEN1, MET, MITF (c.952G>A, p.Glu318Lys variant only), MLH1, MSH2, MSH3, MSH6, MUTYH, NBN, NF1, NF2, NTHL1, PALB2, PDGFRA, PHOX2B, PMS2, POLD1, POLE, POT1, PRKAR1A, PTCH1, PTEN, RAD50, RAD51C, RAD51D, RB1, RECQL4, RET, RUNX1, SDHAF2, SDHA (sequence changes only), SDHB, SDHC, SDHD, SMAD4, SMARCA4, SMARCB1, SMARCE1, STK11, SUFU, TERC, TERT, TMEM127, TP53, TSC1, TSC2, VHL, WRN and WT1.   Invasive ductal carcinoma of right breast ([place])  01/12/2021 Initial Diagnosis   Invasive ductal carcinoma of right breast ([place])   01/12/2021 Cancer Staging   Staging form: Breast, AJCC 8th Edition - Clinical stage from 01/12/2021: Stage IB (cT2, cN0, cM0, G2, ER+, PR+, HER2-) - Signed by Lloyd Huger, MD on 01/12/2021 Stage prefix: Initial diagnosis Histologic grading system: 3 grade system   01/21/2021 - 06/03/2021 Chemotherapy   Patient is on Treatment Plan : BREAST ADJUVANT DOSE DENSE AC q14d / PACLitaxel q7d      Genetic Testing   Negative genetic testing. No pathogenic variants identified on the [facility] Multi-Cancer+RNA panel. VUS in ATM called c.1838T>G, VUS in EGFR called c.2884C>G, VUS in POLD1 called c.1072C>T, and VUS in RUNX1 called c.872T>C identified. The report date is 02/13/2021.  The Multi-Cancer Panel + RNA offered by [facility] includes sequencing and/or deletion duplication testing of the following 84 genes: AIP, ALK, APC, ATM, AXIN2,BAP1,  BARD1, BLM, BMPR1A, BRCA1, BRCA2, BRIP1, CASR, CDC73, CDH1, CDK4, CDKN1B, CDKN1C, CDKN2A (p14ARF), CDKN2A (p16INK4a), CEBPA, CHEK2, CTNNA1, DICER1, DIS3L2, EGFR (c.2369C>T, p.Thr790Met variant only), EPCAM (Deletion/duplication testing only), FH, FLCN, GATA2, GPC3, GREM1 (Promoter region deletion/duplication testing only), HOXB13 (c.251G>A, p.Gly84Glu), HRAS, KIT, MAX, MEN1, MET, MITF (c.952G>A, p.Glu318Lys variant only), MLH1, MSH2, MSH3, MSH6, MUTYH, NBN, NF1, NF2, NTHL1, PALB2,  PDGFRA, PHOX2B, PMS2, POLD1, POLE, POT1,  PRKAR1A, PTCH1, PTEN, RAD50, RAD51C, RAD51D, RB1, RECQL4, RET, RUNX1, SDHAF2, SDHA (sequence changes only), SDHB, SDHC, SDHD, SMAD4, SMARCA4, SMARCB1, SMARCE1, STK11, SUFU, TERC, TERT, TMEM127, TP53, TSC1, TSC2, VHL, WRN and WT1.     History of Present Illness: The patient's records from the referring physician were obtained and reviewed and the patient interviewed to confirm this HPI.  [name] presents with several months history of painful burning, tingling, numbness affecting her feet and hands.  Symptoms were exacerbated during chemotherapy with taxol, stable since that time for the most part.  No other neurologic complaints.  Is struggling with anxiety which makes the symptoms worse, in her opinion.  Medications: Current Outpatient Medications on File Prior to Visit  Medication Sig Dispense Refill   acetaminophen (TYLENOL) 325 MG tablet Take 650 mg by mouth every 6 (six) hours as needed.     ALPRAZolam (XANAX) 0.25 MG tablet Take 1 tablet (0.25 mg total) by mouth at bedtime. 30 tablet 0   amLODipine (NORVASC) 10 MG tablet Take 10 mg by mouth daily.     bacitracin ointment Apply 1 application topically 2 (two) times daily. 120 g 0   diphenhydrAMINE HCl (BENADRYL ALLERGY PO) Take by mouth.     diphenhydrAMINE-zinc acetate (BENADRYL EXTRA STRENGTH) cream Apply 1 application topically 3 (three) times daily as needed for itching. 28.4 g 0   hydrochlorothiazide (HYDRODIURIL) 25 MG tablet Take 25 mg by mouth daily.     labetalol (NORMODYNE) 100 MG tablet TAKE ONE TABLET BY MOUTH TWICE DAILY 60 tablet 0   lidocaine-prilocaine (EMLA) cream Apply to affected area once 30 g 3   loratadine (CLARITIN) 10 MG tablet Take 10 mg by mouth daily.     ondansetron (ZOFRAN) 8 MG tablet Take 1 tablet (8 mg total) by mouth 2 (two) times daily. 60 tablet 2   Potassium Chloride ER 20 MEQ TBCR Take 1 tablet by mouth daily.     prednisoLONE (ORAPRED) 15 MG/5ML  solution Take by mouth.     prochlorperazine (COMPAZINE) 10 MG tablet Take 1 tablet (10 mg total) by mouth every 6 (six) hours as needed for nausea or vomiting. 60 tablet 2   silver sulfADIAZINE (SILVADENE) 1 % cream Apply 1 Application topically 2 (two) times daily. 50 g 2   tamoxifen (NOLVADEX) 20 MG tablet Take 1 tablet (20 mg total) by mouth daily. 90 tablet 3   No current facility-administered medications on file prior to visit.    Allergies:  Allergies  Allergen Reactions   Codeine Itching and Nausea And Vomiting   Past Medical History:  Past Medical History:  Diagnosis Date   Anxiety    Headache    Hypertension    Noncompliance    Past Surgical History:  Past Surgical History:  Procedure Laterality Date   BREAST BIOPSY Right 12/24/2020   mass, 9:00 8 cmfn venus marker, path pending   BREAST BIOPSY Left 12/24/2020   mass 3:00 retroareolar, ribbon, path pending   BREAST BIOPSY Left 12/24/2020   mass 3:00 5cmfn, heart marker, path pending   BREAST BIOPSY Left 12/24/2020   axilla LN, hyrdomarker shape 3 coil, path pending   BREAST LUMPECTOMY WITH RADIOFREQUENCY TAG IDENTIFICATION Right 06/24/2021   BREAST LUMPECTOMY,RADIO FREQ LOCALIZER,AXILLARY SENTINEL LYMPH NODE BIOPSY Right 07/05/2021   Procedure: BREAST LUMPECTOMY,RADIO FREQ LOCALIZER,AXILLARY SENTINEL LYMPH NODE BIOPSY;  Surgeon: Ronny Bacon, MD;  Location: [facility] ORS;  Service: General;  Laterality: Right;   MASTECTOMY MODIFIED RADICAL Left 07/05/2021   Procedure: MASTECTOMY  MODIFIED RADICAL;  Surgeon: Ronny Bacon, MD;  Location: [facility] ORS;  Service: General;  Laterality: Left;   PORTACATH PLACEMENT Right 01/01/2021   Procedure: INSERTION PORT-A-CATH;  Surgeon: Ronny Bacon, MD;  Location: [facility] ORS;  Service: General;  Laterality: Right;   TUBAL LIGATION     Social History:  Social History   Socioeconomic History   Marital status: Divorced    Spouse name: Not on file   Number of children: Not on file    Years of education: Not on file   Highest education level: Not on file  Occupational History   Not on file  Tobacco Use   Smoking status: Every Day    Packs/day: 1.50    Types: Cigarettes   Smokeless tobacco: Never  Vaping Use   Vaping Use: Never used  Substance and Sexual Activity   Alcohol use: Yes    Alcohol/week: 14.0 standard drinks of alcohol    Types: 14 Cans of beer per week    Comment: at least 2 beers daily   Drug use: No   Sexual activity: Yes    Birth control/protection: None  Other Topics Concern   Not on file  Social History Narrative   Live at home alone.   Social Determinants of Health   Financial Resource Strain: Not on file  Food Insecurity: Not on file  Transportation Needs: Not on file  Physical Activity: Not on file  Stress: Not on file  Social Connections: Not on file  Intimate Partner Violence: Not on file   Family History:  Family History  Problem Relation Age of Onset   COPD Mother    Hypertension Mother    Heart disease Father     Review of Systems: Constitutional: Doesn't report fevers, chills or abnormal weight loss Eyes: Doesn't report blurriness of vision Ears, nose, mouth, throat, and face: Doesn't report sore throat Respiratory: Doesn't report cough, dyspnea or wheezes Cardiovascular: Doesn't report palpitation, chest discomfort  Gastrointestinal:  Doesn't report nausea, constipation, diarrhea GU: Doesn't report incontinence Skin: Doesn't report skin rashes Neurological: Per HPI Musculoskeletal: Doesn't report joint pain Behavioral/Psych: Doesn't report anxiety  Physical Exam: Vitals:   10/22/21 1110  BP: (!) 139/100  Pulse: 98  Temp: (!) 96 F (35.6 C)   KPS: 80. General: Alert, cooperative, pleasant, in no acute distress Head: Normal EENT: No conjunctival injection or scleral icterus.  Lungs: Resp effort normal Cardiac: Regular rate Abdomen: Non-distended abdomen Skin: No rashes cyanosis or  petechiae. Extremities: No clubbing or edema  Neurologic Exam: Mental Status: Awake, alert, attentive to examiner. Oriented to self and environment. Language is fluent with intact comprehension.  Cranial Nerves: Visual acuity is grossly normal. Visual fields are full. Extra-ocular movements intact. No ptosis. Face is symmetric Motor: Tone and bulk are normal. Power is full in both arms and legs. Reflexes are symmetric, no pathologic reflexes present.  Sensory: Stocking neuropathic changes Gait: Normal.   Labs: I have reviewed the data as listed    Component Value Date/Time   NA 134 (L) 06/03/2021 0836   K 3.5 06/03/2021 0836   CL 103 06/03/2021 0836   CO2 23 06/03/2021 0836   GLUCOSE 125 (H) 06/03/2021 0836   BUN 7 06/03/2021 0836   CREATININE 0.73 06/03/2021 0836   CALCIUM 8.8 (L) 06/03/2021 0836   PROT 6.8 06/03/2021 0836   ALBUMIN 3.5 06/03/2021 0836   AST 21 06/03/2021 0836   ALT 12 06/03/2021 0836   ALKPHOS 68 06/03/2021 0836   BILITOT 0.1 (  L) 06/03/2021 0836   GFRNONAA >60 06/03/2021 0836   GFRAA >60 12/01/2017 0357   Lab Results  Component Value Date   WBC 4.5 09/27/2021   NEUTROABS 1.7 06/03/2021   HGB 15.7 (H) 09/27/2021   HCT 46.7 (H) 09/27/2021   MCV 96.1 09/27/2021   PLT 223 09/27/2021      Assessment/Plan Chemotherapy-induced neuropathy ([place])  [name] presents with clinical syndrome consistent with symmetric, length dependent, small and large fiber peripheral neuropathy.  Etiology is exposure to taxol chemotherapy.  We reviewed pathophysiology of chemotherapy induced neuropathy, available treatments, and goals of care.  We recommended trial of gabapentin 310m BID for symptom relief.  She is agreeable with this plan.  Recommended she reach out to therapist or counselor in network to help manage anxiety symptoms.  We spent twenty additional minutes teaching regarding the natural history, biology, and historical experience in the treatment  of neurologic complications of cancer.   We appreciate the opportunity to participate in the care of Brinn K Buhman.   All questions were answered. The patient knows to call the clinic with any problems, questions or concerns. No barriers to learning were detected.  The total time spent in the encounter was 40 minutes and more than 50% was on counseling and review of test results   ZVentura Sellers MD Medical Director of Neuro-Oncology [facility]at [place]09/01/23 12:21 PM

## 2021-11-11 ENCOUNTER — Encounter: Payer: Self-pay | Admitting: Radiation Oncology

## 2021-11-11 ENCOUNTER — Ambulatory Visit
Admission: RE | Admit: 2021-11-11 | Discharge: 2021-11-11 | Disposition: A | Discharge status: Home / Self Care | Payer: Medicaid Other | Source: Ambulatory Visit | Attending: Radiation Oncology | Admitting: Radiation Oncology

## 2021-11-11 VITALS — BP 130/93 | HR 86 | Temp 96.2°F | Resp 16 | Wt 138.6 lb

## 2021-11-11 DIAGNOSIS — C50911 Malignant neoplasm of unspecified site of right female breast: Secondary | ICD-10-CM | POA: Diagnosis present

## 2021-11-11 DIAGNOSIS — Z170 Estrogen receptor positive status [ER+]: Secondary | ICD-10-CM

## 2021-11-11 DIAGNOSIS — Z923 Personal history of irradiation: Secondary | ICD-10-CM | POA: Diagnosis not present

## 2021-11-11 DIAGNOSIS — G629 Polyneuropathy, unspecified: Secondary | ICD-10-CM | POA: Diagnosis not present

## 2021-11-11 NOTE — Progress Notes (Signed)
Radiation Oncology Follow up Note  Name: Briana Soto   Date:   11/11/2021 MRN:  [id number] DOB: 11-19-1975    This 46 y.o. female presents to the clinic today for 1 month follow-up status post radiation therapy to her right breast as well as her left chest wall for right breast stage Ia invasive mammary carcinoma and left breast stage T2 N1 M0 invasive mammary carcinoma status post left modified radical mastectomy.  REFERRING PROVIDER: Carrolyn Meiers*  HPI: Patient is a 46 year old female now at 1 month having completed radiation therapy to her right breast as well as her left chest wall for above described invasive mammary carcinoma.  Seen today in routine follow-up she is doing well still complaining of some peripheral neuropathy.  She specifically denies any breast tenderness any new nodularity masses in her chest wall bone pain cough..  She is on gabapentin for her peripheral neuropathy.  Has started tamoxifen tolerating it well without side effect.  COMPLICATIONS OF TREATMENT: none  FOLLOW UP COMPLIANCE: keeps appointments   PHYSICAL EXAM:  BP (!) 130/93 (BP Location: Right Arm, Patient Position: Sitting)   Pulse 86   Temp (!) 96.2 F (35.7 C) (Tympanic)   Resp 16   Wt 138 lb 9.6 oz (62.9 kg)   SpO2 100%   BMI 23.06 kg/m  Patient is status post left modified radical mastectomy.  Chest wall is clear.  Right breast is free of dominant mass.  No axillary or supraclavicular adenopathy is identified.  Skin remains hyperpigmented with some dry desquamation noted.  Well-developed well-nourished patient in NAD. HEENT reveals PERLA, EOMI, discs not visualized.  Oral cavity is clear. No oral mucosal lesions are identified. Neck is clear without evidence of cervical or supraclavicular adenopathy. Lungs are clear to A&P. Cardiac examination is essentially unremarkable with regular rate and rhythm without murmur rub or thrill. Abdomen is benign with no organomegaly or masses noted.  Motor sensory and DTR levels are equal and symmetric in the upper and lower extremities. Cranial nerves II through XII are grossly intact. Proprioception is intact. No peripheral adenopathy or edema is identified. No motor or sensory levels are noted. Crude visual fields are within normal range.  RADIOLOGY RESULTS: No current films for review  PLAN: Patient is doing well 1 month out from bilateral radiation to her left chest wall as well as right breast.  Pleased with overall progress.  Of asked to see her back in 4 to 5 months for follow-up.  Patient is to call with any concerns.  I would like to take this opportunity to thank you for allowing me to participate in the care of your patient.Noreene Filbert, MD

## 2021-11-12 ENCOUNTER — Telehealth: Payer: Self-pay

## 2021-11-12 NOTE — Telephone Encounter (Signed)
Faxed medical records to Disability Determination Services at [phone].

## 2021-11-22 NOTE — Progress Notes (Signed)
[facility]  Telephone:([phone] [phone]  ID: [name] OB: [birth year]  MR#: [id number]  XNA#:[id number]  Patient Care Team: Carrolyn Meiers, MD as PCP - General (Internal Medicine) Rico Junker, RN as Registered Nurse Theodore Demark, RN (Inactive) as Registered Nurse  CHIEF COMPLAINT: Bilateral ER/PR positive, HER2 negative breast cancer.  INTERVAL HISTORY: Patient returns to clinic today for further evaluation and to assess her toleration of tamoxifen.  She is tolerating treatment well without significant side effects.  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  She has no other neurologic complaints.  She denies any recent fevers or illnesses.  She has a good appetite and denies weight loss.  She has no chest pain, shortness of breath, cough, or hemoptysis.  She denies any nausea, vomiting, constipation, or diarrhea.  She has no urinary complaints.  Patient offers no further specific complaints today.  REVIEW OF SYSTEMS:   Review of Systems  Constitutional: Negative.  Negative for chills, fever and malaise/fatigue.  Respiratory: Negative.  Negative for cough, hemoptysis and shortness of breath.   Cardiovascular: Negative.  Negative for chest pain and leg swelling.  Gastrointestinal: Negative.  Negative for abdominal pain and nausea.  Genitourinary: Negative.  Negative for dysuria.  Musculoskeletal: Negative.  Negative for back pain.  Skin: Negative.  Negative for rash.  Neurological:  Positive for tingling and sensory change. Negative for dizziness, focal weakness, weakness and headaches.  Psychiatric/Behavioral: Negative.  The patient is not nervous/anxious and does not have insomnia.     As per HPI. Otherwise, a complete review of systems is negative.  PAST MEDICAL HISTORY: Past Medical History:  Diagnosis Date   Anxiety    Headache    Hypertension    Noncompliance     PAST SURGICAL HISTORY: Past  Surgical History:  Procedure Laterality Date   BREAST BIOPSY Right 12/24/2020   mass, 9:00 8 cmfn venus marker, path pending   BREAST BIOPSY Left 12/24/2020   mass 3:00 retroareolar, ribbon, path pending   BREAST BIOPSY Left 12/24/2020   mass 3:00 5cmfn, heart marker, path pending   BREAST BIOPSY Left 12/24/2020   axilla LN, hyrdomarker shape 3 coil, path pending   BREAST LUMPECTOMY WITH RADIOFREQUENCY TAG IDENTIFICATION Right 06/24/2021   BREAST LUMPECTOMY,RADIO FREQ LOCALIZER,AXILLARY SENTINEL LYMPH NODE BIOPSY Right 07/05/2021   Procedure: BREAST LUMPECTOMY,RADIO FREQ LOCALIZER,AXILLARY SENTINEL LYMPH NODE BIOPSY;  Surgeon: Ronny Bacon, MD;  Location: [facility] ORS;  Service: General;  Laterality: Right;   MASTECTOMY MODIFIED RADICAL Left 07/05/2021   Procedure: MASTECTOMY MODIFIED RADICAL;  Surgeon: Ronny Bacon, MD;  Location: [facility] OR[facility];  Service: General;  Laterality: Left;   PORTACATH PLACEMENT Right 01/01/2021   Procedure: INSERTION PORT-A-CATH;  Surgeon: Ronny Bacon, MD;  Location: [facility] ORS;  Service: General;  Laterality: Right;   TUBAL LIGATION      FAMILY HISTORY: Family History  Problem Relation Age of Onset   COPD Mother    Hypertension Mother    Heart disease Father     ADVANCED DIRECTIVES (Y/N):  N  HEALTH MAINTENANCE: Social History   Tobacco Use   Smoking status: Every Day    Packs/day: 1.50    Types: Cigarettes   Smokeless tobacco: Never  Vaping Use   Vaping Use: Never used  Substance Use Topics   Alcohol use: Yes    Alcohol/week: 14.0 standard drinks of alcohol    Types: 14 Cans of beer per week    Comment: at least 2 beers  daily   Drug use: No     Colonoscopy:  PAP:  Bone density:  Lipid panel:  Allergies  Allergen Reactions   Codeine Itching and Nausea And Vomiting    Current Outpatient Medications  Medication Sig Dispense Refill   acetaminophen (TYLENOL) 325 MG tablet Take 650 mg by mouth every 6 (six) hours as needed.      ALPRAZolam (XANAX) 0.25 MG tablet Take 1 tablet (0.25 mg total) by mouth at bedtime. 30 tablet 0   amLODipine (NORVASC) 10 MG tablet Take 10 mg by mouth daily.     bacitracin ointment Apply 1 application topically 2 (two) times daily. 120 g 0   diphenhydrAMINE HCl (BENADRYL ALLERGY PO) Take by mouth.     diphenhydrAMINE-zinc acetate (BENADRYL EXTRA STRENGTH) cream Apply 1 application topically 3 (three) times daily as needed for itching. 28.4 g 0   gabapentin (NEURONTIN) 300 MG capsule Take 1 capsule (300 mg total) by mouth 2 (two) times daily. 120 capsule 1   hydrochlorothiazide (HYDRODIURIL) 25 MG tablet Take 25 mg by mouth daily.     labetalol (NORMODYNE) 100 MG tablet TAKE ONE TABLET BY MOUTH TWICE DAILY 60 tablet 0   lidocaine-prilocaine (EMLA) cream Apply to affected area once 30 g 3   loratadine (CLARITIN) 10 MG tablet Take 10 mg by mouth daily.     Potassium Chloride ER 20 MEQ TBCR Take 1 tablet by mouth daily.     prednisoLONE (ORAPRED) 15 MG/5ML solution Take by mouth.     tamoxifen (NOLVADEX) 20 MG tablet Take 1 tablet (20 mg total) by mouth daily. 90 tablet 3   Vitamin D, Ergocalciferol, 50000 units CAPS Take 1 capsule by mouth once a week.     ondansetron (ZOFRAN) 8 MG tablet Take 1 tablet (8 mg total) by mouth 2 (two) times daily. (Patient not taking: Reported on 11/11/2021) 60 tablet 2   prochlorperazine (COMPAZINE) 10 MG tablet Take 1 tablet (10 mg total) by mouth every 6 (six) hours as needed for nausea or vomiting. (Patient not taking: Reported on 11/11/2021) 60 tablet 2   silver sulfADIAZINE (SILVADENE) 1 % cream Apply 1 Application topically 2 (two) times daily. (Patient not taking: Reported on 11/11/2021) 50 g 2   No current facility-administered medications for this visit.    OBJECTIVE: Vitals:   11/23/21 1004  BP: (!) 149/117  Pulse: 100  Resp: 16  Temp: (!) 97.5 F (36.4 C)  SpO2: 100%      Body mass index is 23.13 kg/m.    ECOG FS:0 - Asymptomatic  General:  Well-developed, well-nourished, no acute distress. Eyes: Pink conjunctiva, anicteric sclera. HEENT: Normocephalic, moist mucous membranes. Breast: Exam deferred today. Lungs: No audible wheezing or coughing. Heart: Regular rate and rhythm. Abdomen: Soft, nontender, no obvious distention. Musculoskeletal: No edema, cyanosis, or clubbing. Neuro: Alert, answering all questions appropriately. Cranial nerves grossly intact. Skin: No rashes or petechiae noted. Psych: Normal affect.  LAB RESULTS:  Lab Results  Component Value Date   NA 134 (L) 06/03/2021   K 3.5 06/03/2021   CL 103 06/03/2021   CO2 23 06/03/2021   GLUCOSE 125 (H) 06/03/2021   BUN 7 06/03/2021   CREATININE 0.73 06/03/2021   CALCIUM 8.8 (L) 06/03/2021   PROT 6.8 06/03/2021   ALBUMIN 3.5 06/03/2021   AST 21 06/03/2021   ALT 12 06/03/2021   ALKPHOS 68 06/03/2021   BILITOT 0.1 (L) 06/03/2021   GFRNONAA >60 06/03/2021   GFRAA >60 12/01/2017  Lab Results  Component Value Date   WBC 4.5 09/27/2021   NEUTROABS 1.7 06/03/2021   HGB 15.7 (H) 09/27/2021   HCT 46.7 (H) 09/27/2021   MCV 96.1 09/27/2021   PLT 223 09/27/2021     STUDIES: No results found.  ASSESSMENT: Bilateral ER/PR positive, HER2 negative breast cancer.  PLAN:    1.  Bilateral ER/PR positive, HER2 negative breast cancer: Patient completed neoadjuvant chemotherapy using Adriamycin, Cytoxan, followed by weekly Taxol on June 03, 2021.  She subsequently underwent left mastectomy, and right lumpectomy with residual malignancy in both breasts.  Right side had negative lymph nodes, left axillary dissection revealed 2 of 26 lymph nodes positive for disease.  Patient completed her adjuvant XRT in approximately September 2023.  She initiated tamoxifen and will take a minimum of 5 years of treatment completing in September 2028, but given her high risk disease may extend treatment to 7 to 10 years.  No further intervention is needed.  Return to clinic in 6  months for routine evaluation.       2.  Nausea: Resolved.  3.  Anxiety/insomnia: Patient does not complain of this today.  She was previously instructed that she can increase her alprazolam to 0.25 mg to 2 tabs as needed.  All further refills should come from primary care. 4.  Pain: Patient does not complain of this today.   5.  Hypokalemia: Resolved. 6.  Hypertension: Significantly improved since obtaining a primary care physician. 7.  Hyponatremia: Patient's most recent sodium level was 134. 8.  Peripheral neuropathy: Chronic and unchanged.  She was previously given referral to Occupational Therapy and neurooncology. 9.  Polycythemia: Mild, monitor.   Patient expressed understanding and was in agreement with this plan. She also understands that She can call clinic at any time with any questions, concerns, or complaints.    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HCC) Staging form: Breast, AJCC 8th Edition - Clinical stage from 01/12/2021: Stage IIB (cT2, cN1, cM0, G3, ER+, PR+, HER2-) - Signed by Lloyd Huger, MD on 01/12/2021 Stage prefix: Initial diagnosis Histologic grading system: 3 grade system  Invasive ductal carcinoma of right breast [facility]) Staging form: Breast, AJCC 8th Edition - Clinical stage from 01/12/2021: Stage IB (cT2, cN0, cM0, G2, ER+, PR+, HER2-) - Signed by Lloyd Huger, MD on 01/12/2021 Stage prefix: Initial diagnosis Histologic grading system: 3 grade system  Lloyd Huger, MD   11/23/2021 12:11 PM

## 2021-11-23 ENCOUNTER — Inpatient Hospital Stay: Payer: Medicaid Other | Attending: Oncology | Admitting: Oncology

## 2021-11-23 ENCOUNTER — Encounter: Payer: Self-pay | Admitting: Oncology

## 2021-11-23 VITALS — BP 149/117 | HR 100 | Temp 97.5°F | Resp 16 | Ht 65.0 in | Wt 139.0 lb

## 2021-11-23 DIAGNOSIS — T451X5D Adverse effect of antineoplastic and immunosuppressive drugs, subsequent encounter: Secondary | ICD-10-CM | POA: Diagnosis not present

## 2021-11-23 DIAGNOSIS — C50911 Malignant neoplasm of unspecified site of right female breast: Secondary | ICD-10-CM | POA: Diagnosis not present

## 2021-11-23 DIAGNOSIS — Z170 Estrogen receptor positive status [ER+]: Secondary | ICD-10-CM | POA: Insufficient documentation

## 2021-11-23 DIAGNOSIS — C50811 Malignant neoplasm of overlapping sites of right female breast: Secondary | ICD-10-CM | POA: Diagnosis present

## 2021-11-23 DIAGNOSIS — C50912 Malignant neoplasm of unspecified site of left female breast: Secondary | ICD-10-CM | POA: Diagnosis not present

## 2021-11-23 DIAGNOSIS — F17210 Nicotine dependence, cigarettes, uncomplicated: Secondary | ICD-10-CM | POA: Insufficient documentation

## 2021-11-23 DIAGNOSIS — G620 Drug-induced polyneuropathy: Secondary | ICD-10-CM | POA: Insufficient documentation

## 2021-11-23 DIAGNOSIS — E871 Hypo-osmolality and hyponatremia: Secondary | ICD-10-CM | POA: Insufficient documentation

## 2021-11-23 DIAGNOSIS — I10 Essential (primary) hypertension: Secondary | ICD-10-CM | POA: Diagnosis not present

## 2021-11-23 DIAGNOSIS — Z79810 Long term (current) use of selective estrogen receptor modulators (SERMs): Secondary | ICD-10-CM | POA: Diagnosis not present

## 2021-11-23 DIAGNOSIS — Z9221 Personal history of antineoplastic chemotherapy: Secondary | ICD-10-CM | POA: Insufficient documentation

## 2021-11-23 DIAGNOSIS — C50812 Malignant neoplasm of overlapping sites of left female breast: Secondary | ICD-10-CM | POA: Insufficient documentation

## 2021-11-23 DIAGNOSIS — D751 Secondary polycythemia: Secondary | ICD-10-CM | POA: Insufficient documentation

## 2021-11-25 ENCOUNTER — Telehealth: Payer: Self-pay

## 2021-11-25 NOTE — Telephone Encounter (Signed)
Re-faxed medical records to Disability Determination Services at [phone].

## 2021-11-26 ENCOUNTER — Telehealth: Payer: Self-pay | Admitting: *Deleted

## 2021-11-26 ENCOUNTER — Inpatient Hospital Stay (HOSPITAL_BASED_OUTPATIENT_CLINIC_OR_DEPARTMENT_OTHER): Payer: Medicaid Other | Admitting: Internal Medicine

## 2021-11-26 DIAGNOSIS — Z170 Estrogen receptor positive status [ER+]: Secondary | ICD-10-CM

## 2021-11-26 DIAGNOSIS — T451X5D Adverse effect of antineoplastic and immunosuppressive drugs, subsequent encounter: Secondary | ICD-10-CM

## 2021-11-26 DIAGNOSIS — C50811 Malignant neoplasm of overlapping sites of right female breast: Secondary | ICD-10-CM

## 2021-11-26 DIAGNOSIS — C50812 Malignant neoplasm of overlapping sites of left female breast: Secondary | ICD-10-CM

## 2021-11-26 DIAGNOSIS — G620 Drug-induced polyneuropathy: Secondary | ICD-10-CM | POA: Diagnosis not present

## 2021-11-26 MED ORDER — GABAPENTIN 300 MG PO CAPS
600.0000 mg | ORAL_CAPSULE | Freq: Two times a day (BID) | ORAL | 1 refills | Status: DC
Start: 2021-11-26 — End: 2022-02-18

## 2021-11-26 MED ORDER — DULOXETINE HCL 30 MG PO CPEP: 30.0000 mg | ORAL_CAPSULE | Freq: Every day | ORAL | 1 refills | Status: DC

## 2021-11-26 NOTE — Telephone Encounter (Signed)
Pharmacy called reporting that there is an interaction with Duloxetine and Tamoxifen where it can reduce the effectiveness of Tamoxifen and are asking if you want to order something different or if it is OK to go ahead and dispense the Duloxetine. Please advise

## 2021-11-26 NOTE — Progress Notes (Signed)
I connected with Briana Soto on 11/26/21 at 10:30 AM EDT by telephone visit and verified that I am speaking with the correct person using two identifiers.  I discussed the limitations, risks, security and privacy concerns of performing an evaluation and management service by telemedicine and the availability of in-person appointments. I also discussed with the patient that there may be a patient responsible charge related to this service. The patient expressed understanding and agreed to proceed.  Other persons participating in the visit and their role in the encounter:  n/a  Patient's location:  Home Provider's location:  Office Chief Complaint:  Chemotherapy-induced neuropathy ([place])  History of Present Ilness: Briana Soto reports no clinical changes today, neuropathy burning symptoms in her feet have not improved at all with the gabapentin '300mg'$  twice per day.  No other new or progressive changes.  Hasn't been set up yet with a counselor as discussed for anxiety symptoms. Observations: Language and cognition at baseline  Assessment and Plan: Chemotherapy-induced neuropathy (HCC)  Refractory thus far to '300mg'$  BID gabapentin.    Recommended increasing to '600mg'$  BID and adding cymbalta '30mg'$  daily.  She is agreeable with this.  No other anti-depressant currently on her medication list.  Follow Up Instructions: RTC virtual/phone in 6 weeks  I discussed the assessment and treatment plan with the patient.  The patient was provided an opportunity to ask questions and all were answered.  The patient agreed with the plan and demonstrated understanding of the instructions.    The patient was advised to call back or seek an in-person evaluation if the symptoms worsen or if the condition fails to improve as anticipated.    Ventura Sellers, MD   I provided 15 minutes of non face-to-face telephone visit time during this encounter, and > 50% was spent counseling as documented under my  assessment & plan.

## 2021-11-26 NOTE — Telephone Encounter (Signed)
Call returned to pharmacy and informed per verbal order Dr Mickeal Skinner to go ahead and fill the Duloxetine rx

## 2021-12-03 ENCOUNTER — Telehealth: Payer: Medicaid Other | Admitting: Internal Medicine

## 2022-01-06 ENCOUNTER — Telehealth: Payer: Self-pay | Admitting: *Deleted

## 2022-01-06 ENCOUNTER — Other Ambulatory Visit: Payer: Self-pay | Admitting: *Deleted

## 2022-01-06 DIAGNOSIS — C50911 Malignant neoplasm of unspecified site of right female breast: Secondary | ICD-10-CM

## 2022-01-06 DIAGNOSIS — C50811 Malignant neoplasm of overlapping sites of right female breast: Secondary | ICD-10-CM

## 2022-01-06 NOTE — Telephone Encounter (Signed)
Patient called to report that she has a new lump under her right arm. She said  she has already had one lump removed and would like to see Dr. Grayland Ormond about this new lump.

## 2022-01-06 NOTE — Telephone Encounter (Addendum)
[place] order put in. Ledell Noss will you schedule please?

## 2022-01-07 ENCOUNTER — Inpatient Hospital Stay: Payer: Medicaid Other | Attending: Oncology | Admitting: Internal Medicine

## 2022-01-07 ENCOUNTER — Telehealth: Payer: Self-pay | Admitting: *Deleted

## 2022-01-07 ENCOUNTER — Other Ambulatory Visit: Payer: Self-pay

## 2022-01-07 ENCOUNTER — Encounter: Payer: Self-pay | Admitting: Internal Medicine

## 2022-01-07 DIAGNOSIS — T451X5A Adverse effect of antineoplastic and immunosuppressive drugs, initial encounter: Secondary | ICD-10-CM | POA: Diagnosis not present

## 2022-01-07 DIAGNOSIS — G620 Drug-induced polyneuropathy: Secondary | ICD-10-CM | POA: Diagnosis not present

## 2022-01-07 MED ORDER — DULOXETINE HCL 40 MG PO CPEP: 40.0000 mg | ORAL_CAPSULE | Freq: Every day | ORAL | 5 refills | Status: AC

## 2022-01-07 MED ORDER — DULOXETINE HCL 40 MG PO CPEP: 30.0000 mg | ORAL_CAPSULE | Freq: Every day | ORAL | 5 refills | Status: DC

## 2022-01-07 NOTE — Progress Notes (Signed)
Patient states that her neuropathy has improved since starting gabapentin.

## 2022-01-07 NOTE — Telephone Encounter (Signed)
Pharmacy called needing clarification for the Duloxetine ordered today 40 mg tablet with directions to take 30 mg daily. Please clarify

## 2022-01-07 NOTE — Progress Notes (Signed)
I connected with Briana Soto on 01/07/22 at 11:30 AM EST by telephone visit and verified that I am speaking with the correct person using two identifiers.  I discussed the limitations, risks, security and privacy concerns of performing an evaluation and management service by telemedicine and the availability of in-person appointments. I also discussed with the patient that there may be a patient responsible charge related to this service. The patient expressed understanding and agreed to proceed.  Other persons participating in the visit and their role in the encounter:  n/a  Patient's location:  Home Provider's location:  Office Chief Complaint:  Chemotherapy-induced neuropathy ([place])  History of Present Ilness: Briana Soto reports clear improvement in neuropathy symptoms since adding the cymbalta.  She also feels improved with regards to anxiety.  Still complains of moderate pain in hands, feet.  Observations: Language and cognition at baseline  Assessment and Plan: Chemotherapy-induced neuropathy (HCC)  Clinically improved, some residual symptoms.  Will increase cymbalta to '40mg'$  daily.  Con't gabapentin '300mg'$  BID as prior.  Follow Up Instructions: RTC as needed  I discussed the assessment and treatment plan with the patient.  The patient was provided an opportunity to ask questions and all were answered.  The patient agreed with the plan and demonstrated understanding of the instructions.    The patient was advised to call back or seek an in-person evaluation if the symptoms worsen or if the condition fails to improve as anticipated.    Ventura Sellers, MD   I provided 15 minutes of non face-to-face telephone visit time during this encounter, and > 50% was spent counseling as documented under my assessment & plan.

## 2022-01-10 ENCOUNTER — Other Ambulatory Visit: Payer: Self-pay | Admitting: Internal Medicine

## 2022-01-10 NOTE — Telephone Encounter (Signed)
Dose increase occurred.  This refill is no longer valid.

## 2022-01-17 ENCOUNTER — Other Ambulatory Visit: Payer: Self-pay | Admitting: Internal Medicine

## 2022-01-26 ENCOUNTER — Other Ambulatory Visit: Payer: Self-pay | Admitting: Oncology

## 2022-01-26 ENCOUNTER — Ambulatory Visit
Admission: RE | Admit: 2022-01-26 | Discharge: 2022-01-26 | Disposition: A | Discharge status: Home / Self Care | Payer: Medicaid Other | Source: Ambulatory Visit | Attending: Oncology | Admitting: Oncology

## 2022-01-26 DIAGNOSIS — C50911 Malignant neoplasm of unspecified site of right female breast: Secondary | ICD-10-CM

## 2022-01-26 DIAGNOSIS — R9230 Dense breasts, unspecified: Secondary | ICD-10-CM | POA: Diagnosis not present

## 2022-01-26 DIAGNOSIS — C50912 Malignant neoplasm of unspecified site of left female breast: Secondary | ICD-10-CM | POA: Diagnosis present

## 2022-01-26 DIAGNOSIS — R928 Other abnormal and inconclusive findings on diagnostic imaging of breast: Secondary | ICD-10-CM | POA: Insufficient documentation

## 2022-01-26 DIAGNOSIS — Z1239 Encounter for other screening for malignant neoplasm of breast: Secondary | ICD-10-CM | POA: Insufficient documentation

## 2022-01-26 DIAGNOSIS — Z170 Estrogen receptor positive status [ER+]: Secondary | ICD-10-CM | POA: Insufficient documentation

## 2022-01-28 ENCOUNTER — Encounter: Payer: Self-pay | Admitting: Oncology

## 2022-01-28 ENCOUNTER — Inpatient Hospital Stay: Payer: Medicaid Other | Attending: Oncology | Admitting: Oncology

## 2022-01-28 VITALS — BP 162/124 | HR 107 | Temp 98.3°F | Resp 16 | Ht 65.0 in | Wt 138.0 lb

## 2022-01-28 DIAGNOSIS — C50811 Malignant neoplasm of overlapping sites of right female breast: Secondary | ICD-10-CM | POA: Insufficient documentation

## 2022-01-28 DIAGNOSIS — C50812 Malignant neoplasm of overlapping sites of left female breast: Secondary | ICD-10-CM | POA: Diagnosis present

## 2022-01-28 DIAGNOSIS — C50911 Malignant neoplasm of unspecified site of right female breast: Secondary | ICD-10-CM | POA: Diagnosis not present

## 2022-01-28 DIAGNOSIS — Z79810 Long term (current) use of selective estrogen receptor modulators (SERMs): Secondary | ICD-10-CM | POA: Insufficient documentation

## 2022-01-28 DIAGNOSIS — Z170 Estrogen receptor positive status [ER+]: Secondary | ICD-10-CM | POA: Insufficient documentation

## 2022-01-28 DIAGNOSIS — Z9221 Personal history of antineoplastic chemotherapy: Secondary | ICD-10-CM | POA: Insufficient documentation

## 2022-01-28 DIAGNOSIS — F17210 Nicotine dependence, cigarettes, uncomplicated: Secondary | ICD-10-CM | POA: Diagnosis not present

## 2022-01-28 DIAGNOSIS — C50912 Malignant neoplasm of unspecified site of left female breast: Secondary | ICD-10-CM | POA: Diagnosis not present

## 2022-01-28 DIAGNOSIS — T451X5D Adverse effect of antineoplastic and immunosuppressive drugs, subsequent encounter: Secondary | ICD-10-CM | POA: Insufficient documentation

## 2022-01-28 DIAGNOSIS — I10 Essential (primary) hypertension: Secondary | ICD-10-CM | POA: Insufficient documentation

## 2022-01-28 DIAGNOSIS — Z923 Personal history of irradiation: Secondary | ICD-10-CM | POA: Diagnosis not present

## 2022-01-28 DIAGNOSIS — G620 Drug-induced polyneuropathy: Secondary | ICD-10-CM | POA: Diagnosis not present

## 2022-01-28 NOTE — Progress Notes (Signed)
[facility]  [phone] [phone]  ID: Briana Soto OB: 06/01/1975  MR#: [id number]  CSN#:[id number]  Patient Care Team: Fanta, Tesfaye Demissie, MD as PCP - General (Internal Medicine) Lambert, Sheena M, RN as Registered Nurse Shaver, Anne F, RN (Inactive) as Registered Nurse  CHIEF COMPLAINT: Bilateral ER/PR positive, HER2 negative breast cancer.  INTERVAL HISTORY: Patient returns to clinic today for routine 3-month evaluation.  She currently feels well and is asymptomatic.  She is tolerating tamoxifen without significant side effects.  Her peripheral neuropathy is significantly improved with recent adjustment of her medications.  She denies any recent fevers or illnesses.  She has a good appetite and denies weight loss.  She has no chest pain, shortness of breath, cough, or hemoptysis.  She denies any nausea, vomiting, constipation, or diarrhea.  She has no urinary complaints.  Patient offers no specific complaints today.  REVIEW OF SYSTEMS:   Review of Systems  Constitutional: Negative.  Negative for chills, fever and malaise/fatigue.  Respiratory: Negative.  Negative for cough, hemoptysis and shortness of breath.   Cardiovascular: Negative.  Negative for chest pain and leg swelling.  Gastrointestinal: Negative.  Negative for abdominal pain and nausea.  Genitourinary: Negative.  Negative for dysuria.  Musculoskeletal: Negative.  Negative for back pain.  Skin: Negative.  Negative for rash.  Neurological:  Positive for tingling and sensory change. Negative for dizziness, focal weakness, weakness and headaches.  Psychiatric/Behavioral: Negative.  The patient is not nervous/anxious and does not have insomnia.     As per HPI. Otherwise, a complete review of systems is negative.  PAST MEDICAL HISTORY: Past Medical History:  Diagnosis Date   Anxiety    Headache    Hypertension    Noncompliance     PAST SURGICAL HISTORY: Past Surgical  History:  Procedure Laterality Date   BREAST BIOPSY Right 12/24/2020   mass, 9:00 8 cmfn venus marker, [facility]   BREAST BIOPSY Left 12/24/2020   mass 3:00 retroareolar, ribbon, PREDOMINANTLY KERATINACEOUS DEBRIS.   BREAST BIOPSY Left 12/24/2020   mass 3:00 5cmfn, heart marker, INVASIVE MAMMARY CARCINOMA   BREAST BIOPSY Left 12/24/2020   axilla LN, hyrdomarker shape 3 coil, METASTATIC CARCINOMA   BREAST LUMPECTOMY WITH RADIOFREQUENCY TAG IDENTIFICATION Right 06/24/2021   BREAST LUMPECTOMY,RADIO FREQ LOCALIZER,AXILLARY SENTINEL LYMPH NODE BIOPSY Right 07/05/2021   Procedure: BREAST LUMPECTOMY,RADIO FREQ LOCALIZER,AXILLARY SENTINEL LYMPH NODE BIOPSY;  Surgeon: Rodenberg, Denny, MD;  Location: [facility] OR[facility];  Service: General;  Laterality: Right;   MASTECTOMY MODIFIED RADICAL Left 07/05/2021   Procedure: MASTECTOMY MODIFIED RADICAL;  Surgeon: Rodenberg, Denny, MD;  Location: [facility] OR[facility];  Service: General;  Laterality: Left;   PORTACATH PLACEMENT Right 01/01/2021   Procedure: INSERTION PORT-A-CATH;  Surgeon: Rodenberg, Denny, MD;  Location: [facility] OR[facility];  Service: General;  Laterality: Right;   TUBAL LIGATION      FAMILY HISTORY: Family History  Problem Relation Age of Onset   COPD Mother    Hypertension Mother    Heart disease Father     ADVANCED DIRECTIVES (Y/N):  N  HEALTH MAINTENANCE: Social History   Tobacco Use   Smoking status: Every Day    Packs/day: 1.50    Types: Cigarettes   Smokeless tobacco: Never  Vaping Use   Vaping Use: Never used  Substance Use Topics   Alcohol use: Yes    Alcohol/week: 14.0 standard drinks of alcohol    Types: 14 Cans of beer per week    Comment: at least 2 beers daily     Drug use: No     Colonoscopy:  PAP:  Bone density:  Lipid panel:  Allergies  Allergen Reactions   Codeine Itching and Nausea And Vomiting    Current Outpatient Medications  Medication Sig Dispense Refill   acetaminophen (TYLENOL) 325 MG tablet Take 650 mg by mouth every 6  (six) hours as needed.     ALPRAZolam (XANAX) 0.25 MG tablet Take 1 tablet (0.25 mg total) by mouth at bedtime. 30 tablet 0   amLODipine (NORVASC) 10 MG tablet Take 10 mg by mouth daily.     bacitracin ointment Apply 1 application topically 2 (two) times daily. 120 g 0   diphenhydrAMINE HCl (BENADRYL ALLERGY PO) Take by mouth.     diphenhydrAMINE-zinc acetate (BENADRYL EXTRA STRENGTH) cream Apply 1 application topically 3 (three) times daily as needed for itching. 28.4 g 0   DULoxetine HCl 40 MG CPEP Take 40 mg by mouth daily. 60 capsule 5   gabapentin (NEURONTIN) 300 MG capsule Take 2 capsules (600 mg total) by mouth 2 (two) times daily. 120 capsule 1   hydrochlorothiazide (HYDRODIURIL) 25 MG tablet Take 25 mg by mouth daily.     labetalol (NORMODYNE) 100 MG tablet TAKE ONE TABLET BY MOUTH TWICE DAILY 60 tablet 0   lidocaine-prilocaine (EMLA) cream Apply to affected area once 30 g 3   loratadine (CLARITIN) 10 MG tablet Take 10 mg by mouth daily.     tamoxifen (NOLVADEX) 20 MG tablet Take 1 tablet (20 mg total) by mouth daily. 90 tablet 3   Vitamin D, Ergocalciferol, 50000 units CAPS Take 1 capsule by mouth once a week.     ondansetron (ZOFRAN) 8 MG tablet Take 1 tablet (8 mg total) by mouth 2 (two) times daily. (Patient not taking: Reported on 11/11/2021) 60 tablet 2   Potassium Chloride ER 20 MEQ TBCR Take 1 tablet by mouth daily. (Patient not taking: Reported on 01/28/2022)     prednisoLONE (ORAPRED) 15 MG/5ML solution Take by mouth. (Patient not taking: Reported on 01/28/2022)     prochlorperazine (COMPAZINE) 10 MG tablet Take 1 tablet (10 mg total) by mouth every 6 (six) hours as needed for nausea or vomiting. (Patient not taking: Reported on 11/11/2021) 60 tablet 2   silver sulfADIAZINE (SILVADENE) 1 % cream Apply 1 Application topically 2 (two) times daily. (Patient not taking: Reported on 11/11/2021) 50 g 2   No current facility-administered medications for this visit.     OBJECTIVE: Vitals:   01/28/22 1004  BP: (!) 162/124  Pulse: (!) 107  Resp: 16  Temp: 98.3 F (36.8 C)  SpO2: 99%      Body mass index is 22.96 kg/m.    ECOG FS:0 - Asymptomatic  General: Well-developed, well-nourished, no acute distress. Eyes: Pink conjunctiva, anicteric sclera. HEENT: Normocephalic, moist mucous membranes. Breast: Well-healed right lumpectomy scar.  Patient noted to have a keloid in the right axilla. Lungs: No audible wheezing or coughing. Heart: Regular rate and rhythm. Abdomen: Soft, nontender, no obvious distention. Musculoskeletal: No edema, cyanosis, or clubbing. Neuro: Alert, answering all questions appropriately. Cranial nerves grossly intact. Skin: No rashes or petechiae noted. Psych: Normal affect.   LAB RESULTS:  Lab Results  Component Value Date   NA 134 (L) 06/03/2021   K 3.5 06/03/2021   CL 103 06/03/2021   CO2 23 06/03/2021   GLUCOSE 125 (H) 06/03/2021   BUN 7 06/03/2021   CREATININE 0.73 06/03/2021   CALCIUM 8.8 (L) 06/03/2021   PROT 6.8 06/03/2021  ALBUMIN 3.5 06/03/2021   AST 21 06/03/2021   ALT 12 06/03/2021   ALKPHOS 68 06/03/2021   BILITOT 0.1 (L) 06/03/2021   GFRNONAA >60 06/03/2021   GFRAA >60 12/01/2017    Lab Results  Component Value Date   WBC 4.5 09/27/2021   NEUTROABS 1.7 06/03/2021   HGB 15.7 (H) 09/27/2021   HCT 46.7 (H) 09/27/2021   MCV 96.1 09/27/2021   PLT 223 09/27/2021     STUDIES: MM DIAG BREAST TOMO UNI RIGHT  Result Date: 01/26/2022 CLINICAL DATA:  Palpable area in the RIGHT axilla. History of LEFT mastectomy in May 2023 as well as a RIGHT lumpectomy with radiation after neoadjuvant chemotherapy with residual disease bilaterally. Two of 26 lymph nodes were positive in the LEFT axilla. RIGHT axillary lymph nodes were negative. 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 Epidermal inclusion cyst incidentally removed from the LEFT axilla. Final margin on the LEFT for positive anteriorly and negative on the RIGHT. EXAM: DIGITAL DIAGNOSTIC UNILATERAL RIGHT MAMMOGRAM WITH TOMOSYNTHESIS; US AXILLARY RIGHT TECHNIQUE: Right digital diagnostic mammography and breast tomosynthesis was performed.; Targeted ultrasound examination of the right axilla was performed. COMPARISON:  Previous exam(s). ACR Breast Density Category d: The breast tissue is extremely dense, which lowers the sensitivity of mammography. FINDINGS: There is density and architectural distortion within the RIGHT breast, consistent with postsurgical changes. These are new in comparison to prior consistent with interval lumpectomy. Spot compression tomosynthesis views were obtained of the site of palpable concern in the RIGHT axilla. There is superficial skin thickening noted subjacent to the site of palpable concern. No additional suspicious findings are noted within the RIGHT breast. Patient is status post LEFT mastectomy. On physical exam, there is a superficial dermal papule at the site of palpable concern. Patient reports this mass is decreased in size after application of hot compresses. Targeted ultrasound was performed the RIGHT axilla. There is a superficial intradermal heterogeneous fluid collection. This measures 5 x 13 x 4 mm. This likely reflects a benign intradermal mass such as an epidermal inclusion cyst. IMPRESSION: 1. There is benign intradermal mass at the site of palpable concern which likely reflects a benign epidermal inclusion cyst. Recommend conservative management of this mass. If patient is persistently symptomatic, consider evaluation by surgery or dermatology for excision. 2. New post lumpectomy changes in the RIGHT breast. No mammographic evidence of malignancy in the RIGHT breast. RECOMMENDATION: 1. Recommend annual diagnostic mammogram of the RIGHT breast or timing as  per clinician preference. 2. Given diagnosis of malignancy at a young age as well as lobular phenotype in extremely breast dense tissue, recommend supplemental screening with annual breast MRI with and without contrast. I have discussed the findings and recommendations with the patient. If applicable, a reminder letter will be sent to the patient regarding the next appointment. BI-RADS CATEGORY  2: Benign. Electronically Signed   By: Stephanie  Peacock M.D.   On: 01/26/2022 13:15  US AXILLA RIGHT  Result Date: 01/26/2022 CLINICAL DATA:  Palpable area in the RIGHT axilla. History of LEFT mastectomy in May 2023 as well as a RIGHT lumpectomy with radiation after neoadjuvant chemotherapy with residual disease bilaterally. Two of 26 lymph nodes were positive in the LEFT axilla. RIGHT axillary lymph nodes were negative. 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 Epidermal inclusion cyst incidentally removed from the LEFT axilla. Final margin on the LEFT for positive anteriorly and negative on the RIGHT. EXAM: DIGITAL DIAGNOSTIC UNILATERAL RIGHT MAMMOGRAM WITH TOMOSYNTHESIS; [place] AXILLARY RIGHT TECHNIQUE: Right digital diagnostic mammography and breast tomosynthesis was performed.; Targeted ultrasound examination of the right axilla was performed. COMPARISON:  Previous exam(s). ACR Breast Density Category d: The breast tissue is extremely dense, which lowers the sensitivity of mammography. FINDINGS: There is density and architectural distortion within the RIGHT breast, consistent with postsurgical changes. These are new in comparison to prior consistent with interval lumpectomy. Spot compression tomosynthesis views were obtained of the site of palpable concern in the RIGHT axilla. There is superficial skin thickening noted subjacent to the site of palpable concern. No  additional suspicious findings are noted within the RIGHT breast. Patient is status post LEFT mastectomy. On physical exam, there is a superficial dermal papule at the site of palpable concern. Patient reports this mass is decreased in size after application of hot compresses. Targeted ultrasound was performed the RIGHT axilla. There is a superficial intradermal heterogeneous fluid collection. This measures 5 x 13 x 4 mm. This likely reflects a benign intradermal mass such as an epidermal inclusion cyst. IMPRESSION: 1. There is benign intradermal mass at the site of palpable concern which likely reflects a benign epidermal inclusion cyst. Recommend conservative management of this mass. If patient is persistently symptomatic, consider evaluation by surgery or dermatology for excision. 2. New post lumpectomy changes in the RIGHT breast. No mammographic evidence of malignancy in the RIGHT breast. RECOMMENDATION: 1. Recommend annual diagnostic mammogram of the RIGHT breast or timing as per clinician preference. 2. Given diagnosis of malignancy at a young age as well as lobular phenotype in extremely breast dense tissue, recommend supplemental screening with annual breast MRI with and without contrast. I have discussed the findings and recommendations with the patient. If applicable, a reminder letter will be sent to the patient regarding the next appointment. BI-RADS CATEGORY  2: Benign. Electronically Signed   By: Valentino Saxon M.D.   On: 01/26/2022 13:15   ASSESSMENT: Bilateral ER/PR positive, HER2 negative breast cancer.  PLAN:    1.  Bilateral ER/PR positive, HER2 negative breast cancer: Patient completed neoadjuvant chemotherapy using Adriamycin, Cytoxan, followed by weekly Taxol on June 03, 2021.  She subsequently underwent left mastectomy, and right lumpectomy with residual malignancy in both breasts.  Right side had negative lymph nodes, left axillary dissection revealed 2 of 26 lymph nodes positive  for disease.  Patient completed her adjuvant XRT in approximately September 2023.  She initiated tamoxifen and will take a minimum of 5 years of treatment completing in September 2028, but given her high risk disease may extend treatment to 7 to 10 years.  Patient's most recent mammogram on January 26, 2022 was reported as BI-RADS 2.  Repeat in December 2024.  Return to clinic in 6 months for routine evaluation.      2.  Nausea: Resolved.  3.  Anxiety/insomnia: Patient does not complain of this today.  Continue management per primary care.   4.  Pain: Patient does not complain of this today.   5.  Hypokalemia: Resolved. 6.  Hypertension: Blood pressure moderately elevated today.  Continue evaluation and treatment per primary care.   7.  Peripheral neuropathy: Significant improved.  Patient currently on Cymbalta and gabapentin.  Continue follow-up with neuro-oncology as scheduled.   Patient expressed understanding and was in  agreement with this plan. She also understands that She can call clinic at any time with any questions, concerns, or complaints.    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HCC) Staging form: Breast, AJCC 8th Edition - Clinical stage from 01/12/2021: Stage IIB (cT2, cN1, cM0, G3, ER+, PR+, HER2-) - Signed by Finnegan, Timothy J, MD on 01/12/2021 Stage prefix: Initial diagnosis Histologic grading system: 3 grade system  Invasive ductal carcinoma of right breast (HCC) Staging form: Breast, AJCC 8th Edition - Clinical stage from 01/12/2021: Stage IB (cT2, cN0, cM0, G2, ER+, PR+, HER2-) - Signed by Finnegan, Timothy J, MD on 01/12/2021 Stage prefix: Initial diagnosis Histologic grading system: 3 grade system  Timothy J Finnegan, MD   01/28/2022 11:12 AM     

## 2022-02-01 ENCOUNTER — Inpatient Hospital Stay: Payer: Medicaid Other

## 2022-02-18 ENCOUNTER — Other Ambulatory Visit: Payer: Self-pay | Admitting: Internal Medicine

## 2022-02-18 NOTE — Telephone Encounter (Signed)
Per last OV, increase to 600 mg per day.  Refill appropriate. Gardiner Rhyme, RN

## 2022-03-18 ENCOUNTER — Telehealth: Payer: Self-pay

## 2022-03-18 ENCOUNTER — Inpatient Hospital Stay: Payer: Medicaid Other | Attending: Oncology

## 2022-03-18 DIAGNOSIS — F17210 Nicotine dependence, cigarettes, uncomplicated: Secondary | ICD-10-CM | POA: Diagnosis not present

## 2022-03-18 DIAGNOSIS — C50812 Malignant neoplasm of overlapping sites of left female breast: Secondary | ICD-10-CM | POA: Insufficient documentation

## 2022-03-18 DIAGNOSIS — Z79810 Long term (current) use of selective estrogen receptor modulators (SERMs): Secondary | ICD-10-CM | POA: Insufficient documentation

## 2022-03-18 DIAGNOSIS — C50811 Malignant neoplasm of overlapping sites of right female breast: Secondary | ICD-10-CM | POA: Insufficient documentation

## 2022-03-18 DIAGNOSIS — Z95828 Presence of other vascular implants and grafts: Secondary | ICD-10-CM

## 2022-03-18 DIAGNOSIS — Z452 Encounter for adjustment and management of vascular access device: Secondary | ICD-10-CM | POA: Diagnosis present

## 2022-03-18 DIAGNOSIS — Z170 Estrogen receptor positive status [ER+]: Secondary | ICD-10-CM | POA: Diagnosis not present

## 2022-03-18 MED ORDER — HEPARIN SOD (PORK) LOCK FLUSH 100 UNIT/ML IV SOLN
500.0000 [IU] | Freq: Once | INTRAVENOUS | Status: AC
  Administered 2022-03-18: 500 [IU] via INTRAVENOUS
  Filled 2022-03-18: qty 5

## 2022-03-18 MED ORDER — SODIUM CHLORIDE 0.9% FLUSH
10.0000 mL | Freq: Once | INTRAVENOUS | Status: AC
  Administered 2022-03-18: 10 mL via INTRAVENOUS
  Filled 2022-03-18: qty 10

## 2022-03-18 NOTE — Telephone Encounter (Signed)
Faxed medical records to [place] at [facility] at [phone].

## 2022-04-21 ENCOUNTER — Encounter: Payer: Self-pay | Admitting: Radiation Oncology

## 2022-04-21 ENCOUNTER — Ambulatory Visit
Admission: RE | Admit: 2022-04-21 | Discharge: 2022-04-21 | Disposition: A | Discharge status: Home / Self Care | Payer: Medicaid Other | Source: Ambulatory Visit | Attending: Radiation Oncology | Admitting: Radiation Oncology

## 2022-04-21 VITALS — BP 189/132 | HR 112 | Temp 98.6°F | Resp 12 | Wt 136.0 lb

## 2022-04-21 DIAGNOSIS — Z79811 Long term (current) use of aromatase inhibitors: Secondary | ICD-10-CM | POA: Insufficient documentation

## 2022-04-21 DIAGNOSIS — Z923 Personal history of irradiation: Secondary | ICD-10-CM | POA: Insufficient documentation

## 2022-04-21 DIAGNOSIS — Z170 Estrogen receptor positive status [ER+]: Secondary | ICD-10-CM | POA: Insufficient documentation

## 2022-04-21 DIAGNOSIS — C50811 Malignant neoplasm of overlapping sites of right female breast: Secondary | ICD-10-CM

## 2022-04-21 DIAGNOSIS — I10 Essential (primary) hypertension: Secondary | ICD-10-CM | POA: Insufficient documentation

## 2022-04-21 DIAGNOSIS — C50911 Malignant neoplasm of unspecified site of right female breast: Secondary | ICD-10-CM | POA: Diagnosis not present

## 2022-04-21 DIAGNOSIS — C50912 Malignant neoplasm of unspecified site of left female breast: Secondary | ICD-10-CM | POA: Diagnosis not present

## 2022-04-21 NOTE — Progress Notes (Signed)
Radiation Oncology Follow up Note  Name: Briana Soto   Date:   04/21/2022 MRN:  [id number] DOB: Jul 17, 1975    This 47 y.o. female presents to the clinic today for 35-monthfollow-up status post adjuvant radiation therapy to her right breast as well as her left chest wall for stage Ia invasive mammary carcinoma left breast and stage T2 N1 invasive mammary carcinoma status post left modified radical mastectomy..Marland Kitchen REFERRING PROVIDER: FCarrolyn Meiers  HPI: Patient is a 47year old female who received right whole breast radiation as well as left chest wall radiation for above stated malignancies.  She is seen today 652-monthut she is doing well.  She is quite hypertensive today although recently refilled her.  Blood pressure medicine.  She is also currently on tamoxifen tolerating that well.  She specifically denies breast tenderness cough or bone pain.  COMPLICATIONS OF TREATMENT: none  FOLLOW UP COMPLIANCE: keeps appointments   PHYSICAL EXAM:  BP (!) 189/132 Comment: Recheck patient advised to go to Emergency Room to address BP  Pulse (!) 112   Temp 98.6 F (37 C) (Tympanic)   Resp 12   Wt 136 lb (61.7 kg)   BMI 22.63 kg/m  Patient status post left modified radical mastectomy chest wall is clear.  Right breast is free of dominant mass.  No axillary or supraclavicular adenopathy is appreciated.  Well-developed well-nourished patient in NAD. HEENT reveals PERLA, EOMI, discs not visualized.  Oral cavity is clear. No oral mucosal lesions are identified. Neck is clear without evidence of cervical or supraclavicular adenopathy. Lungs are clear to A&P. Cardiac examination is essentially unremarkable with regular rate and rhythm without murmur rub or thrill. Abdomen is benign with no organomegaly or masses noted. Motor sensory and DTR levels are equal and symmetric in the upper and lower extremities. Cranial nerves II through XII are grossly intact. Proprioception is intact. No  peripheral adenopathy or edema is identified. No motor or sensory levels are noted. Crude visual fields are within normal range.  RADIOLOGY RESULTS: Mammograms from December reviewed showing BI-RADS 2 benign.  PLAN: Present patient is now out 6 months with no evidence of disease she is doing well very low side effect profile.  She will restart her blood pressure medicine today.  She will see her PMD should her blood pressure remain elevated.  I have asked to see her back in 6 months for follow-up.  She continues on tamoxifen without side effect.  Patient is to call with any concerns.  I would like to take this opportunity to thank you for allowing me to participate in the care of your patient.. Noreene FilbertMD

## 2022-04-29 ENCOUNTER — Inpatient Hospital Stay: Payer: Medicaid Other

## 2022-05-05 ENCOUNTER — Inpatient Hospital Stay: Payer: Medicaid Other | Attending: Oncology

## 2022-05-05 DIAGNOSIS — C50811 Malignant neoplasm of overlapping sites of right female breast: Secondary | ICD-10-CM | POA: Diagnosis present

## 2022-05-05 DIAGNOSIS — C50911 Malignant neoplasm of unspecified site of right female breast: Secondary | ICD-10-CM

## 2022-05-05 DIAGNOSIS — C50812 Malignant neoplasm of overlapping sites of left female breast: Secondary | ICD-10-CM | POA: Insufficient documentation

## 2022-05-05 DIAGNOSIS — Z95828 Presence of other vascular implants and grafts: Secondary | ICD-10-CM

## 2022-05-05 DIAGNOSIS — Z452 Encounter for adjustment and management of vascular access device: Secondary | ICD-10-CM | POA: Diagnosis present

## 2022-05-05 DIAGNOSIS — Z170 Estrogen receptor positive status [ER+]: Secondary | ICD-10-CM | POA: Diagnosis not present

## 2022-05-05 MED ORDER — SODIUM CHLORIDE 0.9% FLUSH
10.0000 mL | Freq: Once | INTRAVENOUS | Status: AC
  Administered 2022-05-05: 10 mL via INTRAVENOUS
  Filled 2022-05-05: qty 10

## 2022-05-05 MED ORDER — HEPARIN SOD (PORK) LOCK FLUSH 100 UNIT/ML IV SOLN
500.0000 [IU] | Freq: Once | INTRAVENOUS | Status: AC
  Administered 2022-05-05: 500 [IU] via INTRAVENOUS
  Filled 2022-05-05: qty 5

## 2022-05-05 NOTE — Progress Notes (Signed)
Survivorship Care Plan visit completed.  Treatment summary reviewed and given to patient.  ASCO answers booklet reviewed and given to patient.  CARE program and Cancer Transitions discussed with patient along with other resources cancer center offers to patients and caregivers.  Patient verbalized understanding.    

## 2022-05-25 ENCOUNTER — Inpatient Hospital Stay: Payer: Medicaid Other | Attending: Oncology | Admitting: Oncology

## 2022-05-25 ENCOUNTER — Encounter: Payer: Self-pay | Admitting: Oncology

## 2022-05-25 VITALS — BP 155/117 | HR 79 | Temp 96.0°F | Resp 16 | Ht 65.0 in | Wt 138.0 lb

## 2022-05-25 DIAGNOSIS — C50811 Malignant neoplasm of overlapping sites of right female breast: Secondary | ICD-10-CM | POA: Diagnosis present

## 2022-05-25 DIAGNOSIS — Z79810 Long term (current) use of selective estrogen receptor modulators (SERMs): Secondary | ICD-10-CM | POA: Insufficient documentation

## 2022-05-25 DIAGNOSIS — C50912 Malignant neoplasm of unspecified site of left female breast: Secondary | ICD-10-CM | POA: Diagnosis not present

## 2022-05-25 DIAGNOSIS — F17210 Nicotine dependence, cigarettes, uncomplicated: Secondary | ICD-10-CM | POA: Diagnosis not present

## 2022-05-25 DIAGNOSIS — Z79899 Other long term (current) drug therapy: Secondary | ICD-10-CM | POA: Insufficient documentation

## 2022-05-25 DIAGNOSIS — C50911 Malignant neoplasm of unspecified site of right female breast: Secondary | ICD-10-CM

## 2022-05-25 DIAGNOSIS — I10 Essential (primary) hypertension: Secondary | ICD-10-CM | POA: Insufficient documentation

## 2022-05-25 DIAGNOSIS — Z170 Estrogen receptor positive status [ER+]: Secondary | ICD-10-CM

## 2022-05-25 DIAGNOSIS — G629 Polyneuropathy, unspecified: Secondary | ICD-10-CM | POA: Insufficient documentation

## 2022-05-25 DIAGNOSIS — Z8249 Family history of ischemic heart disease and other diseases of the circulatory system: Secondary | ICD-10-CM | POA: Insufficient documentation

## 2022-05-25 DIAGNOSIS — C50812 Malignant neoplasm of overlapping sites of left female breast: Secondary | ICD-10-CM | POA: Diagnosis present

## 2022-05-25 NOTE — Progress Notes (Signed)
[facility]  Telephone:(336) [phone] Fax:(336) [phone]  ID: Venetia Night OB: 1975-10-31  MR#: [id number]  KGM#:[id number]  Patient Care Team: Benetta Spar, MD as PCP - General (Internal Medicine) Jim Like, RN as Registered Nurse Scarlett Presto, RN (Inactive) as Registered Nurse Carmina Miller, MD as Consulting Physician (Radiation Oncology) Jeralyn Ruths, MD as Consulting Physician (Oncology) Campbell Lerner, MD as Consulting Physician (General Surgery)  CHIEF COMPLAINT: Bilateral ER/PR positive, HER2 negative breast cancer.  INTERVAL HISTORY: Patient returns to clinic today for routine 43-month evaluation.  She currently feels well and is asymptomatic.  She continues to tolerate tamoxifen without significant side effects.  She does not complain of peripheral neuropathy today.  She has no other neurologic complaints.  She denies any recent fevers or illnesses.  She has a good appetite and denies weight loss.  She has no chest pain, shortness of breath, cough, or hemoptysis.  She denies any nausea, vomiting, constipation, or diarrhea.  She has no urinary complaints.  Patient offers no further specific complaints today.    REVIEW OF SYSTEMS:   Review of Systems  Constitutional: Negative.  Negative for chills, fever and malaise/fatigue.  Respiratory: Negative.  Negative for cough, hemoptysis and shortness of breath.   Cardiovascular: Negative.  Negative for chest pain and leg swelling.  Gastrointestinal: Negative.  Negative for abdominal pain and nausea.  Genitourinary: Negative.  Negative for dysuria.  Musculoskeletal: Negative.  Negative for back pain.  Skin: Negative.  Negative for rash.  Neurological: Negative.  Negative for dizziness, tingling, sensory change, focal weakness, weakness and headaches.  Psychiatric/Behavioral: Negative.  The patient is not nervous/anxious and does not have insomnia.     As per HPI. Otherwise, a complete  review of systems is negative.  PAST MEDICAL HISTORY: Past Medical History:  Diagnosis Date   Anxiety    Headache    Hypertension    Noncompliance     PAST SURGICAL HISTORY: Past Surgical History:  Procedure Laterality Date   BREAST BIOPSY Right 12/24/2020   mass, 9:00 8 cmfn venus marker, [facility]   BREAST BIOPSY Left 12/24/2020   mass 3:00 retroareolar, ribbon, PREDOMINANTLY KERATINACEOUS DEBRIS.   BREAST BIOPSY Left 12/24/2020   mass 3:00 5cmfn, heart marker, INVASIVE MAMMARY CARCINOMA   BREAST BIOPSY Left 12/24/2020   axilla LN, hyrdomarker shape 3 coil, METASTATIC CARCINOMA   BREAST LUMPECTOMY WITH RADIOFREQUENCY TAG IDENTIFICATION Right 06/24/2021   BREAST LUMPECTOMY,RADIO FREQ LOCALIZER,AXILLARY SENTINEL LYMPH NODE BIOPSY Right 07/05/2021   Procedure: BREAST LUMPECTOMY,RADIO FREQ LOCALIZER,AXILLARY SENTINEL LYMPH NODE BIOPSY;  Surgeon: Campbell Lerner, MD;  Location: [facility] ORS;  Service: General;  Laterality: Right;   MASTECTOMY MODIFIED RADICAL Left 07/05/2021   Procedure: MASTECTOMY MODIFIED RADICAL;  Surgeon: Campbell Lerner, MD;  Location: [facility] ORS;  Service: General;  Laterality: Left;   PORTACATH PLACEMENT Right 01/01/2021   Procedure: INSERTION PORT-A-CATH;  Surgeon: Campbell Lerner, MD;  Location: [facility] ORS;  Service: General;  Laterality: Right;   TUBAL LIGATION      FAMILY HISTORY: Family History  Problem Relation Age of Onset   COPD Mother    Hypertension Mother    Heart disease Father     ADVANCED DIRECTIVES (Y/N):  N  HEALTH MAINTENANCE: Social History   Tobacco Use   Smoking status: Every Day    Packs/day: 1.5    Types: Cigarettes   Smokeless tobacco: Never  Vaping Use   Vaping Use: Never used  Substance Use Topics   Alcohol use: Yes  Alcohol/week: 14.0 standard drinks of alcohol    Types: 14 Cans of beer per week    Comment: at least 2 beers daily   Drug use: No     Colonoscopy:  PAP:  Bone density:  Lipid panel:  Allergies   Allergen Reactions   Codeine Itching and Nausea And Vomiting    Current Outpatient Medications  Medication Sig Dispense Refill   acetaminophen (TYLENOL) 325 MG tablet Take 650 mg by mouth every 6 (six) hours as needed.     amLODipine (NORVASC) 10 MG tablet Take 10 mg by mouth daily.     bacitracin ointment Apply 1 application topically 2 (two) times daily. 120 g 0   diphenhydrAMINE HCl (BENADRYL ALLERGY PO) Take by mouth.     diphenhydrAMINE-zinc acetate (BENADRYL EXTRA STRENGTH) cream Apply 1 application topically 3 (three) times daily as needed for itching. 28.4 g 0   DULoxetine HCl 40 MG CPEP Take 40 mg by mouth daily. 60 capsule 5   gabapentin (NEURONTIN) 300 MG capsule TAKE 2 CAPSULES BY MOUTH TWICE DAILY 120 capsule 1   hydrochlorothiazide (HYDRODIURIL) 25 MG tablet Take 25 mg by mouth daily.     labetalol (NORMODYNE) 100 MG tablet TAKE ONE TABLET BY MOUTH TWICE DAILY 60 tablet 0   lidocaine-prilocaine (EMLA) cream Apply to affected area once 30 g 3   tamoxifen (NOLVADEX) 20 MG tablet Take 1 tablet (20 mg total) by mouth daily. 90 tablet 3   ALPRAZolam (XANAX) 0.25 MG tablet Take 1 tablet (0.25 mg total) by mouth at bedtime. (Patient not taking: Reported on 05/25/2022) 30 tablet 0   loratadine (CLARITIN) 10 MG tablet Take 10 mg by mouth daily. (Patient not taking: Reported on 05/25/2022)     ondansetron (ZOFRAN) 8 MG tablet Take 1 tablet (8 mg total) by mouth 2 (two) times daily. (Patient not taking: Reported on 04/21/2022) 60 tablet 2   Potassium Chloride ER 20 MEQ TBCR Take 1 tablet by mouth daily. (Patient not taking: Reported on 01/28/2022)     prednisoLONE (ORAPRED) 15 MG/5ML solution Take by mouth. (Patient not taking: Reported on 01/28/2022)     prochlorperazine (COMPAZINE) 10 MG tablet Take 1 tablet (10 mg total) by mouth every 6 (six) hours as needed for nausea or vomiting. (Patient not taking: Reported on 11/11/2021) 60 tablet 2   silver sulfADIAZINE (SILVADENE) 1 % cream Apply 1  Application topically 2 (two) times daily. (Patient not taking: Reported on 11/11/2021) 50 g 2   Vitamin D, Ergocalciferol, 50000 units CAPS Take 1 capsule by mouth once a week. (Patient not taking: Reported on 05/25/2022)     No current facility-administered medications for this visit.    OBJECTIVE: Vitals:   05/25/22 1013  BP: (!) 155/117  Pulse: 79  Resp: 16  Temp: (!) 96 F (35.6 C)  SpO2: 100%      Body mass index is 22.96 kg/m.    ECOG FS:0 - Asymptomatic  General: Well-developed, well-nourished, no acute distress. Eyes: Pink conjunctiva, anicteric sclera. HEENT: Normocephalic, moist mucous membranes. Breasts: Exam deferred today. Lungs: No audible wheezing or coughing. Heart: Regular rate and rhythm. Abdomen: Soft, nontender, no obvious distention. Musculoskeletal: No edema, cyanosis, or clubbing. Neuro: Alert, answering all questions appropriately. Cranial nerves grossly intact. Skin: No rashes or petechiae noted. Psych: Normal affect.  LAB RESULTS:  Lab Results  Component Value Date   NA 134 (L) 06/03/2021   K 3.5 06/03/2021   CL 103 06/03/2021   CO2 23 06/03/2021  GLUCOSE 125 (H) 06/03/2021   BUN 7 06/03/2021   CREATININE 0.73 06/03/2021   CALCIUM 8.8 (L) 06/03/2021   PROT 6.8 06/03/2021   ALBUMIN 3.5 06/03/2021   AST 21 06/03/2021   ALT 12 06/03/2021   ALKPHOS 68 06/03/2021   BILITOT 0.1 (L) 06/03/2021   GFRNONAA >60 06/03/2021   GFRAA >60 12/01/2017    Lab Results  Component Value Date   WBC 4.5 09/27/2021   NEUTROABS 1.7 06/03/2021   HGB 15.7 (H) 09/27/2021   HCT 46.7 (H) 09/27/2021   MCV 96.1 09/27/2021   PLT 223 09/27/2021     STUDIES: No results found.  ASSESSMENT: Bilateral ER/PR positive, HER2 negative breast cancer.  PLAN:    Bilateral ER/PR positive, HER2 negative breast cancer: Patient completed neoadjuvant chemotherapy using Adriamycin, Cytoxan, followed by weekly Taxol on June 03, 2021.  She subsequently underwent left  mastectomy, and right lumpectomy with residual malignancy in both breasts.  Right side had negative lymph nodes, left axillary dissection revealed 2 of 26 lymph nodes positive for disease.  Patient completed her adjuvant XRT in approximately September 2023.  She initiated tamoxifen and will take a minimum of 5 years of treatment completing in September 2028, but given her high risk disease may extend treatment to 7 to 10 years.  Patient's most recent mammogram on January 26, 2022 was reported as BI-RADS 2.  Repeat in December 2024.  Return to clinic in 6 months for routine evaluation.  Anxiety/insomnia: Patient does not complain of this today.  Continue management per primary care.  Hypertension: Blood pressure remains persistently elevated.  Continue evaluation and treatment per primary care. Peripheral neuropathy: Significant improved.  Patient currently on Cymbalta and gabapentin.  Continue follow-up with primary care.   Patient expressed understanding and was in agreement with this plan. She also understands that She can call clinic at any time with any questions, concerns, or complaints.    Cancer Staging  Invasive ductal carcinoma of left breast Staging form: Breast, AJCC 8th Edition - Clinical stage from 01/12/2021: Stage IIB (cT2, cN1, cM0, G3, ER+, PR+, HER2-) - Signed by Jeralyn Ruths, MD on 01/12/2021 Stage prefix: Initial diagnosis Histologic grading system: 3 grade system  Invasive ductal carcinoma of right breast Staging form: Breast, AJCC 8th Edition - Clinical stage from 01/12/2021: Stage IB (cT2, cN0, cM0, G2, ER+, PR+, HER2-) - Signed by Jeralyn Ruths, MD on 01/12/2021 Stage prefix: Initial diagnosis Histologic grading system: 3 grade system  Jeralyn Ruths, MD   05/26/2022 7:08 AM

## 2022-06-10 ENCOUNTER — Inpatient Hospital Stay: Payer: Medicaid Other

## 2022-06-27 ENCOUNTER — Other Ambulatory Visit: Payer: Self-pay | Admitting: Internal Medicine

## 2022-07-06 ENCOUNTER — Inpatient Hospital Stay: Payer: Medicaid Other | Attending: Oncology

## 2022-07-06 DIAGNOSIS — C50811 Malignant neoplasm of overlapping sites of right female breast: Secondary | ICD-10-CM | POA: Diagnosis present

## 2022-07-06 DIAGNOSIS — Z95828 Presence of other vascular implants and grafts: Secondary | ICD-10-CM

## 2022-07-06 DIAGNOSIS — C50812 Malignant neoplasm of overlapping sites of left female breast: Secondary | ICD-10-CM | POA: Diagnosis present

## 2022-07-06 DIAGNOSIS — Z452 Encounter for adjustment and management of vascular access device: Secondary | ICD-10-CM | POA: Insufficient documentation

## 2022-07-06 MED ORDER — HEPARIN SOD (PORK) LOCK FLUSH 100 UNIT/ML IV SOLN
500.0000 [IU] | Freq: Once | INTRAVENOUS | Status: AC
  Administered 2022-07-06: 500 [IU] via INTRAVENOUS
  Filled 2022-07-06: qty 5

## 2022-07-06 MED ORDER — SODIUM CHLORIDE 0.9% FLUSH
10.0000 mL | Freq: Once | INTRAVENOUS | Status: AC
  Administered 2022-07-06: 10 mL via INTRAVENOUS
  Filled 2022-07-06: qty 10

## 2022-07-22 ENCOUNTER — Inpatient Hospital Stay: Payer: Medicaid Other

## 2022-07-27 ENCOUNTER — Other Ambulatory Visit: Payer: Self-pay | Admitting: Oncology

## 2022-07-29 ENCOUNTER — Ambulatory Visit: Payer: Medicaid Other | Admitting: Oncology

## 2022-08-17 ENCOUNTER — Inpatient Hospital Stay: Payer: Medicaid Other | Attending: Oncology

## 2022-08-23 ENCOUNTER — Telehealth: Payer: Self-pay | Admitting: *Deleted

## 2022-08-23 ENCOUNTER — Other Ambulatory Visit: Payer: Self-pay | Admitting: *Deleted

## 2022-08-23 DIAGNOSIS — C50911 Malignant neoplasm of unspecified site of right female breast: Secondary | ICD-10-CM

## 2022-08-23 NOTE — Telephone Encounter (Signed)
Patient called reporting that she is having problem with her port. When questioned further, she states she noted yesterday that she has something sticking out of her underarm down to her elbow, I informed her that the port does not go under the arm.  She also has a "bump" under her right arm since yesterday the size of a pea. She reports pain in her elbow and it feels tight it is not red or warm to touch.Her s/o stated that it looks like a vessel that is poked out.  She states she did have a lumpectomy and lymph node dissection last year on this same arm. Please advise.

## 2022-08-23 NOTE — Telephone Encounter (Signed)
Pt contacted. Smc apt arranged tomorrow lab/ smc- labs at 830- apt w/Josh, NP following.

## 2022-08-24 ENCOUNTER — Encounter: Payer: Self-pay | Admitting: Hospice and Palliative Medicine

## 2022-08-24 ENCOUNTER — Inpatient Hospital Stay (HOSPITAL_BASED_OUTPATIENT_CLINIC_OR_DEPARTMENT_OTHER): Payer: Medicaid Other | Admitting: Hospice and Palliative Medicine

## 2022-08-24 ENCOUNTER — Inpatient Hospital Stay: Payer: Medicaid Other | Attending: Oncology

## 2022-08-24 ENCOUNTER — Telehealth: Payer: Self-pay | Admitting: *Deleted

## 2022-08-24 ENCOUNTER — Other Ambulatory Visit: Payer: Self-pay

## 2022-08-24 ENCOUNTER — Ambulatory Visit
Admission: RE | Admit: 2022-08-24 | Discharge: 2022-08-24 | Disposition: A | Discharge status: Home / Self Care | Payer: Medicaid Other | Source: Ambulatory Visit | Attending: Hospice and Palliative Medicine | Admitting: Hospice and Palliative Medicine

## 2022-08-24 ENCOUNTER — Inpatient Hospital Stay: Payer: Medicaid Other | Admitting: Occupational Therapy

## 2022-08-24 VITALS — BP 133/114 | HR 72 | Temp 79.0°F | Resp 20 | Ht 65.0 in | Wt 138.0 lb

## 2022-08-24 DIAGNOSIS — Z79810 Long term (current) use of selective estrogen receptor modulators (SERMs): Secondary | ICD-10-CM | POA: Insufficient documentation

## 2022-08-24 DIAGNOSIS — C50912 Malignant neoplasm of unspecified site of left female breast: Secondary | ICD-10-CM | POA: Insufficient documentation

## 2022-08-24 DIAGNOSIS — C50911 Malignant neoplasm of unspecified site of right female breast: Secondary | ICD-10-CM

## 2022-08-24 DIAGNOSIS — Z9221 Personal history of antineoplastic chemotherapy: Secondary | ICD-10-CM | POA: Diagnosis not present

## 2022-08-24 DIAGNOSIS — I10 Essential (primary) hypertension: Secondary | ICD-10-CM | POA: Diagnosis not present

## 2022-08-24 DIAGNOSIS — M79621 Pain in right upper arm: Secondary | ICD-10-CM

## 2022-08-24 DIAGNOSIS — C50812 Malignant neoplasm of overlapping sites of left female breast: Secondary | ICD-10-CM | POA: Insufficient documentation

## 2022-08-24 DIAGNOSIS — I82621 Acute embolism and thrombosis of deep veins of right upper extremity: Secondary | ICD-10-CM | POA: Diagnosis not present

## 2022-08-24 DIAGNOSIS — Z923 Personal history of irradiation: Secondary | ICD-10-CM | POA: Diagnosis not present

## 2022-08-24 DIAGNOSIS — G629 Polyneuropathy, unspecified: Secondary | ICD-10-CM | POA: Insufficient documentation

## 2022-08-24 DIAGNOSIS — Z9012 Acquired absence of left breast and nipple: Secondary | ICD-10-CM | POA: Diagnosis not present

## 2022-08-24 DIAGNOSIS — Z170 Estrogen receptor positive status [ER+]: Secondary | ICD-10-CM | POA: Diagnosis present

## 2022-08-24 DIAGNOSIS — C50811 Malignant neoplasm of overlapping sites of right female breast: Secondary | ICD-10-CM | POA: Insufficient documentation

## 2022-08-24 DIAGNOSIS — Z7901 Long term (current) use of anticoagulants: Secondary | ICD-10-CM | POA: Diagnosis not present

## 2022-08-24 DIAGNOSIS — Z79899 Other long term (current) drug therapy: Secondary | ICD-10-CM | POA: Insufficient documentation

## 2022-08-24 LAB — CBC WITH DIFFERENTIAL (CANCER CENTER ONLY)
Abs Immature Granulocytes: 0.02 10*3/uL (ref 0.00–0.07)
Basophils Absolute: 0 10*3/uL (ref 0.0–0.1)
Basophils Relative: 1 %
Eosinophils Absolute: 0 10*3/uL (ref 0.0–0.5)
Eosinophils Relative: 1 %
HCT: 42.1 % (ref 36.0–46.0)
Hemoglobin: 14.6 g/dL (ref 12.0–15.0)
Immature Granulocytes: 1 %
Lymphocytes Relative: 31 %
Lymphs Abs: 1.2 10*3/uL (ref 0.7–4.0)
MCH: 35.6 pg — ABNORMAL HIGH (ref 26.0–34.0)
MCHC: 34.7 g/dL (ref 30.0–36.0)
MCV: 102.7 fL — ABNORMAL HIGH (ref 80.0–100.0)
Monocytes Absolute: 0.3 10*3/uL (ref 0.1–1.0)
Monocytes Relative: 8 %
Neutro Abs: 2.4 10*3/uL (ref 1.7–7.7)
Neutrophils Relative %: 58 %
Platelet Count: 234 10*3/uL (ref 150–400)
RBC: 4.1 MIL/uL (ref 3.87–5.11)
RDW: 11.9 % (ref 11.5–15.5)
WBC Count: 4 10*3/uL (ref 4.0–10.5)
nRBC: 0 % (ref 0.0–0.2)

## 2022-08-24 LAB — CMP (CANCER CENTER ONLY)
ALT: 25 U/L (ref 0–44)
AST: 22 U/L (ref 15–41)
Albumin: 4.1 g/dL (ref 3.5–5.0)
Alkaline Phosphatase: 67 U/L (ref 38–126)
Anion gap: 8 (ref 5–15)
BUN: 8 mg/dL (ref 6–20)
CO2: 27 mmol/L (ref 22–32)
Calcium: 9.3 mg/dL (ref 8.9–10.3)
Chloride: 101 mmol/L (ref 98–111)
Creatinine: 0.67 mg/dL (ref 0.44–1.00)
GFR, Estimated: >60 mL/min (ref >60)
Glucose, Bld: 130 mg/dL — ABNORMAL HIGH (ref 70–99)
Potassium: 3.4 mmol/L — ABNORMAL LOW (ref 3.5–5.1)
Sodium: 136 mmol/L (ref 135–145)
Total Bilirubin: 0.5 mg/dL (ref 0.3–1.2)
Total Protein: 7.7 g/dL (ref 6.5–8.1)

## 2022-08-24 MED ORDER — DOXYCYCLINE HYCLATE 100 MG PO TABS: 100.0000 mg | ORAL_TABLET | Freq: Two times a day (BID) | ORAL | 0 refills | Status: DC

## 2022-08-24 MED ORDER — APIXABAN (ELIQUIS) VTE STARTER PACK (10MG AND 5MG): ORAL_TABLET | ORAL | 0 refills | Status: DC

## 2022-08-24 NOTE — Telephone Encounter (Signed)
Called report  IMPRESSION: 1. There is occlusive thrombus within 1 of the right brachial veins. No additional thrombus is visualized in the right upper extremity. 2. There is a 9 mm intradermal hypoechoic mass corresponding to the area of palpable concern. A similar intradermal mass was imaged on January 26, 2022, consistent with an epidermal inclusion cyst. Recommend correlation with physical exam and consider further evaluation with dermatology consultation.   These results will be called to the ordering clinician or representative by the Radiologist Assistant, and communication documented in the PACS or [employer].     Electronically Signed   By: Jacob Moores M.D.   On: 08/24/2022 14:55

## 2022-08-24 NOTE — Telephone Encounter (Signed)
Discussed with Dr. Orlie Dakin. Will send Rx for Eliquis starter pack - 10mg  BID x 7 days, then 5mg  BID, likely for at least 3 months. Reviewed instructions with patient including monitoring for signs of bleeding, avoiding trauma, ED triggers, etc. Ok to hold doxy. Patient to f/u with Dr. Orlie Dakin next week.

## 2022-08-24 NOTE — Progress Notes (Addendum)
Symptom Management Clinic [facility] Telephone:(336) [phone] Fax:(336) [phone]  Patient Care Team: Benetta Spar, MD as PCP - General (Internal Medicine) Jim Like, RN as Registered Nurse Scarlett Presto, RN (Inactive) as Registered Nurse Carmina Miller, MD as Consulting Physician (Radiation Oncology) Jeralyn Ruths, MD as Consulting Physician (Oncology) Campbell Lerner, MD as Consulting Physician (General Surgery)   NAME OF PATIENT: Briana Soto  [id number]  07-18-1975   DATE OF VISIT: 08/24/22  REASON FOR CONSULT: KALY FICHTNER is a 47 y.o. female with multiple medical problems including bilateral ER/PR positive, HER2 negative breast cancer.  Patient completed neoadjuvant chemotherapy with Adriamycin, Cytoxan, followed by weekly Taxol on June 03, 2021.  This was followed by left mastectomy and right lumpectomy.  Patient completed adjuvant XRT in September 2023.  She is now on tamoxifen with plan of at least 5 years of treatment.  INTERVAL HISTORY: Patient requested [facility] visit today for evaluation of tenderness to the right arm.  Patient says that she has noted a hardness to the right bicep area over the last several days.  This has been associated with some tenderness, particularly as she works in [employer] and is right-handed.  She denies swelling to the arm or redness.  She has not noted any breast masses or swelling.  She has not noticed any redness, pain, or discharge from her port.  Patient says that she did cut her right hand 1 to 2 weeks ago.  Denies any neurologic complaints. Denies recent fevers or illnesses. Denies any easy bleeding or bruising. Reports good appetite and denies weight loss. Denies chest pain. Denies any nausea, vomiting, constipation, or diarrhea. Denies urinary complaints. Patient offers no further specific complaints today.   PAST MEDICAL HISTORY: Past Medical History:  Diagnosis  Date   Anxiety    Headache    Hypertension    Noncompliance     PAST SURGICAL HISTORY:  Past Surgical History:  Procedure Laterality Date   BREAST BIOPSY Right 12/24/2020   mass, 9:00 8 cmfn venus marker, [facility]   BREAST BIOPSY Left 12/24/2020   mass 3:00 retroareolar, ribbon, PREDOMINANTLY KERATINACEOUS DEBRIS.   BREAST BIOPSY Left 12/24/2020   mass 3:00 5cmfn, heart marker, INVASIVE MAMMARY CARCINOMA   BREAST BIOPSY Left 12/24/2020   axilla LN, hyrdomarker shape 3 coil, METASTATIC CARCINOMA   BREAST LUMPECTOMY WITH RADIOFREQUENCY TAG IDENTIFICATION Right 06/24/2021   BREAST LUMPECTOMY,RADIO FREQ LOCALIZER,AXILLARY SENTINEL LYMPH NODE BIOPSY Right 07/05/2021   Procedure: BREAST LUMPECTOMY,RADIO FREQ LOCALIZER,AXILLARY SENTINEL LYMPH NODE BIOPSY;  Surgeon: Campbell Lerner, MD;  Location: [facility] ORS;  Service: General;  Laterality: Right;   MASTECTOMY MODIFIED RADICAL Left 07/05/2021   Procedure: MASTECTOMY MODIFIED RADICAL;  Surgeon: Campbell Lerner, MD;  Location: [facility] ORS;  Service: General;  Laterality: Left;   PORTACATH PLACEMENT Right 01/01/2021   Procedure: INSERTION PORT-A-CATH;  Surgeon: Campbell Lerner, MD;  Location: [facility] ORS;  Service: General;  Laterality: Right;   TUBAL LIGATION      HEMATOLOGY/ONCOLOGY HISTORY:  Oncology History  Invasive ductal carcinoma of left breast (HCC)  01/12/2021 Initial Diagnosis   Invasive ductal carcinoma of left breast (HCC)   01/12/2021 Cancer Staging   Staging form: Breast, AJCC 8th Edition - Clinical stage from 01/12/2021: Stage IIB (cT2, cN1, cM0, G3, ER+, PR+, HER2-) - Signed by Jeralyn Ruths, MD on 01/12/2021 Stage prefix: Initial diagnosis Histologic grading system: 3 grade system   01/21/2021 - 06/03/2021 Chemotherapy   Patient is on Treatment  Plan : BREAST ADJUVANT DOSE DENSE AC q14d / PACLitaxel q7d      Genetic Testing   Negative genetic testing. No pathogenic variants identified on the [facility] Multi-Cancer+RNA panel.  VUS in ATM called c.1838T>G, VUS in EGFR called c.2884C>G, VUS in POLD1 called c.1072C>T, and VUS in RUNX1 called c.872T>C identified. The report date is 02/13/2021.  The Multi-Cancer Panel + RNA offered by [facility] includes sequencing and/or deletion duplication testing of the following 84 genes: AIP, ALK, APC, ATM, AXIN2,BAP1,  BARD1, BLM, BMPR1A, BRCA1, BRCA2, BRIP1, CASR, CDC73, CDH1, CDK4, CDKN1B, CDKN1C, CDKN2A (p14ARF), CDKN2A (p16INK4a), CEBPA, CHEK2, CTNNA1, DICER1, DIS3L2, EGFR (c.2369C>T, p.Thr790Met variant only), EPCAM (Deletion/duplication testing only), FH, FLCN, GATA2, GPC3, GREM1 (Promoter region deletion/duplication testing only), HOXB13 (c.251G>A, p.Gly84Glu), HRAS, KIT, MAX, MEN1, MET, MITF (c.952G>A, p.Glu318Lys variant only), MLH1, MSH2, MSH3, MSH6, MUTYH, NBN, NF1, NF2, NTHL1, PALB2, PDGFRA, PHOX2B, PMS2, POLD1, POLE, POT1, PRKAR1A, PTCH1, PTEN, RAD50, RAD51C, RAD51D, RB1, RECQL4, RET, RUNX1, SDHAF2, SDHA (sequence changes only), SDHB, SDHC, SDHD, SMAD4, SMARCA4, SMARCB1, SMARCE1, STK11, SUFU, TERC, TERT, TMEM127, TP53, TSC1, TSC2, VHL, WRN and WT1.   Invasive ductal carcinoma of right breast (HCC)  01/12/2021 Initial Diagnosis   Invasive ductal carcinoma of right breast (HCC)   01/12/2021 Cancer Staging   Staging form: Breast, AJCC 8th Edition - Clinical stage from 01/12/2021: Stage IB (cT2, cN0, cM0, G2, ER+, PR+, HER2-) - Signed by Jeralyn Ruths, MD on 01/12/2021 Stage prefix: Initial diagnosis Histologic grading system: 3 grade system   01/21/2021 - 06/03/2021 Chemotherapy   Patient is on Treatment Plan : BREAST ADJUVANT DOSE DENSE AC q14d / PACLitaxel q7d      Genetic Testing   Negative genetic testing. No pathogenic variants identified on the [facility] Multi-Cancer+RNA panel. VUS in ATM called c.1838T>G, VUS in EGFR called c.2884C>G, VUS in POLD1 called c.1072C>T, and VUS in RUNX1 called c.872T>C identified. The report date is 02/13/2021.  The Multi-Cancer Panel +  RNA offered by [facility] includes sequencing and/or deletion duplication testing of the following 84 genes: AIP, ALK, APC, ATM, AXIN2,BAP1,  BARD1, BLM, BMPR1A, BRCA1, BRCA2, BRIP1, CASR, CDC73, CDH1, CDK4, CDKN1B, CDKN1C, CDKN2A (p14ARF), CDKN2A (p16INK4a), CEBPA, CHEK2, CTNNA1, DICER1, DIS3L2, EGFR (c.2369C>T, p.Thr790Met variant only), EPCAM (Deletion/duplication testing only), FH, FLCN, GATA2, GPC3, GREM1 (Promoter region deletion/duplication testing only), HOXB13 (c.251G>A, p.Gly84Glu), HRAS, KIT, MAX, MEN1, MET, MITF (c.952G>A, p.Glu318Lys variant only), MLH1, MSH2, MSH3, MSH6, MUTYH, NBN, NF1, NF2, NTHL1, PALB2, PDGFRA, PHOX2B, PMS2, POLD1, POLE, POT1, PRKAR1A, PTCH1, PTEN, RAD50, RAD51C, RAD51D, RB1, RECQL4, RET, RUNX1, SDHAF2, SDHA (sequence changes only), SDHB, SDHC, SDHD, SMAD4, SMARCA4, SMARCB1, SMARCE1, STK11, SUFU, TERC, TERT, TMEM127, TP53, TSC1, TSC2, VHL, WRN and WT1.     ALLERGIES:  is allergic to codeine.  MEDICATIONS:  Current Outpatient Medications  Medication Sig Dispense Refill   acetaminophen (TYLENOL) 325 MG tablet Take 650 mg by mouth every 6 (six) hours as needed.     amLODipine (NORVASC) 10 MG tablet Take 10 mg by mouth daily.     bacitracin ointment Apply 1 application topically 2 (two) times daily. 120 g 0   DULoxetine HCl 40 MG CPEP Take 40 mg by mouth daily. 60 capsule 5   gabapentin (NEURONTIN) 300 MG capsule TAKE 2 CAPSULES BY MOUTH TWICE DAILY 120 capsule 1   hydrochlorothiazide (HYDRODIURIL) 25 MG tablet Take 25 mg by mouth daily.     labetalol (NORMODYNE) 100 MG tablet TAKE ONE TABLET BY MOUTH TWICE DAILY 60 tablet 0   lidocaine-prilocaine (EMLA) cream Apply  to affected area once 30 g 3   tamoxifen (NOLVADEX) 20 MG tablet TAKE 1 TABLET BY MOUTH DAILY 90 tablet 3   diphenhydrAMINE HCl (BENADRYL ALLERGY PO) Take by mouth. (Patient not taking: Reported on 08/24/2022)     diphenhydrAMINE-zinc acetate (BENADRYL EXTRA STRENGTH) cream Apply 1 application topically 3  (three) times daily as needed for itching. (Patient not taking: Reported on 08/24/2022) 28.4 g 0   loratadine (CLARITIN) 10 MG tablet Take 10 mg by mouth daily. (Patient not taking: Reported on 05/25/2022)     ondansetron (ZOFRAN) 8 MG tablet Take 1 tablet (8 mg total) by mouth 2 (two) times daily. (Patient not taking: Reported on 04/21/2022) 60 tablet 2   prochlorperazine (COMPAZINE) 10 MG tablet Take 1 tablet (10 mg total) by mouth every 6 (six) hours as needed for nausea or vomiting. (Patient not taking: Reported on 11/11/2021) 60 tablet 2   Vitamin D, Ergocalciferol, 50000 units CAPS Take 1 capsule by mouth once a week. (Patient not taking: Reported on 05/25/2022)     No current facility-administered medications for this visit.    VITAL SIGNS: There were no vitals taken for this visit. There were no vitals filed for this visit.  Estimated body mass index is 22.96 kg/m as calculated from the following:   Height as of 05/25/22: 5\' 5"  (1.651 m).   Weight as of 05/25/22: 138 lb (62.6 kg).  LABS: CBC:    Component Value Date/Time   WBC 4.0 08/24/2022 0830   WBC 4.5 09/27/2021 0844   HGB 14.6 08/24/2022 0830   HCT 42.1 08/24/2022 0830   PLT 234 08/24/2022 0830   MCV 102.7 (H) 08/24/2022 0830   NEUTROABS 2.4 08/24/2022 0830   LYMPHSABS 1.2 08/24/2022 0830   MONOABS 0.3 08/24/2022 0830   EOSABS 0.0 08/24/2022 0830   BASOSABS 0.0 08/24/2022 0830   Comprehensive Metabolic Panel:    Component Value Date/Time   NA 134 (L) 06/03/2021 0836   K 3.5 06/03/2021 0836   CL 103 06/03/2021 0836   CO2 23 06/03/2021 0836   BUN 7 06/03/2021 0836   CREATININE 0.73 06/03/2021 0836   GLUCOSE 125 (H) 06/03/2021 0836   CALCIUM 8.8 (L) 06/03/2021 0836   AST 21 06/03/2021 0836   ALT 12 06/03/2021 0836   ALKPHOS 68 06/03/2021 0836   BILITOT 0.1 (L) 06/03/2021 0836   PROT 6.8 06/03/2021 0836   ALBUMIN 3.5 06/03/2021 0836    RADIOGRAPHIC STUDIES: No results found.  PERFORMANCE STATUS (ECOG) : 0 -  Asymptomatic  Review of Systems Unless otherwise noted, a complete review of systems is negative.  Physical Exam General: NAD Cardiovascular: regular rate and rhythm Pulmonary: clear ant fields Abdomen: soft, nontender, + bowel sounds GU: no suprapubic tenderness Extremities: no edema, no joint deformities, patient has palpable along the biceps cordlike structure Skin: no rashes Neurological: nonfocal Breast exam: No redness, swelling, or masses palpated right breast, small movable superficial cystic structure right axilla  Exam chaperoned by Vivien Presto, RN    IMPRESSION/PLAN:   R. Arm pain -cordlike structure right axilla/bicep area.  Will send for ultrasound to rule out DVT.  Could be lymphatic cord.  Will have patient evaluated by OT.  No clear evidence of infection, but considering recent laceration in that extremity, will empirically cover with doxycycline.  Case and plan discussed with Dr. Orlie Dakin.  Hypertension -patient consistently with elevated blood pressure despite multiple antihypertensives.  Strongly recommended patient contact PCP for further management.  Hypokalemia -borderline.  Recommended  increased potassium rich foods.  ADDENDUM: - US revealed small DVT R. Brachial vein. Also small axillary cyst. Discussed with Dr. Orlie Dakin and Rx sent for Eliquis starter pack. Dr. Orlie Dakin to see patient next week. Ok to hold doxy. Instructions reviewed with patient by phone.   Patient expressed understanding and was in agreement with this plan. She also understands that She can call clinic at any time with any questions, concerns, or complaints.   Thank you for allowing me to participate in the care of this very pleasant patient.   Time Total: 20 minutes  Visit consisted of counseling and education dealing with the complex and emotionally intense issues of symptom management in the setting of serious illness.Greater than 50%  of this time was spent counseling and  coordinating care related to the above assessment and plan.  Signed by: Laurette Schimke, PhD, NP-C

## 2022-08-24 NOTE — Therapy (Signed)
[facility] at [facility] [address], [address] [place], [place], [address] Phone: [phone]   Fax:  [phone]  Occupational Therapy Screen:  Patient Details  Name: Briana Soto MRN: [id number] Date of Birth: 1976-01-18 No data recorded  Encounter Date: 08/24/2022    Past Medical History:  Diagnosis Date   Anxiety    Headache    Hypertension    Noncompliance     Past Surgical History:  Procedure Laterality Date   BREAST BIOPSY Right 12/24/2020   mass, 9:00 8 cmfn venus marker, [facility]   BREAST BIOPSY Left 12/24/2020   mass 3:00 retroareolar, ribbon, PREDOMINANTLY KERATINACEOUS DEBRIS.   BREAST BIOPSY Left 12/24/2020   mass 3:00 5cmfn, heart marker, INVASIVE MAMMARY CARCINOMA   BREAST BIOPSY Left 12/24/2020   axilla LN, hyrdomarker shape 3 coil, METASTATIC CARCINOMA   BREAST LUMPECTOMY WITH RADIOFREQUENCY TAG IDENTIFICATION Right 06/24/2021   BREAST LUMPECTOMY,RADIO FREQ LOCALIZER,AXILLARY SENTINEL LYMPH NODE BIOPSY Right 07/05/2021   Procedure: BREAST LUMPECTOMY,RADIO FREQ LOCALIZER,AXILLARY SENTINEL LYMPH NODE BIOPSY;  Surgeon: Campbell Lerner, MD;  Location: [facility] ORS;  Service: General;  Laterality: Right;   MASTECTOMY MODIFIED RADICAL Left 07/05/2021   Procedure: MASTECTOMY MODIFIED RADICAL;  Surgeon: Campbell Lerner, MD;  Location: [facility] ORS;  Service: General;  Laterality: Left;   PORTACATH PLACEMENT Right 01/01/2021   Procedure: INSERTION PORT-A-CATH;  Surgeon: Campbell Lerner, MD;  Location: [facility] ORS;  Service: General;  Laterality: Right;   TUBAL LIGATION      There were no vitals filed for this visit.   Subjective Assessment - 08/24/22 1515     Subjective  It started hurting just reasonly - it hurts when I reach out to the side - into my elbow    Currently in Pain? Yes    Pain Score 2     Pain Location Arm    Pain Orientation Right                  Josh Borders NP 08/24/22 IMPRESSION/PLAN:   R. Arm  pain -cordlike structure right axilla/bicep area.  Will send for ultrasound to rule out DVT.  Could be lymphatic cord.  Will have patient evaluated by OT.  No clear evidence of infection, but considering recent laceration in that extremity, will empirically cover with doxycycline.  Case and plan discussed with Dr. Orlie Dakin.   Hypertension -patient consistently with elevated blood pressure despite multiple antihypertensives.  Strongly recommended patient contact PCP for further management.   Hypokalemia -borderline.  Recommended increased potassium rich foods.   OT SCREEN 08/24/22: Pt seen for pain in R axilla - lymphatic or venous cord into upper arm to elbow for few weeks -  appear like lymphatic cord- but pt out of lumpectomy and radiation months - Pt work at [employer] - using R arm a lot -but in front of her - agree with NP recommend [place] to rule out DVT. [place] came back positive for DVT. Will be place on medication for 3 months-                                Visit Diagnosis: Pain in right upper arm    Problem List Patient Active Problem List   Diagnosis Date Noted   Chemotherapy-induced neuropathy (HCC) 10/22/2021   Bilateral breast cancer (HCC) 07/05/2021   Genetic testing 02/23/2021   Invasive ductal carcinoma of left breast (HCC) 01/12/2021   Invasive ductal  carcinoma of right breast (HCC) 01/12/2021   Hypertensive urgency 11/29/2017   Hypokalemia 11/29/2017   Tachycardia 11/29/2017   Anxiety 09/29/2012   Essential hypertension 09/29/2012   Tobacco use 09/29/2012    Oletta Cohn, OTR/L,CLT 08/24/2022, 3:16 PM  [facility] at [facility] [address], [address] [place], [place], [address] Phone: [phone]   Fax:  [phone]  Name: Briana Soto MRN: [id number] Date of Birth: Nov 02, 1975

## 2022-08-24 NOTE — Patient Instructions (Addendum)
You are not on any vitamin D/calcium supplements. Tamoxifen can cause bone loss. We would like you to take over the counter oscal or caltrate 600+ D daily.  Please contact your primary care provider to manage your blood pressure and elevated glucose levels.

## 2022-09-01 ENCOUNTER — Inpatient Hospital Stay (HOSPITAL_BASED_OUTPATIENT_CLINIC_OR_DEPARTMENT_OTHER): Payer: Medicaid Other | Admitting: Oncology

## 2022-09-01 ENCOUNTER — Encounter: Payer: Self-pay | Admitting: Oncology

## 2022-09-01 DIAGNOSIS — C50811 Malignant neoplasm of overlapping sites of right female breast: Secondary | ICD-10-CM | POA: Diagnosis not present

## 2022-09-01 DIAGNOSIS — C50912 Malignant neoplasm of unspecified site of left female breast: Secondary | ICD-10-CM

## 2022-09-01 MED ORDER — LIDOCAINE-PRILOCAINE 2.5-2.5 % EX CREA
TOPICAL_CREAM | CUTANEOUS | 3 refills | Status: DC
Start: 2022-09-01 — End: 2022-11-09

## 2022-09-01 MED ORDER — APIXABAN 5 MG PO TABS: 5.0000 mg | ORAL_TABLET | Freq: Two times a day (BID) | ORAL | 2 refills | Status: DC

## 2022-09-01 NOTE — Progress Notes (Signed)
C/o itching and whelps since started blood thinner last week.

## 2022-09-01 NOTE — Progress Notes (Signed)
[facility]  [phone] [phone]  ID: Briana Soto OB: Aug 31, 1975  MR#: [id number]  AOZ#:[id number]  Patient Care Team: Benetta Spar, MD as PCP - General (Internal Medicine) Jim Like, RN as Registered Nurse Scarlett Presto, RN (Inactive) as Registered Nurse Carmina Miller, MD as Consulting Physician (Radiation Oncology) Jeralyn Ruths, MD as Consulting Physician (Oncology) Campbell Lerner, MD as Consulting Physician (General Surgery)  CHIEF COMPLAINT: Bilateral ER/PR positive, HER2 negative breast cancer.  INTERVAL HISTORY: Patient returns to clinic today as an add-on after being diagnosed with superficial right arm blood clot last week.  Her tenderness is improved.  She otherwise feels well.  She continues to tolerate tamoxifen without significant side effects.  She does not complain of peripheral neuropathy today.  She has no other neurologic complaints.  She denies any recent fevers or illnesses.  She has a good appetite and denies weight loss.  She has no chest pain, shortness of breath, cough, or hemoptysis.  She denies any nausea, vomiting, constipation, or diarrhea.  She has no urinary complaints.  Patient no further specific complaints today.  REVIEW OF SYSTEMS:   Review of Systems  Constitutional: Negative.  Negative for chills, fever and malaise/fatigue.  Respiratory: Negative.  Negative for cough, hemoptysis and shortness of breath.   Cardiovascular: Negative.  Negative for chest pain and leg swelling.  Gastrointestinal: Negative.  Negative for abdominal pain and nausea.  Genitourinary: Negative.  Negative for dysuria.  Musculoskeletal: Negative.  Negative for back pain.  Skin: Negative.  Negative for rash.  Neurological: Negative.  Negative for dizziness, tingling, sensory change, focal weakness, weakness and headaches.  Psychiatric/Behavioral: Negative.  The patient is not nervous/anxious and does not  have insomnia.     As per HPI. Otherwise, a complete review of systems is negative.  PAST MEDICAL HISTORY: Past Medical History:  Diagnosis Date   Anxiety    Headache    Hypertension    Noncompliance     PAST SURGICAL HISTORY: Past Surgical History:  Procedure Laterality Date   BREAST BIOPSY Right 12/24/2020   mass, 9:00 8 cmfn venus marker, [facility]   BREAST BIOPSY Left 12/24/2020   mass 3:00 retroareolar, ribbon, PREDOMINANTLY KERATINACEOUS DEBRIS.   BREAST BIOPSY Left 12/24/2020   mass 3:00 5cmfn, heart marker, INVASIVE MAMMARY CARCINOMA   BREAST BIOPSY Left 12/24/2020   axilla LN, hyrdomarker shape 3 coil, METASTATIC CARCINOMA   BREAST LUMPECTOMY WITH RADIOFREQUENCY TAG IDENTIFICATION Right 06/24/2021   BREAST LUMPECTOMY,RADIO FREQ LOCALIZER,AXILLARY SENTINEL LYMPH NODE BIOPSY Right 07/05/2021   Procedure: BREAST LUMPECTOMY,RADIO FREQ LOCALIZER,AXILLARY SENTINEL LYMPH NODE BIOPSY;  Surgeon: Campbell Lerner, MD;  Location: [facility] ORS;  Service: General;  Laterality: Right;   MASTECTOMY MODIFIED RADICAL Left 07/05/2021   Procedure: MASTECTOMY MODIFIED RADICAL;  Surgeon: Campbell Lerner, MD;  Location: [facility] OR[facility];  Service: General;  Laterality: Left;   PORTACATH PLACEMENT Right 01/01/2021   Procedure: INSERTION PORT-A-CATH;  Surgeon: Campbell Lerner, MD;  Location: [facility] ORS;  Service: General;  Laterality: Right;   TUBAL LIGATION      FAMILY HISTORY: Family History  Problem Relation Age of Onset   COPD Mother    Hypertension Mother    Heart disease Father     ADVANCED DIRECTIVES (Y/N):  N  HEALTH MAINTENANCE: Social History   Tobacco Use   Smoking status: Every Day    Current packs/day: 1.50    Types: Cigarettes   Smokeless tobacco: Never  Vaping Use   Vaping status: Never Used  Substance Use Topics   Alcohol use: Yes    Alcohol/week: 14.0 standard drinks of alcohol    Types: 14 Cans of beer per week    Comment: at least 2 beers daily   Drug use: No      Colonoscopy:  PAP:  Bone density:  Lipid panel:  Allergies  Allergen Reactions   Codeine Itching and Nausea And Vomiting    Current Outpatient Medications  Medication Sig Dispense Refill   acetaminophen (TYLENOL) 325 MG tablet Take 650 mg by mouth every 6 (six) hours as needed.     amLODipine (NORVASC) 10 MG tablet Take 10 mg by mouth daily.     APIXABAN (ELIQUIS) VTE STARTER PACK (10MG  AND 5MG ) Take as directed on package: start with two-5mg  tablets twice daily for 7 days. On day 8, switch to one-5mg  tablet twice daily. 74 each 0   bacitracin ointment Apply 1 application topically 2 (two) times daily. 120 g 0   DULoxetine HCl 40 MG CPEP Take 40 mg by mouth daily. 60 capsule 5   gabapentin (NEURONTIN) 300 MG capsule TAKE 2 CAPSULES BY MOUTH TWICE DAILY 120 capsule 1   hydrochlorothiazide (HYDRODIURIL) 25 MG tablet Take 25 mg by mouth daily.     labetalol (NORMODYNE) 100 MG tablet TAKE ONE TABLET BY MOUTH TWICE DAILY 60 tablet 0   lidocaine-prilocaine (EMLA) cream Apply to affected area once 30 g 3   tamoxifen (NOLVADEX) 20 MG tablet TAKE 1 TABLET BY MOUTH DAILY 90 tablet 3   diphenhydrAMINE HCl (BENADRYL ALLERGY PO) Take by mouth. (Patient not taking: Reported on 08/24/2022)     diphenhydrAMINE-zinc acetate (BENADRYL EXTRA STRENGTH) cream Apply 1 application topically 3 (three) times daily as needed for itching. (Patient not taking: Reported on 08/24/2022) 28.4 g 0   loratadine (CLARITIN) 10 MG tablet Take 10 mg by mouth daily. (Patient not taking: Reported on 05/25/2022)     ondansetron (ZOFRAN) 8 MG tablet Take 1 tablet (8 mg total) by mouth 2 (two) times daily. (Patient not taking: Reported on 04/21/2022) 60 tablet 2   prochlorperazine (COMPAZINE) 10 MG tablet Take 1 tablet (10 mg total) by mouth every 6 (six) hours as needed for nausea or vomiting. (Patient not taking: Reported on 11/11/2021) 60 tablet 2   Vitamin D, Ergocalciferol, 50000 units CAPS Take 1 capsule by mouth once a week.  (Patient not taking: Reported on 05/25/2022)     No current facility-administered medications for this visit.    OBJECTIVE: Vitals:   09/01/22 1102  BP: (!) 147/114  Pulse: 86  Temp: (!) 96.9 F (36.1 C)  SpO2: 100%      Body mass index is 20.83 kg/m.    ECOG FS:0 - Asymptomatic  General: Well-developed, well-nourished, no acute distress. Eyes: Pink conjunctiva, anicteric sclera. HEENT: Normocephalic, moist mucous membranes. Lungs: No audible wheezing or coughing. Heart: Regular rate and rhythm. Abdomen: Soft, nontender, no obvious distention. Musculoskeletal: No edema, cyanosis, or clubbing. Neuro: Alert, answering all questions appropriately. Cranial nerves grossly intact. Skin: No rashes or petechiae noted. Psych: Normal affect.  LAB RESULTS:  Lab Results  Component Value Date   NA 136 08/24/2022   K 3.4 (L) 08/24/2022   CL 101 08/24/2022   CO2 27 08/24/2022   GLUCOSE 130 (H) 08/24/2022   BUN 8 08/24/2022   CREATININE 0.67 08/24/2022   CALCIUM 9.3 08/24/2022   PROT 7.7 08/24/2022   ALBUMIN 4.1 08/24/2022   AST 22 08/24/2022   ALT 25 08/24/2022  ALKPHOS 67 08/24/2022   BILITOT 0.5 08/24/2022   GFRNONAA >60 08/24/2022   GFRAA >60 12/01/2017    Lab Results  Component Value Date   WBC 4.0 08/24/2022   NEUTROABS 2.4 08/24/2022   HGB 14.6 08/24/2022   HCT 42.1 08/24/2022   MCV 102.7 (H) 08/24/2022   PLT 234 08/24/2022     STUDIES: US Venous Img Upper Uni Right  Result Date: 08/24/2022 CLINICAL DATA:  Right arm and axillary pain for 2-3 days, history of breast cancer currently on tamoxifen EXAM: RIGHT UPPER EXTREMITY VENOUS DOPPLER ULTRASOUND TECHNIQUE: Gray-scale sonography with graded compression, as well as color Doppler and duplex ultrasound were performed to evaluate the upper extremity deep venous system from the level of the subclavian vein and including the jugular, axillary, basilic, radial, ulnar and upper cephalic vein. Spectral Doppler was  utilized to evaluate flow at rest and with distal augmentation maneuvers. COMPARISON:  None Available. FINDINGS: Contralateral Subclavian Vein: Respiratory phasicity is normal and symmetric with the symptomatic side. No evidence of thrombus. Normal compressibility. Internal Jugular Vein: No evidence of thrombus. Normal compressibility, respiratory phasicity and response to augmentation. Subclavian Vein: No evidence of thrombus. Normal compressibility, respiratory phasicity and response to augmentation. Axillary Vein: No evidence of thrombus. Normal compressibility, respiratory phasicity and response to augmentation. Cephalic Vein: No evidence of thrombus. Normal compressibility, respiratory phasicity and response to augmentation. Basilic Vein: No evidence of thrombus. Normal compressibility, respiratory phasicity and response to augmentation. Brachial Veins: There is occlusive thrombus within 1 of the right brachial veins. No evidence of thrombus within the second brachial vein. Radial Veins: No evidence of thrombus. Normal compressibility, respiratory phasicity and response to augmentation. Ulnar Veins: No evidence of thrombus. Normal compressibility, respiratory phasicity and response to augmentation. Venous Reflux:  None visualized. Other Findings: Targeted right axillary ultrasound was performed in the area of palpable abnormality. This demonstrates a superficial intradermal oval circumscribed hypoechoic mass. It measures 9 x 3 x 6 mm. A similar intradermal mass was imaged on January 26, 2022 and it is possible this is the same area. IMPRESSION: 1. There is occlusive thrombus within 1 of the right brachial veins. No additional thrombus is visualized in the right upper extremity. 2. There is a 9 mm intradermal hypoechoic mass corresponding to the area of palpable concern. A similar intradermal mass was imaged on January 26, 2022, consistent with an epidermal inclusion cyst. Recommend correlation with physical  exam and consider further evaluation with dermatology consultation. These results will be called to the ordering clinician or representative by the Radiologist Assistant, and communication documented in the PACS or [employer]. Electronically Signed   By: Jacob Moores M.D.   On: 08/24/2022 14:55    ASSESSMENT: Bilateral ER/PR positive, HER2 negative breast cancer.  PLAN:    Bilateral ER/PR positive, HER2 negative breast cancer: Patient completed neoadjuvant chemotherapy using Adriamycin, Cytoxan, followed by weekly Taxol on June 03, 2021.  She subsequently underwent left mastectomy, and right lumpectomy with residual malignancy in both breasts.  Right side had negative lymph nodes, left axillary dissection revealed 2 of 26 lymph nodes positive for disease.  Patient completed her adjuvant XRT in approximately September 2023.  She initiated tamoxifen and will take a minimum of 5 years of treatment completing in September 2028, but given her high risk disease may extend treatment to 7 to 10 years.  Patient's most recent mammogram on January 26, 2022 was reported as BI-RADS 2.  Repeat in December 2024.  Return to clinic as  previously scheduled in mid October for routine evaluation.   Anxiety/insomnia: Patient does not complain of this today.  Continue management per primary care.  Hypertension: Blood pressure remains persistently elevated.  Continue evaluation and treatment per primary care. Peripheral neuropathy: Patient does not have this today.  Patient currently on Cymbalta and gabapentin.  Continue follow-up with primary care. Right arm blood clot: Typically superficial blood clots do not need anticoagulation, but given patient is symptomatic have recommended 3 months of Eliquis.  She was given a prescription today and instructed to continue treatment until her appointment in mid October at which time we will discontinue anticoagulation.  I spent a total of 30 minutes reviewing chart data,  face-to-face evaluation with the patient, counseling and coordination of care as detailed above.    Patient expressed understanding and was in agreement with this plan. She also understands that She can call clinic at any time with any questions, concerns, or complaints.    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HCC) Staging form: Breast, AJCC 8th Edition - Clinical stage from 01/12/2021: Stage IIB (cT2, cN1, cM0, G3, ER+, PR+, HER2-) - Signed by Jeralyn Ruths, MD on 01/12/2021 Stage prefix: Initial diagnosis Histologic grading system: 3 grade system  Invasive ductal carcinoma of right breast [facility]) Staging form: Breast, AJCC 8th Edition - Clinical stage from 01/12/2021: Stage IB (cT2, cN0, cM0, G2, ER+, PR+, HER2-) - Signed by Jeralyn Ruths, MD on 01/12/2021 Stage prefix: Initial diagnosis Histologic grading system: 3 grade system  Jeralyn Ruths, MD   09/01/2022 12:02 PM

## 2022-09-28 ENCOUNTER — Inpatient Hospital Stay: Payer: Medicaid Other

## 2022-10-05 ENCOUNTER — Other Ambulatory Visit: Payer: Self-pay

## 2022-10-05 DIAGNOSIS — C50912 Malignant neoplasm of unspecified site of left female breast: Secondary | ICD-10-CM

## 2022-10-06 ENCOUNTER — Inpatient Hospital Stay: Payer: Medicaid Other | Attending: Oncology

## 2022-10-06 DIAGNOSIS — Z79810 Long term (current) use of selective estrogen receptor modulators (SERMs): Secondary | ICD-10-CM | POA: Diagnosis not present

## 2022-10-06 DIAGNOSIS — C50912 Malignant neoplasm of unspecified site of left female breast: Secondary | ICD-10-CM

## 2022-10-06 DIAGNOSIS — Z170 Estrogen receptor positive status [ER+]: Secondary | ICD-10-CM | POA: Diagnosis not present

## 2022-10-06 DIAGNOSIS — C50812 Malignant neoplasm of overlapping sites of left female breast: Secondary | ICD-10-CM | POA: Diagnosis present

## 2022-10-06 DIAGNOSIS — C50811 Malignant neoplasm of overlapping sites of right female breast: Secondary | ICD-10-CM | POA: Insufficient documentation

## 2022-10-06 LAB — CBC WITH DIFFERENTIAL (CANCER CENTER ONLY)
Abs Immature Granulocytes: 0.01 10*3/uL (ref 0.00–0.07)
Basophils Absolute: 0 10*3/uL (ref 0.0–0.1)
Basophils Relative: 0 %
Eosinophils Absolute: 0 10*3/uL (ref 0.0–0.5)
Eosinophils Relative: 0 %
HCT: 43.5 % (ref 36.0–46.0)
Hemoglobin: 15.3 g/dL — ABNORMAL HIGH (ref 12.0–15.0)
Immature Granulocytes: 0 %
Lymphocytes Relative: 28 %
Lymphs Abs: 1.5 10*3/uL (ref 0.7–4.0)
MCH: 34.5 pg — ABNORMAL HIGH (ref 26.0–34.0)
MCHC: 35.2 g/dL (ref 30.0–36.0)
MCV: 98.2 fL (ref 80.0–100.0)
Monocytes Absolute: 0.5 10*3/uL (ref 0.1–1.0)
Monocytes Relative: 9 %
Neutro Abs: 3.2 10*3/uL (ref 1.7–7.7)
Neutrophils Relative %: 63 %
Platelet Count: 177 10*3/uL (ref 150–400)
RBC: 4.43 MIL/uL (ref 3.87–5.11)
RDW: 11.2 % — ABNORMAL LOW (ref 11.5–15.5)
WBC Count: 5.2 10*3/uL (ref 4.0–10.5)
nRBC: 0 % (ref 0.0–0.2)

## 2022-10-06 LAB — CMP (CANCER CENTER ONLY)
ALT: 21 U/L (ref 0–44)
AST: 29 U/L (ref 15–41)
Albumin: 4.7 g/dL (ref 3.5–5.0)
Alkaline Phosphatase: 80 U/L (ref 38–126)
Anion gap: 13 (ref 5–15)
BUN: 6 mg/dL (ref 6–20)
CO2: 23 mmol/L (ref 22–32)
Calcium: 9 mg/dL (ref 8.9–10.3)
Chloride: 95 mmol/L — ABNORMAL LOW (ref 98–111)
Creatinine: 0.65 mg/dL (ref 0.44–1.00)
GFR, Estimated: >60 mL/min (ref >60)
Glucose, Bld: 75 mg/dL (ref 70–99)
Potassium: 3.5 mmol/L (ref 3.5–5.1)
Sodium: 131 mmol/L — ABNORMAL LOW (ref 135–145)
Total Bilirubin: 0.4 mg/dL (ref 0.3–1.2)
Total Protein: 8.4 g/dL — ABNORMAL HIGH (ref 6.5–8.1)

## 2022-10-06 MED ORDER — HEPARIN SOD (PORK) LOCK FLUSH 100 UNIT/ML IV SOLN
500.0000 [IU] | Freq: Once | INTRAVENOUS | Status: AC
  Administered 2022-10-06: 500 [IU] via INTRAVENOUS
  Filled 2022-10-06: qty 5

## 2022-10-06 MED ORDER — SODIUM CHLORIDE 0.9% FLUSH
10.0000 mL | INTRAVENOUS | Status: DC | PRN
  Administered 2022-10-06: 10 mL via INTRAVENOUS
  Filled 2022-10-06: qty 10

## 2022-11-09 ENCOUNTER — Other Ambulatory Visit: Payer: Self-pay | Admitting: *Deleted

## 2022-11-09 ENCOUNTER — Inpatient Hospital Stay: Payer: Medicaid Other

## 2022-11-09 DIAGNOSIS — C50912 Malignant neoplasm of unspecified site of left female breast: Secondary | ICD-10-CM

## 2022-11-09 MED ORDER — LIDOCAINE-PRILOCAINE 2.5-2.5 % EX CREA: TOPICAL_CREAM | CUTANEOUS | 3 refills | Status: AC

## 2022-11-17 ENCOUNTER — Inpatient Hospital Stay: Payer: Medicaid Other | Attending: Oncology

## 2022-11-17 DIAGNOSIS — Z79810 Long term (current) use of selective estrogen receptor modulators (SERMs): Secondary | ICD-10-CM | POA: Diagnosis not present

## 2022-11-17 DIAGNOSIS — Z95828 Presence of other vascular implants and grafts: Secondary | ICD-10-CM

## 2022-11-17 DIAGNOSIS — Z452 Encounter for adjustment and management of vascular access device: Secondary | ICD-10-CM | POA: Insufficient documentation

## 2022-11-17 DIAGNOSIS — Z170 Estrogen receptor positive status [ER+]: Secondary | ICD-10-CM | POA: Diagnosis not present

## 2022-11-17 DIAGNOSIS — C50811 Malignant neoplasm of overlapping sites of right female breast: Secondary | ICD-10-CM | POA: Diagnosis present

## 2022-11-17 DIAGNOSIS — C50812 Malignant neoplasm of overlapping sites of left female breast: Secondary | ICD-10-CM | POA: Diagnosis present

## 2022-11-17 MED ORDER — SODIUM CHLORIDE 0.9% FLUSH
10.0000 mL | Freq: Once | INTRAVENOUS | Status: AC
  Administered 2022-11-17: 10 mL via INTRAVENOUS
  Filled 2022-11-17: qty 10

## 2022-11-17 MED ORDER — HEPARIN SOD (PORK) LOCK FLUSH 100 UNIT/ML IV SOLN
500.0000 [IU] | Freq: Once | INTRAVENOUS | Status: AC
  Administered 2022-11-17: 500 [IU] via INTRAVENOUS
  Filled 2022-11-17: qty 5

## 2022-11-24 ENCOUNTER — Ambulatory Visit: Payer: Medicaid Other | Admitting: Oncology

## 2022-12-01 ENCOUNTER — Telehealth: Payer: Self-pay | Admitting: *Deleted

## 2022-12-01 NOTE — Telephone Encounter (Signed)
Patient called requesting we send prescription to [facility] Fax # [phone]

## 2022-12-02 NOTE — Telephone Encounter (Signed)
Patient called back and said that she needs prescription for 6 bras, 2 cami's and a prosthesis

## 2022-12-02 NOTE — Telephone Encounter (Signed)
Call placed to patient and left message n her voice mail to let me know exactly what she needs and not just mastectomy supplies

## 2022-12-08 ENCOUNTER — Inpatient Hospital Stay (HOSPITAL_BASED_OUTPATIENT_CLINIC_OR_DEPARTMENT_OTHER): Payer: Medicaid Other | Admitting: Oncology

## 2022-12-08 ENCOUNTER — Inpatient Hospital Stay: Payer: Medicaid Other | Attending: Oncology

## 2022-12-08 ENCOUNTER — Encounter: Payer: Self-pay | Admitting: Oncology

## 2022-12-08 VITALS — BP 120/97 | HR 94 | Temp 96.5°F | Resp 16 | Ht 65.0 in | Wt 119.0 lb

## 2022-12-08 DIAGNOSIS — Z7901 Long term (current) use of anticoagulants: Secondary | ICD-10-CM | POA: Insufficient documentation

## 2022-12-08 DIAGNOSIS — Z95828 Presence of other vascular implants and grafts: Secondary | ICD-10-CM

## 2022-12-08 DIAGNOSIS — F419 Anxiety disorder, unspecified: Secondary | ICD-10-CM | POA: Diagnosis not present

## 2022-12-08 DIAGNOSIS — C50812 Malignant neoplasm of overlapping sites of left female breast: Secondary | ICD-10-CM | POA: Insufficient documentation

## 2022-12-08 DIAGNOSIS — I10 Essential (primary) hypertension: Secondary | ICD-10-CM | POA: Insufficient documentation

## 2022-12-08 DIAGNOSIS — Z170 Estrogen receptor positive status [ER+]: Secondary | ICD-10-CM

## 2022-12-08 DIAGNOSIS — F17210 Nicotine dependence, cigarettes, uncomplicated: Secondary | ICD-10-CM | POA: Insufficient documentation

## 2022-12-08 DIAGNOSIS — C50811 Malignant neoplasm of overlapping sites of right female breast: Secondary | ICD-10-CM | POA: Diagnosis present

## 2022-12-08 DIAGNOSIS — C50912 Malignant neoplasm of unspecified site of left female breast: Secondary | ICD-10-CM | POA: Diagnosis not present

## 2022-12-08 DIAGNOSIS — I748 Embolism and thrombosis of other arteries: Secondary | ICD-10-CM | POA: Insufficient documentation

## 2022-12-08 DIAGNOSIS — G629 Polyneuropathy, unspecified: Secondary | ICD-10-CM | POA: Diagnosis not present

## 2022-12-08 DIAGNOSIS — C50911 Malignant neoplasm of unspecified site of right female breast: Secondary | ICD-10-CM

## 2022-12-08 MED ORDER — HEPARIN SOD (PORK) LOCK FLUSH 100 UNIT/ML IV SOLN
500.0000 [IU] | Freq: Once | INTRAVENOUS | Status: AC
  Administered 2022-12-08: 500 [IU] via INTRAVENOUS
  Filled 2022-12-08: qty 5

## 2022-12-08 MED ORDER — SODIUM CHLORIDE 0.9% FLUSH
10.0000 mL | Freq: Once | INTRAVENOUS | Status: AC
  Administered 2022-12-08: 10 mL via INTRAVENOUS
  Filled 2022-12-08: qty 10

## 2022-12-08 NOTE — Progress Notes (Signed)
[facility]  Telephone:([phone]) [phone] Fax:(336) [phone]  ID: Briana Soto OB: 06/23/1975  MR#: [id number]  OZD#:[id number]  Patient Care Team: Benetta Spar, MD as PCP - General (Internal Medicine) Jim Like, RN as Registered Nurse Scarlett Presto, RN (Inactive) as Registered Nurse Carmina Miller, MD as Consulting Physician (Radiation Oncology) Jeralyn Ruths, MD as Consulting Physician (Oncology) Campbell Lerner, MD as Consulting Physician (General Surgery)  CHIEF COMPLAINT: Bilateral ER/PR positive, HER2 negative breast cancer.  INTERVAL HISTORY: Patient returns to clinic today for routine evaluation.  She no longer has pain in her right arm at the site of her superficial blood clot.  Patient states she discontinued Eliquis secondary to a dental procedure, but is unclear when.  She continues to take tamoxifen and tolerating treatment well.  She has no neurologic complaints.  She denies any recent fevers or illnesses.  She has a good appetite and denies weight loss.  She has no chest pain, shortness of breath, cough, or hemoptysis.  She denies any nausea, vomiting, constipation, or diarrhea.  She has no urinary complaints.  Patient offers no specific complaints today.  REVIEW OF SYSTEMS:   Review of Systems  Constitutional: Negative.  Negative for chills, fever and malaise/fatigue.  Respiratory: Negative.  Negative for cough, hemoptysis and shortness of breath.   Cardiovascular: Negative.  Negative for chest pain and leg swelling.  Gastrointestinal: Negative.  Negative for abdominal pain and nausea.  Genitourinary: Negative.  Negative for dysuria.  Musculoskeletal: Negative.  Negative for back pain.  Skin: Negative.  Negative for rash.  Neurological: Negative.  Negative for dizziness, tingling, sensory change, focal weakness, weakness and headaches.  Psychiatric/Behavioral: Negative.  The patient is not nervous/anxious and does not have  insomnia.     As per HPI. Otherwise, a complete review of systems is negative.  PAST MEDICAL HISTORY: Past Medical History:  Diagnosis Date   Anxiety    Headache    Hypertension    Noncompliance     PAST SURGICAL HISTORY: Past Surgical History:  Procedure Laterality Date   BREAST BIOPSY Right 12/24/2020   mass, 9:00 8 cmfn venus marker, [facility]   BREAST BIOPSY Left 12/24/2020   mass 3:00 retroareolar, ribbon, PREDOMINANTLY KERATINACEOUS DEBRIS.   BREAST BIOPSY Left 12/24/2020   mass 3:00 5cmfn, heart marker, INVASIVE MAMMARY CARCINOMA   BREAST BIOPSY Left 12/24/2020   axilla LN, hyrdomarker shape 3 coil, METASTATIC CARCINOMA   BREAST LUMPECTOMY WITH RADIOFREQUENCY TAG IDENTIFICATION Right 06/24/2021   BREAST LUMPECTOMY,RADIO FREQ LOCALIZER,AXILLARY SENTINEL LYMPH NODE BIOPSY Right 07/05/2021   Procedure: BREAST LUMPECTOMY,RADIO FREQ LOCALIZER,AXILLARY SENTINEL LYMPH NODE BIOPSY;  Surgeon: Campbell Lerner, MD;  Location: [facility] ORS;  Service: General;  Laterality: Right;   MASTECTOMY MODIFIED RADICAL Left 07/05/2021   Procedure: MASTECTOMY MODIFIED RADICAL;  Surgeon: Campbell Lerner, MD;  Location: [facility];  Service: General;  Laterality: Left;   PORTACATH PLACEMENT Right 01/01/2021   Procedure: INSERTION PORT-A-CATH;  Surgeon: Campbell Lerner, MD;  Location: [facility];  Service: General;  Laterality: Right;   TUBAL LIGATION      FAMILY HISTORY: Family History  Problem Relation Age of Onset   COPD Mother    Hypertension Mother    Heart disease Father     ADVANCED DIRECTIVES (Y/N):  N  HEALTH MAINTENANCE: Social History   Tobacco Use   Smoking status: Every Day    Current packs/day: 1.50    Types: Cigarettes   Smokeless tobacco: Never  Vaping Use   Vaping status:  Never Used  Substance Use Topics   Alcohol use: Yes    Alcohol/week: 14.0 standard drinks of alcohol    Types: 14 Cans of beer per week    Comment: at least 2 beers daily   Drug use: No      Colonoscopy:  PAP:  Bone density:  Lipid panel:  Allergies  Allergen Reactions   Codeine Itching and Nausea And Vomiting    Current Outpatient Medications  Medication Sig Dispense Refill   amLODipine (NORVASC) 10 MG tablet Take 10 mg by mouth daily.     apixaban (ELIQUIS) 5 MG TABS tablet Take 1 tablet (5 mg total) by mouth 2 (two) times daily. 60 tablet 2   APIXABAN (ELIQUIS) VTE STARTER PACK (10MG  AND 5MG ) Take as directed on package: start with two-5mg  tablets twice daily for 7 days. On day 8, switch to one-5mg  tablet twice daily. 74 each 0   diphenhydrAMINE HCl (BENADRYL ALLERGY PO) Take by mouth.     DULoxetine HCl 40 MG CPEP Take 40 mg by mouth daily. 60 capsule 5   gabapentin (NEURONTIN) 300 MG capsule TAKE 2 CAPSULES BY MOUTH TWICE DAILY 120 capsule 1   hydrochlorothiazide (HYDRODIURIL) 25 MG tablet Take 25 mg by mouth daily.     labetalol (NORMODYNE) 100 MG tablet TAKE ONE TABLET BY MOUTH TWICE DAILY 60 tablet 0   lidocaine-prilocaine (EMLA) cream Apply to affected area once 30 g 3   tamoxifen (NOLVADEX) 20 MG tablet TAKE 1 TABLET BY MOUTH DAILY 90 tablet 3   acetaminophen (TYLENOL) 325 MG tablet Take 650 mg by mouth every 6 (six) hours as needed. (Patient not taking: Reported on 12/08/2022)     bacitracin ointment Apply 1 application topically 2 (two) times daily. (Patient not taking: Reported on 12/08/2022) 120 g 0   diphenhydrAMINE-zinc acetate (BENADRYL EXTRA STRENGTH) cream Apply 1 application topically 3 (three) times daily as needed for itching. (Patient not taking: Reported on 08/24/2022) 28.4 g 0   loratadine (CLARITIN) 10 MG tablet Take 10 mg by mouth daily. (Patient not taking: Reported on 05/25/2022)     ondansetron (ZOFRAN) 8 MG tablet Take 1 tablet (8 mg total) by mouth 2 (two) times daily. (Patient not taking: Reported on 04/21/2022) 60 tablet 2   prochlorperazine (COMPAZINE) 10 MG tablet Take 1 tablet (10 mg total) by mouth every 6 (six) hours as needed for  nausea or vomiting. (Patient not taking: Reported on 11/11/2021) 60 tablet 2   Vitamin D, Ergocalciferol, 50000 units CAPS Take 1 capsule by mouth once a week. (Patient not taking: Reported on 05/25/2022)     No current facility-administered medications for this visit.    OBJECTIVE: Vitals:   12/08/22 1049  BP: (!) 120/97  Pulse: 94  Resp: 16  Temp: (!) 96.5 F (35.8 C)  SpO2: 97%      Body mass index is 19.8 kg/m.    ECOG FS:0 - Asymptomatic  General: Well-developed, well-nourished, no acute distress. Eyes: Pink conjunctiva, anicteric sclera. HEENT: Normocephalic, moist mucous membranes. Lungs: No audible wheezing or coughing. Heart: Regular rate and rhythm. Abdomen: Soft, nontender, no obvious distention. Musculoskeletal: No edema, cyanosis, or clubbing. Neuro: Alert, answering all questions appropriately. Cranial nerves grossly intact. Skin: No rashes or petechiae noted. Psych: Normal affect.  LAB RESULTS:  Lab Results  Component Value Date   NA 131 (L) 10/06/2022   K 3.5 10/06/2022   CL 95 (L) 10/06/2022   CO2 23 10/06/2022   GLUCOSE 75 10/06/2022  BUN 6 10/06/2022   CREATININE 0.65 10/06/2022   CALCIUM 9.0 10/06/2022   PROT 8.4 (H) 10/06/2022   ALBUMIN 4.7 10/06/2022   AST 29 10/06/2022   ALT 21 10/06/2022   ALKPHOS 80 10/06/2022   BILITOT 0.4 10/06/2022   GFRNONAA >60 10/06/2022   GFRAA >60 12/01/2017    Lab Results  Component Value Date   WBC 5.2 10/06/2022   NEUTROABS 3.2 10/06/2022   HGB 15.3 (H) 10/06/2022   HCT 43.5 10/06/2022   MCV 98.2 10/06/2022   PLT 177 10/06/2022     STUDIES: No results found.  ASSESSMENT: Bilateral ER/PR positive, HER2 negative breast cancer.  PLAN:    Bilateral ER/PR positive, HER2 negative breast cancer: Patient completed neoadjuvant chemotherapy using Adriamycin, Cytoxan, followed by weekly Taxol on June 03, 2021.  She subsequently underwent left mastectomy, and right lumpectomy with residual malignancy in both  breasts.  Right side had negative lymph nodes, left axillary dissection revealed 2 of 26 lymph nodes positive for disease.  Patient completed her adjuvant XRT in approximately September 2023.  She initiated tamoxifen and will take a minimum of 5 years of treatment completing in September 2028, but given her high risk disease may extend treatment to 7 to 10 years.  Patient's most recent mammogram on January 26, 2022 was reported as BI-RADS 2.  Repeat mammogram in December 2024.  Return to clinic in 6 months for routine evaluation.   Anxiety/insomnia: Patient does not complain of this today.  Continue management per primary care.  Hypertension: Patient appears to have improved control.  Continue evaluation and treatment per primary care. Peripheral neuropathy: Patient not complain of this today.  Unclear of her compliance with Cymbalta and gabapentin.  Continue follow-up with primary care. Right arm blood clot: Typically superficial blood clots do not need anticoagulation, but given patient was symptomatic previously recommended 3 months of Eliquis.  Unclear how long patient took treatment.  No further anticoagulation or intervention is needed.     Patient expressed understanding and was in agreement with this plan. She also understands that She can call clinic at any time with any questions, concerns, or complaints.    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HCC) Staging form: Breast, AJCC 8th Edition - Clinical stage from 01/12/2021: Stage IIB (cT2, cN1, cM0, G3, ER+, PR+, HER2-) - Signed by Jeralyn Ruths, MD on 01/12/2021 Stage prefix: Initial diagnosis Histologic grading system: 3 grade system  Invasive ductal carcinoma of right breast [facility]) Staging form: Breast, AJCC 8th Edition - Clinical stage from 01/12/2021: Stage IB (cT2, cN0, cM0, G2, ER+, PR+, HER2-) - Signed by Jeralyn Ruths, MD on 01/12/2021 Stage prefix: Initial diagnosis Histologic grading system: 3  grade system  Jeralyn Ruths, MD   12/08/2022 10:59 AM

## 2022-12-15 ENCOUNTER — Encounter: Payer: Self-pay | Admitting: Surgery

## 2022-12-15 ENCOUNTER — Ambulatory Visit: Payer: Medicaid Other | Admitting: Surgery

## 2022-12-15 VITALS — BP 126/90 | HR 86 | Temp 98.0°F | Ht 65.0 in | Wt 115.6 lb

## 2022-12-15 DIAGNOSIS — Z452 Encounter for adjustment and management of vascular access device: Secondary | ICD-10-CM | POA: Diagnosis not present

## 2022-12-15 DIAGNOSIS — C50912 Malignant neoplasm of unspecified site of left female breast: Secondary | ICD-10-CM

## 2022-12-15 NOTE — Patient Instructions (Signed)
Today we have removed your Port in our office. Please see information below regarding this.  You are free to shower tomorrow, do not scrub the area.  Your incision was closed with Dermabond.  It is best to keep it clean and dry, it will tolerate a brief shower, but do not soak it or apply any creams or lotions to the incisions.  The Dermabond should gradually flake off over time.  Keep it open to air so you can evaluate your incisions.  Dermabond assists the underlying sutures to keep your incision closed and protected from infection.  Should you develop some drainage from your incision, some drops of drainage would be okay but if it persists continue to put keep a dry dressing over it.   You have glue on your skin and sutures under the skin. The glue will come off on it's own in 10-14 days. You may shower normally until this occurs but do not submerge.  Please use Tylenol or Ibuprofen for pain as needed. You may use ice to the area 3-4 times today and tomorrow for any achiness.   You may continue your regular activities right away but if you are having pain while doing something, stop what you are doing and try this activity once again in 3 days. Please call our office with any questions or concerns prior to your appointment.     Implanted Port Removal Implanted port removal is a procedure to remove the port and catheter (port-a-cath) that is implanted under your skin. The port is a small disc under your skin that can be punctured with a needle. It is connected to a vein in your chest or neck by a small flexible tube (catheter). The port-a-cath is used for treatment through an IV tube and for taking blood samples. Your health care provider will remove the port-a-cath if: You no longer need it for treatment. It is not working properly. The area around it gets infected.  Tell a health care provider about: Any allergies you have. All medicines you are taking, including vitamins, herbs, eye  drops, creams, and over-the-counter medicines. Any problems you or family members have had with anesthetic medicines. Any blood disorders you have. Any surgeries you have had. Any medical conditions you have. Whether you are pregnant or may be pregnant. What are the risks? Generally, this is a safe procedure. However, problems may occur, including: Infection. Bleeding. Allergic reactions to anesthetic medicines. Damage to nerves or blood vessels.

## 2022-12-15 NOTE — Progress Notes (Signed)
SURGICAL OPERATIVE REPORT  DATE OF PROCEDURE: 12/15/2022  ANESTHESIA: Local  PRE-OPERATIVE DIAGNOSIS: Tunneled right subclavian central venous catheter with subcutaneous port, no longer needed (icd-10: Y86.578)  POST-OPERATIVE DIAGNOSIS:  Same  PROCEDURE(S):  1.) Removal of indwelling right subclavian central venous catheter with subcutaneous port (cpt: 36590)  INTRAOPERATIVE FINDINGS: Well-incorporated tunneled right subclavian central venous catheter with subcutaneous port without erythema or purulence  INTRAVENOUS FLUIDS: 0 mL crystalloid   ESTIMATED BLOOD LOSS: Minimal (< 20 mL)  SPECIMEN/EXPLANT:   DRAINS: none  COMPLICATIONS: None apparent  CONDITION AT END OF PROCEDURE: Hemodynamically stable and awake  DISPOSITION OF PATIENT: Home.  INDICATIONS FOR PROCEDURE:  Patient is a  s/p complete utilization of a right subclavian central venous catheter with indwelling subcutaneous port presents for evaluation and removal of the no longer needed port. Patient reports the port was never infected and always functioned properly, but since it's reportedly no longer needed, and  wishes to have it removed. All risks, benefits, and alternatives to above procedure were discussed with the patient, all of patient's questions were answered to his expressed satisfaction, and informed consent was obtained and documented.  DETAILS OF PROCEDURE: Patient was brought to the procedure suite and appropriately identified. In supine position, operative site was prepped and draped in the usual sterile fashion, and following a brief time out, lidocaine was injected in the subcutaneous tissue overlying the port, a 2 cm incision was made through the previous scar to accommodate the port. A combination of blunt dissection, sharp dissection  were used to free the port and attached catheter from its surrounding capsule and tissues. The port was then able to be removed from its subcutaneous pocket, the catheter  was confirmed to slide freely, and the catheter was removed with immediate focal pressure applied to the venous insertion site for hemostasis. Hemostasis was then confirmed, the subcutaneous pocket was evaluated to ensure all/any previous used sutures were removed, and the wound was then closed in layers, using 3-0 Vicryl buried interrupted suture to re-approximate dermis. Additional local anesthetic was injected subcutaneously along the length of the incision, and a running subcuticular 4-0 Monocryl suture was used to re-approximate epidermis. Skin was then cleaned, dried, and sterile Dermabond was applied and allowed to dry.  Campbell Lerner, M.D., [facility]  12/15/2022 ; 9:48 AM

## 2023-01-31 ENCOUNTER — Ambulatory Visit
Admission: RE | Admit: 2023-01-31 | Discharge: 2023-01-31 | Disposition: A | Discharge status: Home / Self Care | Payer: Medicaid Other | Source: Ambulatory Visit | Attending: Oncology | Admitting: Oncology

## 2023-01-31 ENCOUNTER — Other Ambulatory Visit: Payer: Self-pay | Admitting: Oncology

## 2023-01-31 DIAGNOSIS — C50911 Malignant neoplasm of unspecified site of right female breast: Secondary | ICD-10-CM

## 2023-01-31 DIAGNOSIS — Z170 Estrogen receptor positive status [ER+]: Secondary | ICD-10-CM | POA: Diagnosis present

## 2023-01-31 DIAGNOSIS — Z95828 Presence of other vascular implants and grafts: Secondary | ICD-10-CM

## 2023-01-31 DIAGNOSIS — C50912 Malignant neoplasm of unspecified site of left female breast: Secondary | ICD-10-CM

## 2023-04-07 IMAGING — MG MM PLC BREAST LOC DEV 1ST LESION INC*R*
8 series · 8 of 8 positions shown · non-contrast
Comparison: None Available.

CLINICAL DATA: 45-year-old female with bilateral breast cancer
diagnosed in December 2020 status post neoadjuvant chemotherapy.
Patient is planning for left mastectomy and right breast lumpectomy.
She presents for tag localization of the right breast.

EXAM:
MAMMOGRAPHIC GUIDED RADIOFREQUENCY DEVICE
LOCALIZATION OF THE RIGHT BREAST

[R LM (1 of 4)]
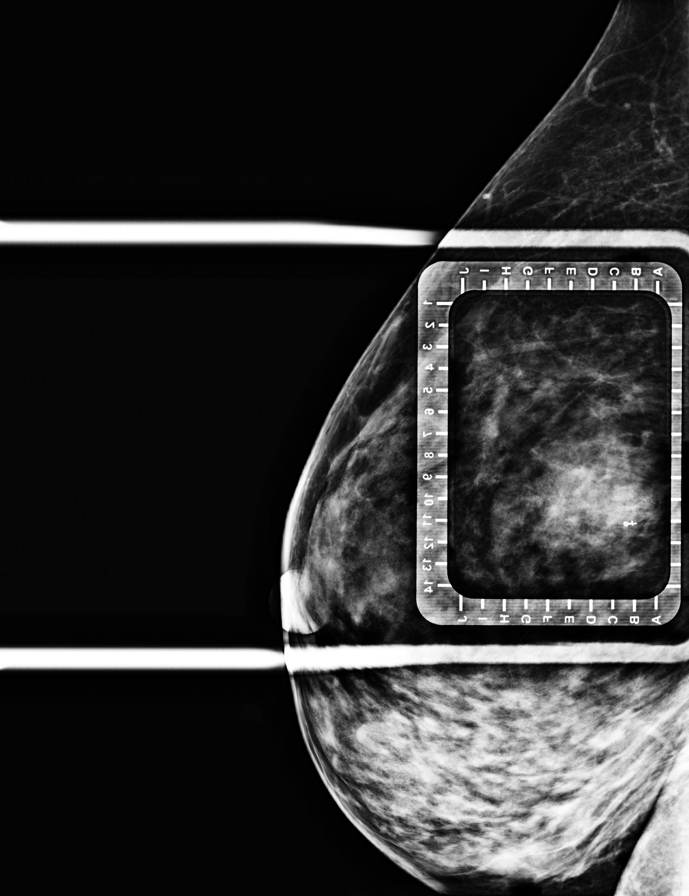

[R CC (1 of 4)]
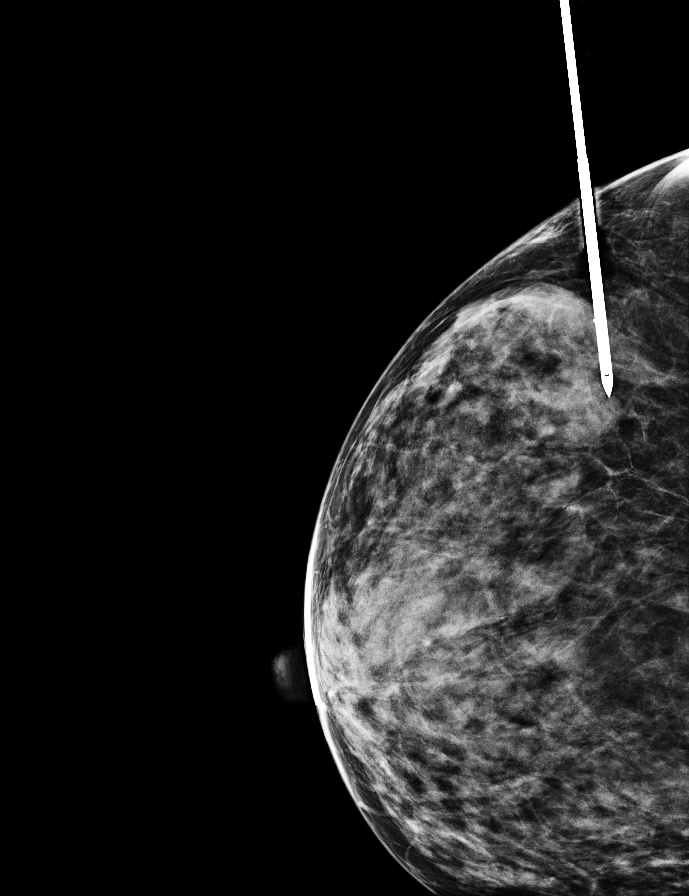

[R LM (2 of 4)]
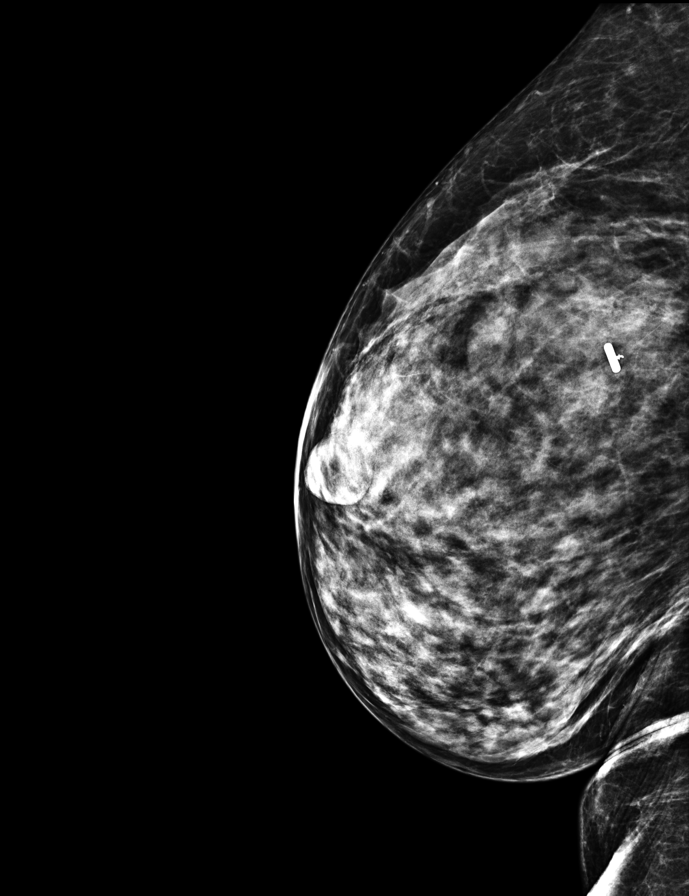

[R CC (2 of 4)]
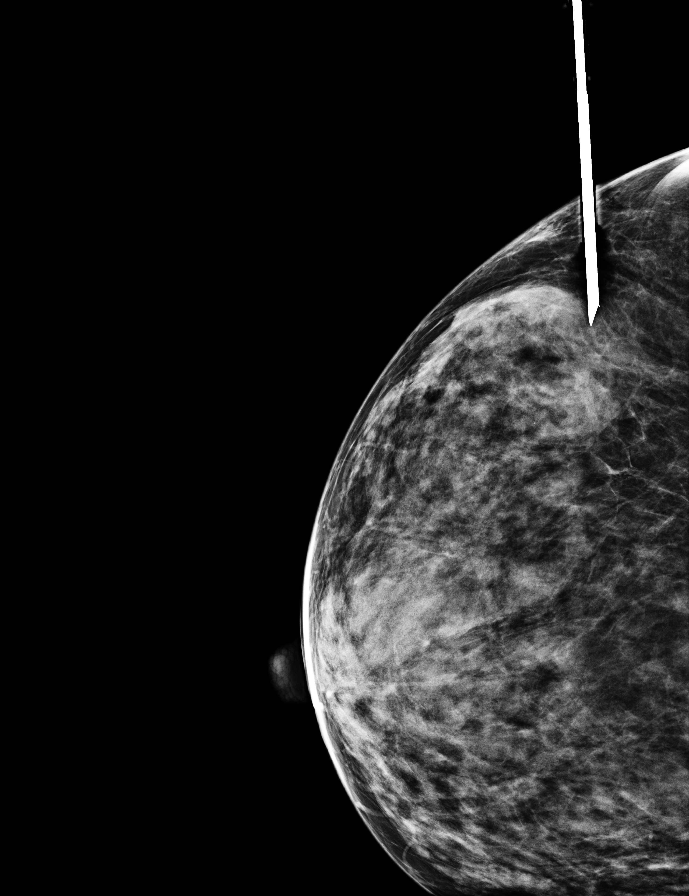

[R LM (3 of 4)]
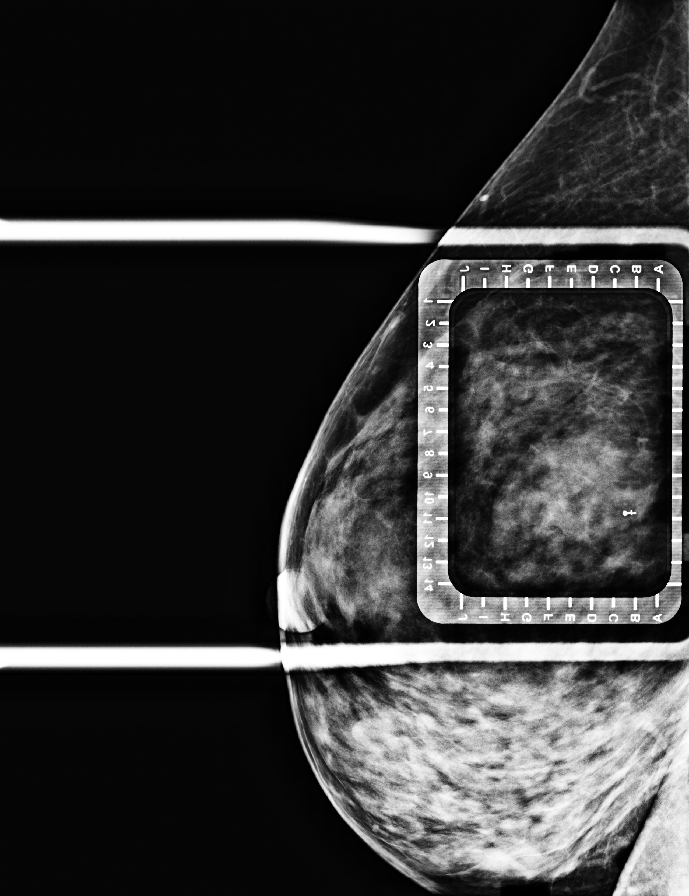

[R CC (3 of 4)]
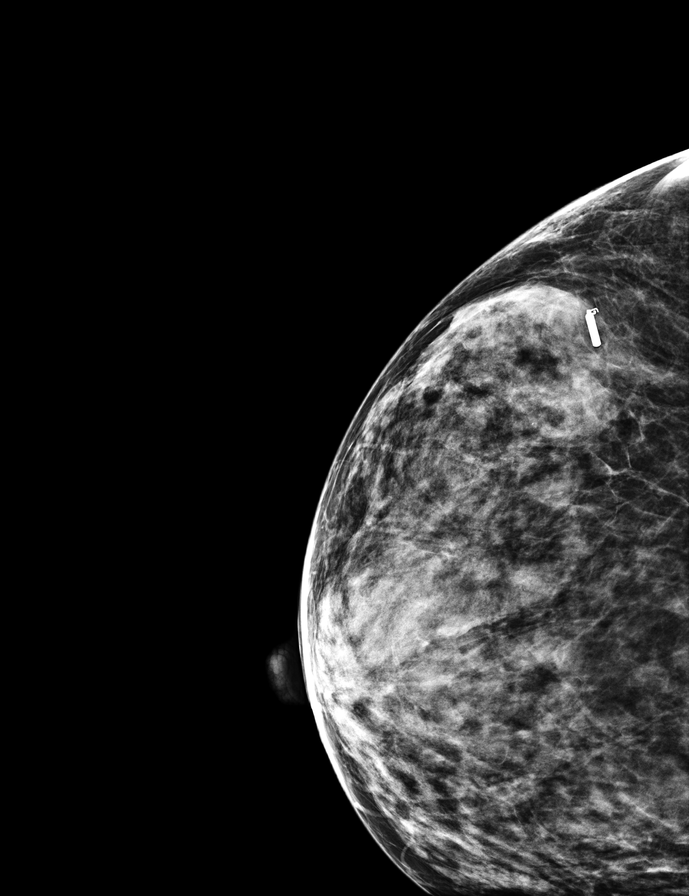

[R CC (4 of 4)]
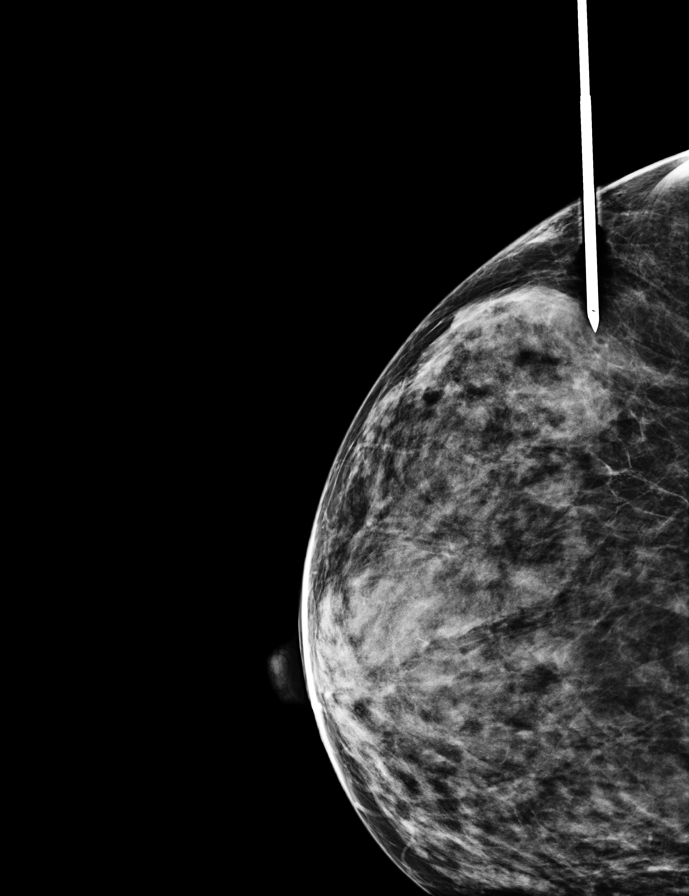

[R LM (4 of 4)]
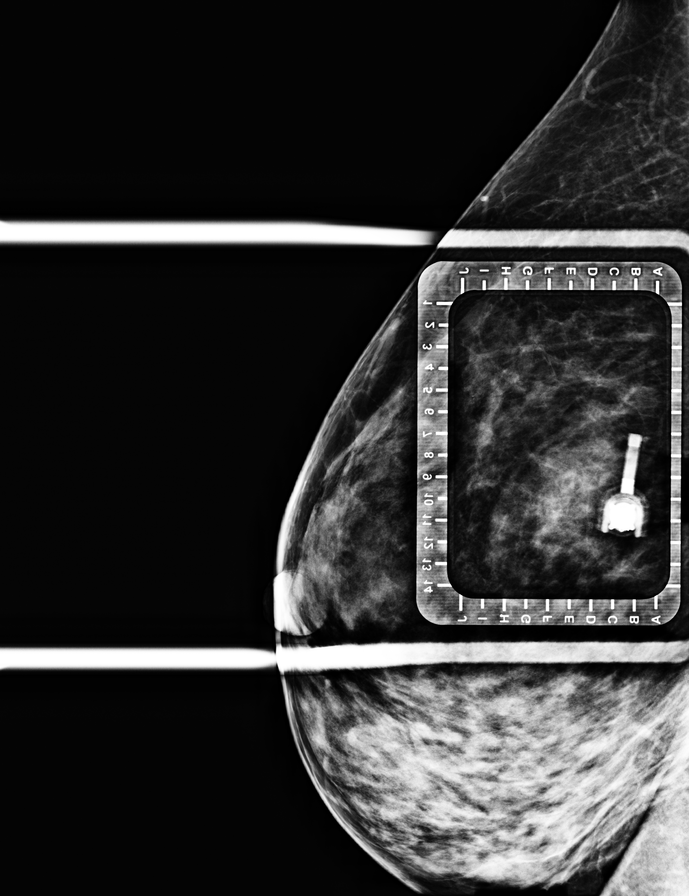

[8 of 8 positions shown; findings below may reference images not displayed]

FINDINGS: Patient presents for radiofrequency device localization prior to
right breast lumpectomy. I met with the patient and we discussed the
procedure of radiofrequency device localization including benefits
and alternatives. We discussed the high likelihood of a successful
procedure. We discussed the risks of the procedure including
infection, bleeding, tissue injury and further surgery. Informed,
written consent was given.

The usual time-out protocol was performed immediately prior to the
procedure.

Using mammographic guidance, sterile technique, 1% lidocaine as
local anesthesia, a radiofrequency tag was used to localize the
Venus clip in the upper outer right breast using a lateral approach.

The follow-up mammogram images confirm that the RF device is in the
expected location and are marked for Dr. Fritjof.

Follow-up survey of the patient confirms the presence of the RF
device.

The patient tolerated the procedure well and was released from the
[REDACTED].
IMPRESSION: Radiofrequency device localization of the RIGHT breast. No apparent
complications.

## 2023-04-20 ENCOUNTER — Ambulatory Visit
Admission: RE | Admit: 2023-04-20 | Discharge: 2023-04-20 | Disposition: A | Discharge status: Home / Self Care | Payer: Medicaid Other | Source: Ambulatory Visit | Attending: Radiation Oncology | Admitting: Radiation Oncology

## 2023-04-20 ENCOUNTER — Encounter: Payer: Self-pay | Admitting: Radiation Oncology

## 2023-04-20 VITALS — BP 162/126 | HR 99 | Temp 98.0°F | Resp 16 | Ht 65.0 in | Wt 126.9 lb

## 2023-04-20 DIAGNOSIS — C50912 Malignant neoplasm of unspecified site of left female breast: Secondary | ICD-10-CM | POA: Insufficient documentation

## 2023-04-20 DIAGNOSIS — C50911 Malignant neoplasm of unspecified site of right female breast: Secondary | ICD-10-CM | POA: Diagnosis present

## 2023-04-20 DIAGNOSIS — Z923 Personal history of irradiation: Secondary | ICD-10-CM | POA: Diagnosis not present

## 2023-04-20 DIAGNOSIS — C50811 Malignant neoplasm of overlapping sites of right female breast: Secondary | ICD-10-CM

## 2023-04-20 DIAGNOSIS — Z79810 Long term (current) use of selective estrogen receptor modulators (SERMs): Secondary | ICD-10-CM | POA: Diagnosis not present

## 2023-04-20 DIAGNOSIS — Z170 Estrogen receptor positive status [ER+]: Secondary | ICD-10-CM | POA: Diagnosis not present

## 2023-04-20 NOTE — Progress Notes (Signed)
 Radiation Oncology Follow up Note  Name: Briana Soto   Date:   04/20/2023 MRN:  [id number] DOB: [birth year]    This 48 y.o. female presents to the clinic today for 1 year follow-up status post adjuvant radiation therapy to right breast as well as her left chest wall for stage Ia invasive mammary carcinoma of the left breast and stage T2 N1 invasive mammary carcinoma status post left modified radical mastectomy.  REFERRING PROVIDER: Benetta Spar*  HPI: Patient is a 48 year old female who received right whole breast radiation as well as left chest wall radiation for above-stated malignancies.  She is now out 1 year and doing well specifically denies any chest wall tenderness new masses or nodularity cough or bone pain..  She is currently on tamoxifen tolerating it well without side effect.  She had mammogram of the right breast back in December showing again a right axillary benign epidermal inclusion cyst.  Overall BI-RADS Category 2.  COMPLICATIONS OF TREATMENT: none  FOLLOW UP COMPLIANCE: keeps appointments   PHYSICAL EXAM:  BP (!) 162/126 Comment: Pt sees PCP regarding her BP  Pulse 99   Temp 98 F (36.7 C) (Tympanic)   Resp 16   Ht 5\' 5"  (1.651 m)   Wt 126 lb 14.4 oz (57.6 kg)   BMI 21.12 kg/m  Right breast is free of dominant mass.  Left chest wall shows no evidence of mass or nodularity.  No axillary or supraclavicular adenopathy is appreciated.  No evidence of lymphedema in her left upper extremity is noted.  Well-developed well-nourished patient in NAD. HEENT reveals PERLA, EOMI, discs not visualized.  Oral cavity is clear. No oral mucosal lesions are identified. Neck is clear without evidence of cervical or supraclavicular adenopathy. Lungs are clear to A&P. Cardiac examination is essentially unremarkable with regular rate and rhythm without murmur rub or thrill. Abdomen is benign with no organomegaly or masses noted. Motor sensory and DTR levels are equal and  symmetric in the upper and lower extremities. Cranial nerves II through XII are grossly intact. Proprioception is intact. No peripheral adenopathy or edema is identified. No motor or sensory levels are noted. Crude visual fields are within normal range.  RADIOLOGY RESULTS: Mammogram reviewed compatible with above-stated findings  PLAN: The present time I am going to discontinue follow-up care.  She is seeing medical oncology twice a year and I believe that is sufficient.  I would be happy to reevaluate the patient in time the future should that be indicated.  Patient continues on tamoxifen without side effect.  She knows to call with any concerns.  I would like to take this opportunity to thank you for allowing me to participate in the care of your patient.Carmina Miller, MD

## 2023-06-08 ENCOUNTER — Inpatient Hospital Stay: Payer: Medicaid Other | Admitting: Oncology

## 2023-06-28 ENCOUNTER — Inpatient Hospital Stay: Admitting: Oncology

## 2023-07-04 ENCOUNTER — Telehealth: Payer: Self-pay | Admitting: Oncology

## 2023-07-04 NOTE — Telephone Encounter (Signed)
 Briana Soto left message with answering services about her appt tomorrow with Dr Adrian Alba. She has a dentist appt at the same time as Dr El Gravely appt. Can she come later? Schedulers were notified.

## 2023-07-05 ENCOUNTER — Encounter: Payer: Self-pay | Admitting: Oncology

## 2023-07-05 ENCOUNTER — Inpatient Hospital Stay: Attending: Oncology | Admitting: Oncology

## 2023-07-05 VITALS — BP 152/116 | HR 80 | Temp 97.5°F | Resp 16 | Ht 65.0 in | Wt 130.1 lb

## 2023-07-05 DIAGNOSIS — Z79810 Long term (current) use of selective estrogen receptor modulators (SERMs): Secondary | ICD-10-CM | POA: Diagnosis not present

## 2023-07-05 DIAGNOSIS — Z9012 Acquired absence of left breast and nipple: Secondary | ICD-10-CM | POA: Insufficient documentation

## 2023-07-05 DIAGNOSIS — I10 Essential (primary) hypertension: Secondary | ICD-10-CM | POA: Diagnosis not present

## 2023-07-05 DIAGNOSIS — Z170 Estrogen receptor positive status [ER+]: Secondary | ICD-10-CM | POA: Diagnosis not present

## 2023-07-05 DIAGNOSIS — F17210 Nicotine dependence, cigarettes, uncomplicated: Secondary | ICD-10-CM | POA: Diagnosis not present

## 2023-07-05 DIAGNOSIS — G629 Polyneuropathy, unspecified: Secondary | ICD-10-CM | POA: Insufficient documentation

## 2023-07-05 DIAGNOSIS — F419 Anxiety disorder, unspecified: Secondary | ICD-10-CM | POA: Insufficient documentation

## 2023-07-05 DIAGNOSIS — C50812 Malignant neoplasm of overlapping sites of left female breast: Secondary | ICD-10-CM | POA: Insufficient documentation

## 2023-07-05 DIAGNOSIS — G4700 Insomnia, unspecified: Secondary | ICD-10-CM | POA: Insufficient documentation

## 2023-07-05 DIAGNOSIS — Z923 Personal history of irradiation: Secondary | ICD-10-CM | POA: Diagnosis not present

## 2023-07-05 DIAGNOSIS — Z86718 Personal history of other venous thrombosis and embolism: Secondary | ICD-10-CM | POA: Insufficient documentation

## 2023-07-05 DIAGNOSIS — C50811 Malignant neoplasm of overlapping sites of right female breast: Secondary | ICD-10-CM | POA: Insufficient documentation

## 2023-07-05 DIAGNOSIS — C50912 Malignant neoplasm of unspecified site of left female breast: Secondary | ICD-10-CM

## 2023-07-05 NOTE — Progress Notes (Signed)
 [facility]  [phone] [phone]  ID: Briana Soto OB: 07-13-1975  MR#: [id number]  AOZ#:[id number]  Patient Care Team: Wyvonna Heidelberg, MD as PCP - General (Internal Medicine) Burnie Cartwright, RN as Registered Nurse Arlette Benders, RN (Inactive) as Registered Nurse Glenis Langdon, MD as Consulting Physician (Radiation Oncology) Shellie Dials, MD as Consulting Physician (Oncology) Flynn Hylan, MD as Consulting Physician (General Surgery)  CHIEF COMPLAINT: Bilateral ER/PR positive, HER2 negative breast cancer.  INTERVAL HISTORY: Patient returns to clinic today for routine 37-month evaluation.  She currently feels well and is asymptomatic.  She is tolerating tamoxifen  well without significant side effects.  She continues to have neuropathy, but no other neurologic complaints.  She denies any recent fevers or illnesses.  She has a good appetite and denies weight loss.  She has no chest pain, shortness of breath, cough, or hemoptysis.  She denies any nausea, vomiting, constipation, or diarrhea.  She has no urinary complaints.  Patient offers no further specific complaints today.  REVIEW OF SYSTEMS:   Review of Systems  Constitutional: Negative.  Negative for chills, fever and malaise/fatigue.  Respiratory: Negative.  Negative for cough, hemoptysis and shortness of breath.   Cardiovascular: Negative.  Negative for chest pain and leg swelling.  Gastrointestinal: Negative.  Negative for abdominal pain and nausea.  Genitourinary: Negative.  Negative for dysuria.  Musculoskeletal: Negative.  Negative for back pain.  Skin: Negative.  Negative for rash.  Neurological: Negative.  Negative for dizziness, tingling, sensory change, focal weakness, weakness and headaches.  Psychiatric/Behavioral: Negative.  The patient is not nervous/anxious and does not have insomnia.     As per HPI. Otherwise, a complete review of systems is  negative.  PAST MEDICAL HISTORY: Past Medical History:  Diagnosis Date   Anxiety    Headache    Hypertension    Noncompliance     PAST SURGICAL HISTORY: Past Surgical History:  Procedure Laterality Date   BREAST BIOPSY Right 12/24/2020   mass, 9:00 8 cmfn venus marker, [facility]   BREAST BIOPSY Left 12/24/2020   mass 3:00 retroareolar, ribbon, PREDOMINANTLY KERATINACEOUS DEBRIS.   BREAST BIOPSY Left 12/24/2020   mass 3:00 5cmfn, heart marker, INVASIVE MAMMARY CARCINOMA   BREAST BIOPSY Left 12/24/2020   axilla LN, hyrdomarker shape 3 coil, METASTATIC CARCINOMA   BREAST LUMPECTOMY WITH RADIOFREQUENCY TAG IDENTIFICATION Right 06/24/2021   BREAST LUMPECTOMY,RADIO FREQ LOCALIZER,AXILLARY SENTINEL LYMPH NODE BIOPSY Right 07/05/2021   Procedure: BREAST LUMPECTOMY,RADIO FREQ LOCALIZER,AXILLARY SENTINEL LYMPH NODE BIOPSY;  Surgeon: Flynn Hylan, MD;  Location: [facility] ORS;  Service: General;  Laterality: Right;   MASTECTOMY MODIFIED RADICAL Left 07/05/2021   Procedure: MASTECTOMY MODIFIED RADICAL;  Surgeon: Flynn Hylan, MD;  Location: [facility] OR[facility];  Service: General;  Laterality: Left;   PORTACATH PLACEMENT Right 01/01/2021   Procedure: INSERTION PORT-A-CATH;  Surgeon: Flynn Hylan, MD;  Location: [facility] ORS;  Service: General;  Laterality: Right;   TUBAL LIGATION      FAMILY HISTORY: Family History  Problem Relation Age of Onset   COPD Mother    Hypertension Mother    Heart disease Father     ADVANCED DIRECTIVES (Y/N):  N  HEALTH MAINTENANCE: Social History   Tobacco Use   Smoking status: Every Day    Current packs/day: 1.50    Types: Cigarettes   Smokeless tobacco: Never  Vaping Use   Vaping status: Never Used  Substance Use Topics   Alcohol use: Yes    Alcohol/week: 14.0 standard drinks  of alcohol    Types: 14 Cans of beer per week    Comment: at least 2 beers daily   Drug use: No     Colonoscopy:  PAP:  Bone density:  Lipid panel:  Allergies  Allergen  Reactions   Codeine Itching and Nausea And Vomiting    Current Outpatient Medications  Medication Sig Dispense Refill   ALLERGY RELIEF 10 MG tablet Take 10 mg by mouth daily.     amLODipine  (NORVASC ) 10 MG tablet Take 10 mg by mouth daily.     diphenhydrAMINE  HCl (BENADRYL  ALLERGY PO) Take by mouth.     DULoxetine  (CYMBALTA ) 30 MG capsule Take 30 mg by mouth daily.     DULoxetine  HCl 40 MG CPEP Take 40 mg by mouth daily. 60 capsule 5   gabapentin  (NEURONTIN ) 300 MG capsule TAKE 2 CAPSULES BY MOUTH TWICE DAILY 120 capsule 1   hydrochlorothiazide  (HYDRODIURIL ) 25 MG tablet Take 25 mg by mouth daily.     labetalol  (NORMODYNE ) 100 MG tablet TAKE ONE TABLET BY MOUTH TWICE DAILY 60 tablet 0   lidocaine -prilocaine  (EMLA ) cream Apply to affected area once 30 g 3   potassium chloride  SA (KLOR-CON  M) 20 MEQ tablet Take 20 mEq by mouth daily.     tamoxifen  (NOLVADEX ) 20 MG tablet TAKE 1 TABLET BY MOUTH DAILY 90 tablet 3   No current facility-administered medications for this visit.    OBJECTIVE: Vitals:   07/05/23 1014  BP: (!) 152/116  Pulse: 80  Resp: 16  Temp: (!) 97.5 F (36.4 C)  SpO2: 99%      Body mass index is 21.65 kg/m.    ECOG FS:0 - Asymptomatic  General: Well-developed, well-nourished, no acute distress. Eyes: Pink conjunctiva, anicteric sclera. HEENT: Normocephalic, moist mucous membranes. Lungs: No audible wheezing or coughing. Heart: Regular rate and rhythm. Abdomen: Soft, nontender, no obvious distention. Musculoskeletal: No edema, cyanosis, or clubbing. Neuro: Alert, answering all questions appropriately. Cranial nerves grossly intact. Skin: No rashes or petechiae noted. Psych: Normal affect.  LAB RESULTS:  Lab Results  Component Value Date   NA 131 (L) 10/06/2022   K 3.5 10/06/2022   CL 95 (L) 10/06/2022   CO2 23 10/06/2022   GLUCOSE 75 10/06/2022   BUN 6 10/06/2022   CREATININE 0.65 10/06/2022   CALCIUM 9.0 10/06/2022   PROT 8.4 (H) 10/06/2022    ALBUMIN 4.7 10/06/2022   AST 29 10/06/2022   ALT 21 10/06/2022   ALKPHOS 80 10/06/2022   BILITOT 0.4 10/06/2022   GFRNONAA >60 10/06/2022   GFRAA >60 12/01/2017    Lab Results  Component Value Date   WBC 5.2 10/06/2022   NEUTROABS 3.2 10/06/2022   HGB 15.3 (H) 10/06/2022   HCT 43.5 10/06/2022   MCV 98.2 10/06/2022   PLT 177 10/06/2022     STUDIES: No results found.  ASSESSMENT: Bilateral ER/PR positive, HER2 negative breast cancer.  PLAN:    Bilateral ER/PR positive, HER2 negative breast cancer: Patient completed neoadjuvant chemotherapy using Adriamycin , Cytoxan , followed by weekly Taxol  on June 03, 2021.  She subsequently underwent left mastectomy, and right lumpectomy with residual malignancy in both breasts.  Right side had negative lymph nodes, left axillary dissection revealed 2 of 26 lymph nodes positive for disease.  Patient completed her adjuvant XRT in approximately September 2023.  She initiated tamoxifen  and will take a minimum of 5 years of treatment completing in September 2028, but given her high risk disease may extend treatment to 7 to  10 years.  Patient's most recent mammogram on January 31, 2023 was reported as BI-RADS 2.  She was noted to have a benign cyst in her right axilla.  No further intervention is needed.  Repeat mammogram in December 2025.  Return to clinic in 6 months for routine evaluation.  Anxiety/insomnia: Patient does not complain of this today.  Continue management per primary care.  Hypertension: Blood pressure is moderately elevated today.  Continue evaluation and treatment per primary care. Peripheral neuropathy: Patient states this is getting worse and has requested a referral back to Dr. Mark Sil for further management who initially prescribed her Cymbalta  and gabapentin . Right arm blood clot: Resolved.  No further anticoagulation or intervention is needed.     Patient expressed understanding and was in agreement with this plan. She also  understands that She can call clinic at any time with any questions, concerns, or complaints.    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HCC) Staging form: Breast, AJCC 8th Edition - Clinical stage from 01/12/2021: Stage IIB (cT2, cN1, cM0, G3, ER+, PR+, HER2-) - Signed by Shellie Dials, MD on 01/12/2021 Stage prefix: Initial diagnosis Histologic grading system: 3 grade system  Invasive ductal carcinoma of right breast [facility]) Staging form: Breast, AJCC 8th Edition - Clinical stage from 01/12/2021: Stage IB (cT2, cN0, cM0, G2, ER+, PR+, HER2-) - Signed by Shellie Dials, MD on 01/12/2021 Stage prefix: Initial diagnosis Histologic grading system: 3 grade system  Shellie Dials, MD   07/05/2023 10:33 AM

## 2023-07-05 NOTE — Progress Notes (Signed)
 Getting knots under right arm. States that she does not have pain or discomfort.

## 2023-07-28 ENCOUNTER — Inpatient Hospital Stay: Attending: Oncology | Admitting: Internal Medicine

## 2023-07-28 ENCOUNTER — Encounter: Payer: Self-pay | Admitting: Internal Medicine

## 2023-12-27 LAB — COLOGUARD: COLOGUARD: NEGATIVE

## 2024-01-05 ENCOUNTER — Encounter: Payer: Self-pay | Admitting: Oncology

## 2024-01-05 ENCOUNTER — Inpatient Hospital Stay: Attending: Oncology | Admitting: Oncology

## 2024-01-05 VITALS — BP 193/140 | HR 107 | Temp 98.7°F | Resp 20 | Wt 130.0 lb

## 2024-01-05 DIAGNOSIS — C50912 Malignant neoplasm of unspecified site of left female breast: Secondary | ICD-10-CM | POA: Diagnosis not present

## 2024-01-05 NOTE — Progress Notes (Signed)
 Patient's blood pressure was high, but she has to go pick up her medication.

## 2024-01-05 NOTE — Progress Notes (Signed)
 [facility]  [phone] [phone]  ID: Briana Soto OB: Mar 10, 1975  MR#: [id number]  RDW#:[id number]  Patient Care Team: Carlette Benita Area, MD as PCP - General (Internal Medicine) Cindie Jesusa HERO, RN as Registered Nurse Dannielle Arlean FALCON, RN (Inactive) as Registered Nurse Lenn Aran, MD as Consulting Physician (Radiation Oncology) Jacobo Evalene PARAS, MD as Consulting Physician (Oncology)  CHIEF COMPLAINT: Bilateral ER/PR positive, HER2 negative breast cancer.  INTERVAL HISTORY: Patient returns to clinic today for routine 39-month evaluation.  She recent completed school and is now employed as a occupational hygienist.  She continues to have hot flashes secondary to tamoxifen , but otherwise is tolerating treatment well.  She has no neurologic complaints.  She denies any recent fevers or illnesses.  She has a good appetite and denies weight loss.  She has no chest pain, shortness of breath, cough, or hemoptysis.  She denies any nausea, vomiting, constipation, or diarrhea.  She has no urinary complaints.  Patient offers no further specific complaints today.  REVIEW OF SYSTEMS:   Review of Systems  Constitutional: Negative.  Negative for chills, fever and malaise/fatigue.  Respiratory: Negative.  Negative for cough, hemoptysis and shortness of breath.   Cardiovascular: Negative.  Negative for chest pain and leg swelling.  Gastrointestinal: Negative.  Negative for abdominal pain and nausea.  Genitourinary: Negative.  Negative for dysuria.  Musculoskeletal: Negative.  Negative for back pain.  Skin: Negative.  Negative for rash.  Neurological:  Positive for sensory change. Negative for dizziness, tingling, focal weakness, weakness and headaches.  Psychiatric/Behavioral: Negative.  The patient is not nervous/anxious and does not have insomnia.     As per HPI. Otherwise, a complete review of systems is negative.  PAST MEDICAL  HISTORY: Past Medical History:  Diagnosis Date   Anxiety    Headache    Hypertension    Noncompliance     PAST SURGICAL HISTORY: Past Surgical History:  Procedure Laterality Date   BREAST BIOPSY Right 12/24/2020   mass, 9:00 8 cmfn venus marker, [facility]   BREAST BIOPSY Left 12/24/2020   mass 3:00 retroareolar, ribbon, PREDOMINANTLY KERATINACEOUS DEBRIS.   BREAST BIOPSY Left 12/24/2020   mass 3:00 5cmfn, heart marker, INVASIVE MAMMARY CARCINOMA   BREAST BIOPSY Left 12/24/2020   axilla LN, hyrdomarker shape 3 coil, METASTATIC CARCINOMA   BREAST LUMPECTOMY WITH RADIOFREQUENCY TAG IDENTIFICATION Right 06/24/2021   BREAST LUMPECTOMY,RADIO FREQ LOCALIZER,AXILLARY SENTINEL LYMPH NODE BIOPSY Right 07/05/2021   Procedure: BREAST LUMPECTOMY,RADIO FREQ LOCALIZER,AXILLARY SENTINEL LYMPH NODE BIOPSY;  Surgeon: Lane Shope, MD;  Location: [facility] OR[facility];  Service: General;  Laterality: Right;   MASTECTOMY MODIFIED RADICAL Left 07/05/2021   Procedure: MASTECTOMY MODIFIED RADICAL;  Surgeon: Lane Shope, MD;  Location: [facility] OR[facility];  Service: General;  Laterality: Left;   PORTACATH PLACEMENT Right 01/01/2021   Procedure: INSERTION PORT-A-CATH;  Surgeon: Lane Shope, MD;  Location: [facility] ORS;  Service: General;  Laterality: Right;   TUBAL LIGATION      FAMILY HISTORY: Family History  Problem Relation Age of Onset   COPD Mother    Hypertension Mother    Heart disease Father     ADVANCED DIRECTIVES (Y/N):  N  HEALTH MAINTENANCE: Social History   Tobacco Use   Smoking status: Every Day    Current packs/day: 1.50    Types: Cigarettes   Smokeless tobacco: Never  Vaping Use   Vaping status: Never Used  Substance Use Topics   Alcohol use: Yes    Alcohol/week: 14.0 standard drinks  of alcohol    Types: 14 Cans of beer per week    Comment: at least 2 beers daily   Drug use: No     Colonoscopy:  PAP:  Bone density:  Lipid panel:  Allergies  Allergen Reactions   Codeine Itching and  Nausea And Vomiting    Current Outpatient Medications  Medication Sig Dispense Refill   ALLERGY RELIEF 10 MG tablet Take 10 mg by mouth daily.     amLODipine  (NORVASC ) 10 MG tablet Take 10 mg by mouth daily.     diphenhydrAMINE  HCl (BENADRYL  ALLERGY PO) Take by mouth.     DULoxetine  (CYMBALTA ) 30 MG capsule Take 30 mg by mouth daily.     DULoxetine  HCl 40 MG CPEP Take 40 mg by mouth daily. 60 capsule 5   gabapentin  (NEURONTIN ) 300 MG capsule TAKE 2 CAPSULES BY MOUTH TWICE DAILY 120 capsule 1   hydrochlorothiazide  (HYDRODIURIL ) 25 MG tablet Take 25 mg by mouth daily.     labetalol  (NORMODYNE ) 100 MG tablet TAKE ONE TABLET BY MOUTH TWICE DAILY 60 tablet 0   lidocaine -prilocaine  (EMLA ) cream Apply to affected area once 30 g 3   potassium chloride  SA (KLOR-CON  M) 20 MEQ tablet Take 20 mEq by mouth daily.     tamoxifen  (NOLVADEX ) 20 MG tablet TAKE 1 TABLET BY MOUTH DAILY 90 tablet 3   No current facility-administered medications for this visit.    OBJECTIVE: Vitals:   01/05/24 1139  BP: (!) 193/140  Pulse: (!) 107  Resp: 20  Temp: 98.7 F (37.1 C)  SpO2: 100%      Body mass index is 21.63 kg/m.    ECOG FS:0 - Asymptomatic  General: Well-developed, well-nourished, no acute distress. Eyes: Pink conjunctiva, anicteric sclera. HEENT: Normocephalic, moist mucous membranes. Lungs: No audible wheezing or coughing. Heart: Regular rate and rhythm. Abdomen: Soft, nontender, no obvious distention. Musculoskeletal: No edema, cyanosis, or clubbing. Neuro: Alert, answering all questions appropriately. Cranial nerves grossly intact. Skin: No rashes or petechiae noted. Psych: Normal affect.  LAB RESULTS:  Lab Results  Component Value Date   NA 131 (L) 10/06/2022   K 3.5 10/06/2022   CL 95 (L) 10/06/2022   CO2 23 10/06/2022   GLUCOSE 75 10/06/2022   BUN 6 10/06/2022   CREATININE 0.65 10/06/2022   CALCIUM 9.0 10/06/2022   PROT 8.4 (H) 10/06/2022   ALBUMIN 4.7 10/06/2022   AST 29  10/06/2022   ALT 21 10/06/2022   ALKPHOS 80 10/06/2022   BILITOT 0.4 10/06/2022   GFRNONAA >60 10/06/2022   GFRAA >60 12/01/2017    Lab Results  Component Value Date   WBC 5.2 10/06/2022   NEUTROABS 3.2 10/06/2022   HGB 15.3 (H) 10/06/2022   HCT 43.5 10/06/2022   MCV 98.2 10/06/2022   PLT 177 10/06/2022     STUDIES: No results found.  ASSESSMENT: Bilateral ER/PR positive, HER2 negative breast cancer.  PLAN:    Bilateral ER/PR positive, HER2 negative breast cancer: Patient completed neoadjuvant chemotherapy using Adriamycin , Cytoxan , followed by weekly Taxol  on June 03, 2021.  She subsequently underwent left mastectomy, and right lumpectomy with residual malignancy in both breasts.  Right side had negative lymph nodes, left axillary dissection revealed 2 of 26 lymph nodes positive for disease.  Patient completed her adjuvant XRT in approximately September 2023.  She initiated tamoxifen  and will take a minimum of 5 years of treatment completing in September 2028, but given her high risk disease may extend treatment to 7 to  10 years.  Patient's most recent mammogram on January 31, 2023 was reported as BI-RADS 2.  Repeat mammogram in December 2025.  Return to clinic in 6 months for routine evaluation. Hot flashes: We discussed possibly switching treatments which patient declined.  She plans to try vitamin E and B complex vitamin. Hypertension: Blood pressure remains significantly elevated.  Patient reports she did not take her medication today.  Continue monitoring and treatment per primary care.   Peripheral neuropathy: Patient does not complain of this today.  Unclear if she is still taking Cymbalta  and gabapentin . Right arm blood clot: Resolved.  No further anticoagulation or intervention is needed.     Patient expressed understanding and was in agreement with this plan. She also understands that She can call clinic at any time with any questions, concerns, or complaints.     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HCC) Staging form: Breast, AJCC 8th Edition - Clinical stage from 01/12/2021: Stage IIB (cT2, cN1, cM0, G3, ER+, PR+, HER2-) - Signed by Jacobo Evalene PARAS, MD on 01/12/2021 Stage prefix: Initial diagnosis Histologic grading system: 3 grade system  Invasive ductal carcinoma of right breast [facility]) Staging form: Breast, AJCC 8th Edition - Clinical stage from 01/12/2021: Stage IB (cT2, cN0, cM0, G2, ER+, PR+, HER2-) - Signed by Jacobo Evalene PARAS, MD on 01/12/2021 Stage prefix: Initial diagnosis Histologic grading system: 3 grade system  Evalene PARAS Jacobo, MD   01/05/2024 1:41 PM

## 2024-02-09 ENCOUNTER — Ambulatory Visit
Admission: RE | Admit: 2024-02-09 | Discharge: 2024-02-09 | Disposition: A | Discharge status: Home / Self Care | Source: Ambulatory Visit | Attending: Oncology | Admitting: Oncology

## 2024-02-09 DIAGNOSIS — C50912 Malignant neoplasm of unspecified site of left female breast: Secondary | ICD-10-CM | POA: Insufficient documentation

## 2024-07-09 ENCOUNTER — Inpatient Hospital Stay: Admitting: Oncology
